# Patient Record
Sex: Male | Born: 1940 | Race: White | Hispanic: No | Marital: Married | Smoking: Current some day smoker
Health system: Southern US, Community
[De-identification: ages and names within clinical notes are randomized; demographics above are authoritative.]

## PROBLEM LIST (undated history)

## (undated) DIAGNOSIS — M169 Osteoarthritis of hip, unspecified: Secondary | ICD-10-CM

## (undated) DIAGNOSIS — G4734 Idiopathic sleep related nonobstructive alveolar hypoventilation: Secondary | ICD-10-CM

## (undated) DIAGNOSIS — I4892 Unspecified atrial flutter: Secondary | ICD-10-CM

## (undated) DIAGNOSIS — Z8619 Personal history of other infectious and parasitic diseases: Secondary | ICD-10-CM

## (undated) DIAGNOSIS — I2510 Atherosclerotic heart disease of native coronary artery without angina pectoris: Secondary | ICD-10-CM

## (undated) DIAGNOSIS — C801 Malignant (primary) neoplasm, unspecified: Secondary | ICD-10-CM

## (undated) DIAGNOSIS — I739 Peripheral vascular disease, unspecified: Secondary | ICD-10-CM

## (undated) DIAGNOSIS — I10 Essential (primary) hypertension: Secondary | ICD-10-CM

## (undated) DIAGNOSIS — F17210 Nicotine dependence, cigarettes, uncomplicated: Secondary | ICD-10-CM

## (undated) DIAGNOSIS — I499 Cardiac arrhythmia, unspecified: Secondary | ICD-10-CM

## (undated) DIAGNOSIS — F17200 Nicotine dependence, unspecified, uncomplicated: Secondary | ICD-10-CM

## (undated) DIAGNOSIS — J45909 Unspecified asthma, uncomplicated: Secondary | ICD-10-CM

## (undated) DIAGNOSIS — M1990 Unspecified osteoarthritis, unspecified site: Secondary | ICD-10-CM

## (undated) DIAGNOSIS — F419 Anxiety disorder, unspecified: Secondary | ICD-10-CM

## (undated) DIAGNOSIS — I4821 Permanent atrial fibrillation: Secondary | ICD-10-CM

## (undated) DIAGNOSIS — Z8679 Personal history of other diseases of the circulatory system: Secondary | ICD-10-CM

## (undated) DIAGNOSIS — K219 Gastro-esophageal reflux disease without esophagitis: Secondary | ICD-10-CM

## (undated) DIAGNOSIS — E785 Hyperlipidemia, unspecified: Secondary | ICD-10-CM

## (undated) DIAGNOSIS — J449 Chronic obstructive pulmonary disease, unspecified: Secondary | ICD-10-CM

## (undated) DIAGNOSIS — R05 Cough: Secondary | ICD-10-CM

## (undated) DIAGNOSIS — R0602 Shortness of breath: Secondary | ICD-10-CM

## (undated) DIAGNOSIS — R0989 Other specified symptoms and signs involving the circulatory and respiratory systems: Secondary | ICD-10-CM

## (undated) DIAGNOSIS — R55 Syncope and collapse: Secondary | ICD-10-CM

## (undated) HISTORY — PX: OTHER SURGICAL HISTORY: SHX169

## (undated) HISTORY — DX: Occlusion and stenosis of bilateral carotid arteries: I65.23

## (undated) HISTORY — PX: CERVICAL DISC SURGERY: SHX588

## (undated) HISTORY — DX: Cough: R05

## (undated) HISTORY — PX: CATARACT EXTRACTION: SUR2

## (undated) HISTORY — DX: Syncope and collapse: R55

## (undated) HISTORY — DX: Nicotine dependence, cigarettes, uncomplicated: F17.210

## (undated) HISTORY — PX: SINUS SURGERY WITH INSTATRAK: SHX5215

## (undated) HISTORY — DX: Personal history of other infectious and parasitic diseases: Z86.19

## (undated) HISTORY — DX: Anxiety disorder, unspecified: F41.9

## (undated) HISTORY — PX: BACK SURGERY: SHX140

## (undated) HISTORY — DX: Personal history of other diseases of the circulatory system: Z86.79

## (undated) HISTORY — DX: Unspecified atrial flutter: I48.92

## (undated) HISTORY — DX: Permanent atrial fibrillation: I48.21

## (undated) HISTORY — DX: Other specified symptoms and signs involving the circulatory and respiratory systems: R09.89

## (undated) HISTORY — DX: Idiopathic sleep related nonobstructive alveolar hypoventilation: G47.34

## (undated) HISTORY — PX: VASCULAR SURGERY: SHX849

## (undated) HISTORY — DX: Hyperlipidemia, unspecified: E78.5

## (undated) HISTORY — DX: Unspecified atherosclerosis of native arteries of extremities, bilateral legs: I70.203

## (undated) HISTORY — DX: Osteoarthritis of hip, unspecified: M16.9

## (undated) HISTORY — DX: Nicotine dependence, unspecified, uncomplicated: F17.200

## (undated) HISTORY — DX: Chronic obstructive pulmonary disease, unspecified: J44.9

---

## 2005-08-22 ENCOUNTER — Inpatient Hospital Stay (HOSPITAL_COMMUNITY): Admission: RE | Admit: 2005-08-22 | Discharge: 2005-08-23 | Payer: Self-pay | Admitting: Neurological Surgery

## 2005-08-29 ENCOUNTER — Encounter: Admission: RE | Admit: 2005-08-29 | Discharge: 2005-08-29 | Payer: Self-pay | Admitting: Neurological Surgery

## 2005-09-22 ENCOUNTER — Encounter: Admission: RE | Admit: 2005-09-22 | Discharge: 2005-09-22 | Payer: Self-pay | Admitting: Neurological Surgery

## 2005-11-03 ENCOUNTER — Encounter: Admission: RE | Admit: 2005-11-03 | Discharge: 2005-11-03 | Payer: Self-pay | Admitting: Neurological Surgery

## 2006-01-05 ENCOUNTER — Encounter: Admission: RE | Admit: 2006-01-05 | Discharge: 2006-01-05 | Payer: Self-pay | Admitting: Neurological Surgery

## 2006-03-05 ENCOUNTER — Ambulatory Visit (HOSPITAL_COMMUNITY): Admission: RE | Admit: 2006-03-05 | Discharge: 2006-03-06 | Payer: Self-pay | Admitting: Neurological Surgery

## 2007-01-27 IMAGING — RF DG CERVICAL SPINE 2 OR 3 VIEWS
1 series · 2 of 2 positions shown · non-contrast
Comparison: none

CLINICAL DATA: Herniated cervical disks.
 CERVICAL SPINE   - 2 VIEWS ? 08/22/05:
 Two lateral C-arm images of the cervical spine taken in the operating room.

[Series 1: run · 2 of 2 slices shown]
[im 1/2]
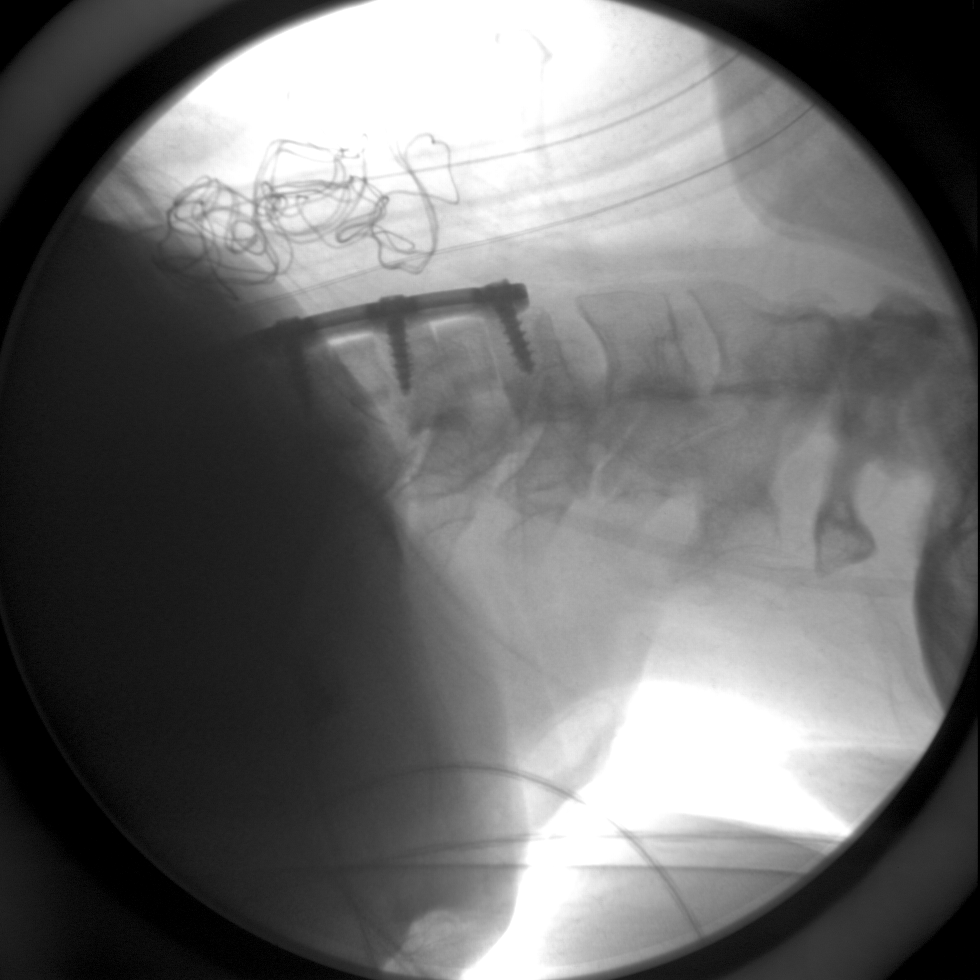
[im 2/2]
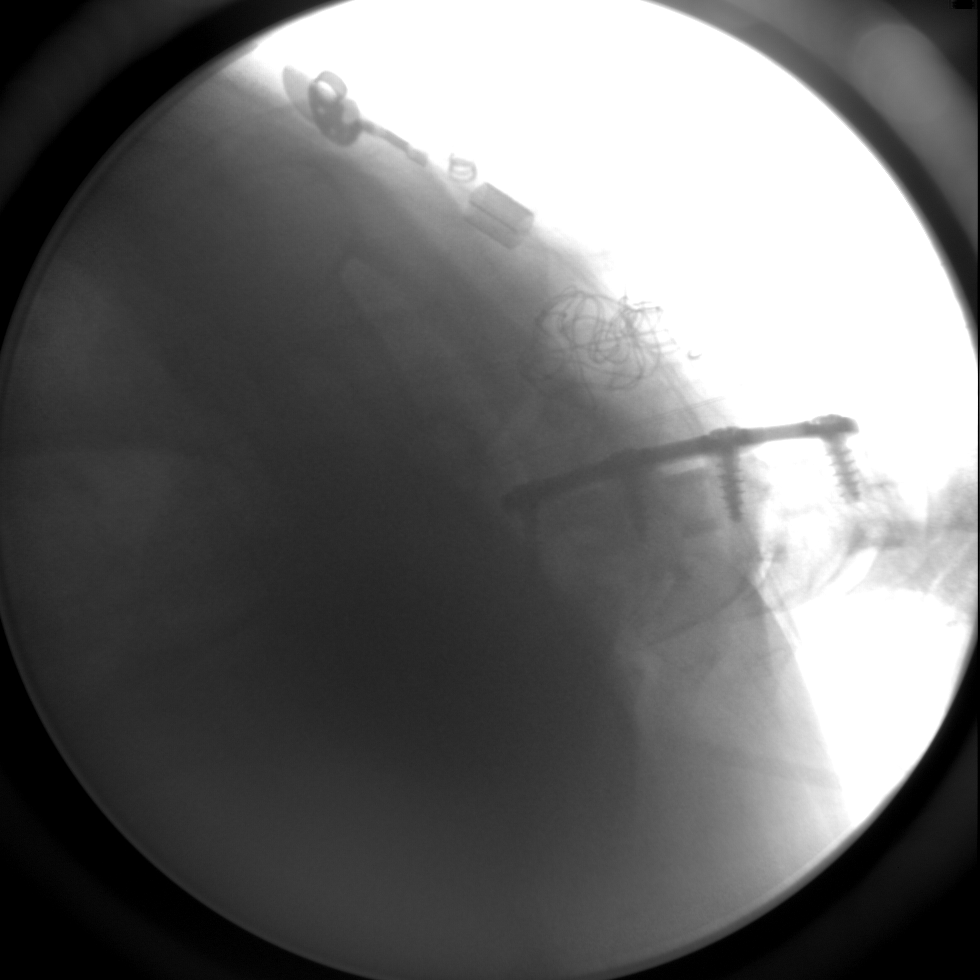

[2 of 2 positions shown; findings below may reference images not displayed]

FINDINGS: There is evidence of a three-level anterior cervical fusion extending from C4 to C7.  Anterior plate and plugs appear in good position with anatomic alignment of the cervical vertebrae.
IMPRESSION: Anterior cervical fusions performed at C4-5, C5-6, and C6-7.

## 2007-02-27 IMAGING — CR DG CERVICAL SPINE 1V
1 series · 1 of 1 positions shown · non-contrast
Comparison: 08/29/05

CLINICAL DATA: Surgery five weeks ago.  Some neck pain. 
 LATERAL CERVICAL SPINE:

[view not recorded]
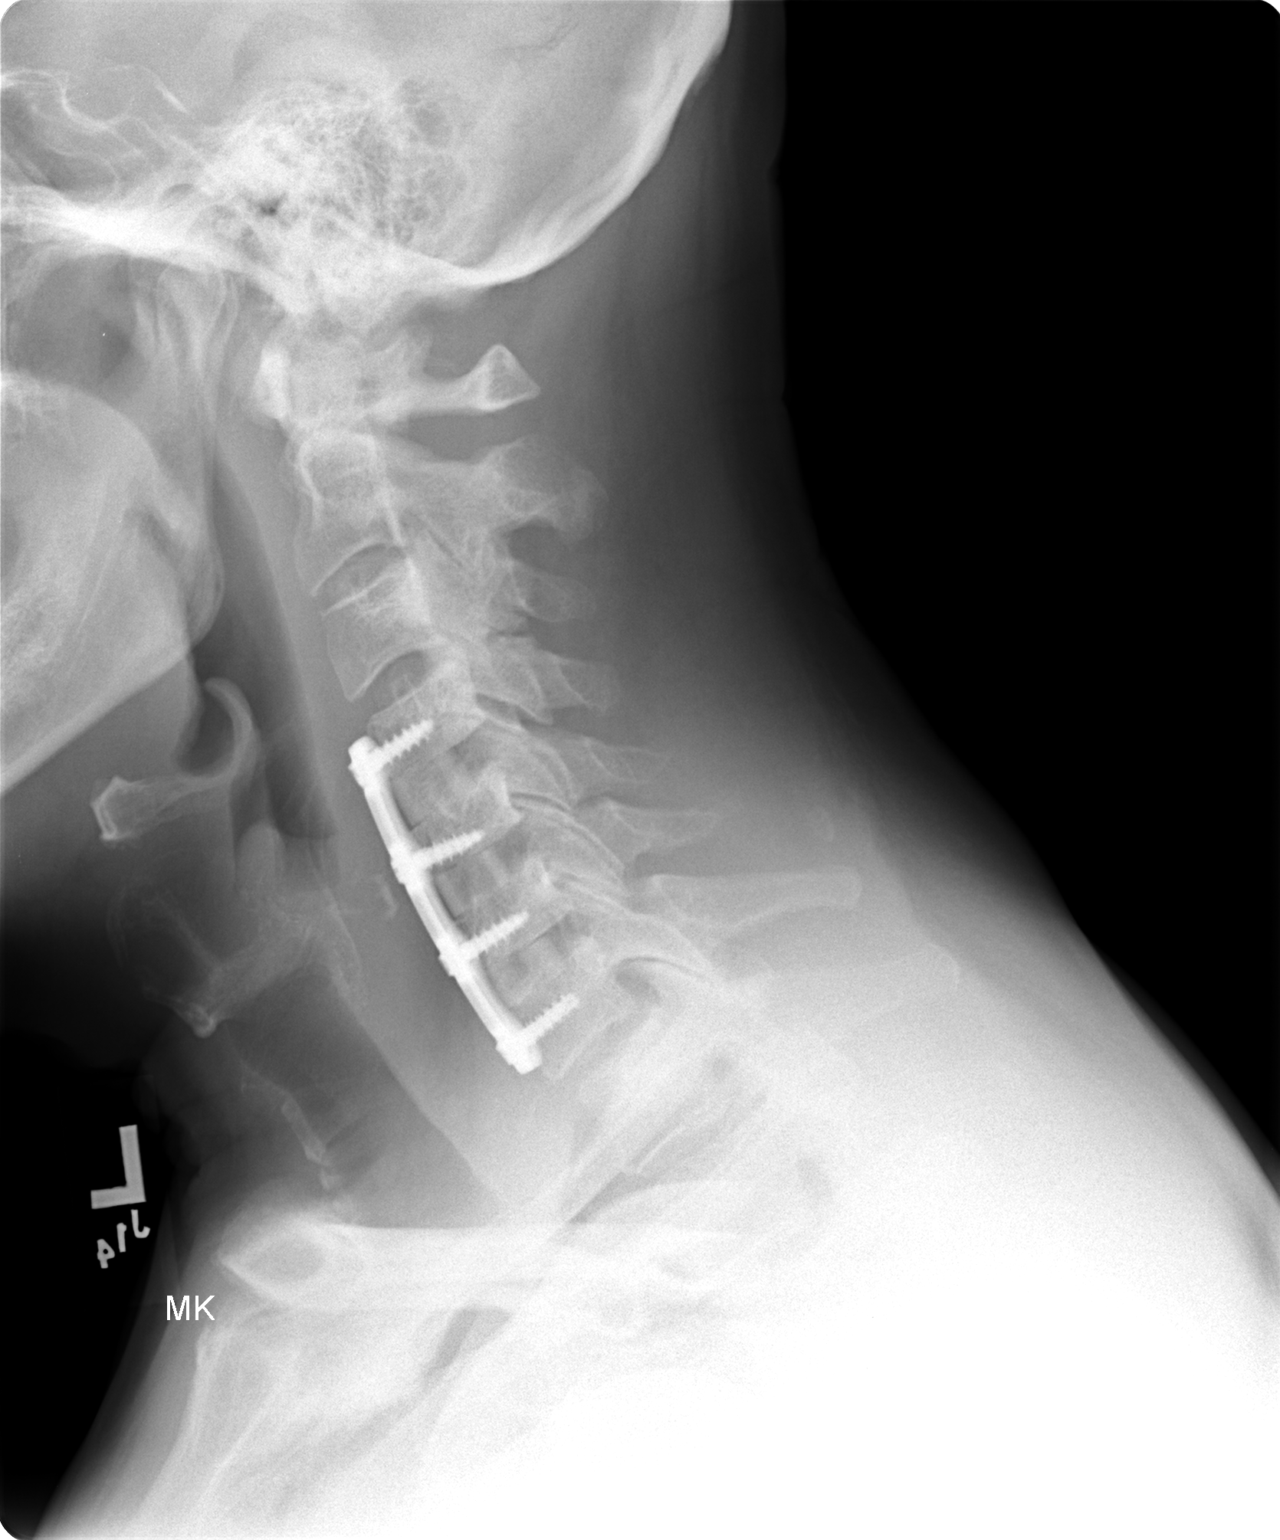

[1 of 1 positions shown; findings below may reference images not displayed]

FINDINGS: A lateral view of the cervical spine is compared to a film of 08/29/05.  An anterior fusion is again noted from C4 to C7.  The interbody fusion plugs are good in good position as is the anterior metallic fixation plate with normal alignment maintained.
IMPRESSION: Stable anterior fusion from C4 to C7.  Normal alignment.

## 2008-11-28 ENCOUNTER — Encounter (INDEPENDENT_AMBULATORY_CARE_PROVIDER_SITE_OTHER): Payer: Self-pay | Admitting: Orthopedic Surgery

## 2008-11-28 ENCOUNTER — Ambulatory Visit (HOSPITAL_BASED_OUTPATIENT_CLINIC_OR_DEPARTMENT_OTHER): Admission: RE | Admit: 2008-11-28 | Discharge: 2008-11-28 | Payer: Self-pay | Admitting: Orthopedic Surgery

## 2009-05-15 ENCOUNTER — Encounter: Admission: RE | Admit: 2009-05-15 | Discharge: 2009-05-15 | Payer: Self-pay | Admitting: Orthopedic Surgery

## 2009-05-31 ENCOUNTER — Ambulatory Visit (HOSPITAL_COMMUNITY): Admission: RE | Admit: 2009-05-31 | Discharge: 2009-06-01 | Payer: Self-pay | Admitting: Neurological Surgery

## 2010-09-24 LAB — BASIC METABOLIC PANEL
BUN: 13 mg/dL (ref 6–23)
CO2: 27 mEq/L (ref 19–32)
Calcium: 9.5 mg/dL (ref 8.4–10.5)
Chloride: 104 mEq/L (ref 96–112)
Creatinine, Ser: 0.95 mg/dL (ref 0.4–1.5)
GFR calc Af Amer: >60 mL/min (ref >60)
GFR calc non Af Amer: >60 mL/min (ref >60)
Glucose, Bld: 102 mg/dL — ABNORMAL HIGH (ref 70–99)
Potassium: 4 mEq/L (ref 3.5–5.1)
Sodium: 140 mEq/L (ref 135–145)

## 2010-09-24 LAB — DIFFERENTIAL
Basophils Absolute: 0.1 10*3/uL (ref 0.0–0.1)
Basophils Relative: 1 % (ref 0–1)
Eosinophils Absolute: 0.3 10*3/uL (ref 0.0–0.7)
Eosinophils Relative: 3 % (ref 0–5)
Lymphocytes Relative: 38 % (ref 12–46)
Lymphs Abs: 4 10*3/uL (ref 0.7–4.0)
Monocytes Absolute: 0.9 10*3/uL (ref 0.1–1.0)
Monocytes Relative: 9 % (ref 3–12)
Neutro Abs: 5.2 10*3/uL (ref 1.7–7.7)
Neutrophils Relative %: 50 % (ref 43–77)

## 2010-09-24 LAB — CBC
HCT: 44.6 % (ref 39.0–52.0)
Hemoglobin: 15.4 g/dL (ref 13.0–17.0)
MCHC: 34.6 g/dL (ref 30.0–36.0)
MCV: 97.9 fL (ref 78.0–100.0)
Platelets: 206 10*3/uL (ref 150–400)
RBC: 4.56 MIL/uL (ref 4.22–5.81)
RDW: 12.4 % (ref 11.5–15.5)
WBC: 10.5 10*3/uL (ref 4.0–10.5)

## 2010-09-24 LAB — PROTIME-INR
INR: 1.05 (ref 0.00–1.49)
Prothrombin Time: 13.6 seconds (ref 11.6–15.2)

## 2010-09-24 LAB — APTT: aPTT: 27 seconds (ref 24–37)

## 2010-09-30 LAB — POCT HEMOGLOBIN-HEMACUE: Hemoglobin: 14.4 g/dL (ref 13.0–17.0)

## 2010-11-05 IMAGING — CR DG CHEST 2V
2 series · 2 of 2 positions shown · non-contrast
Comparison: 08/21/2005.

CLINICAL DATA: Preop for lumbar radiculopathy.

CHEST - 2 VIEW

[view not recorded (1 of 2)]
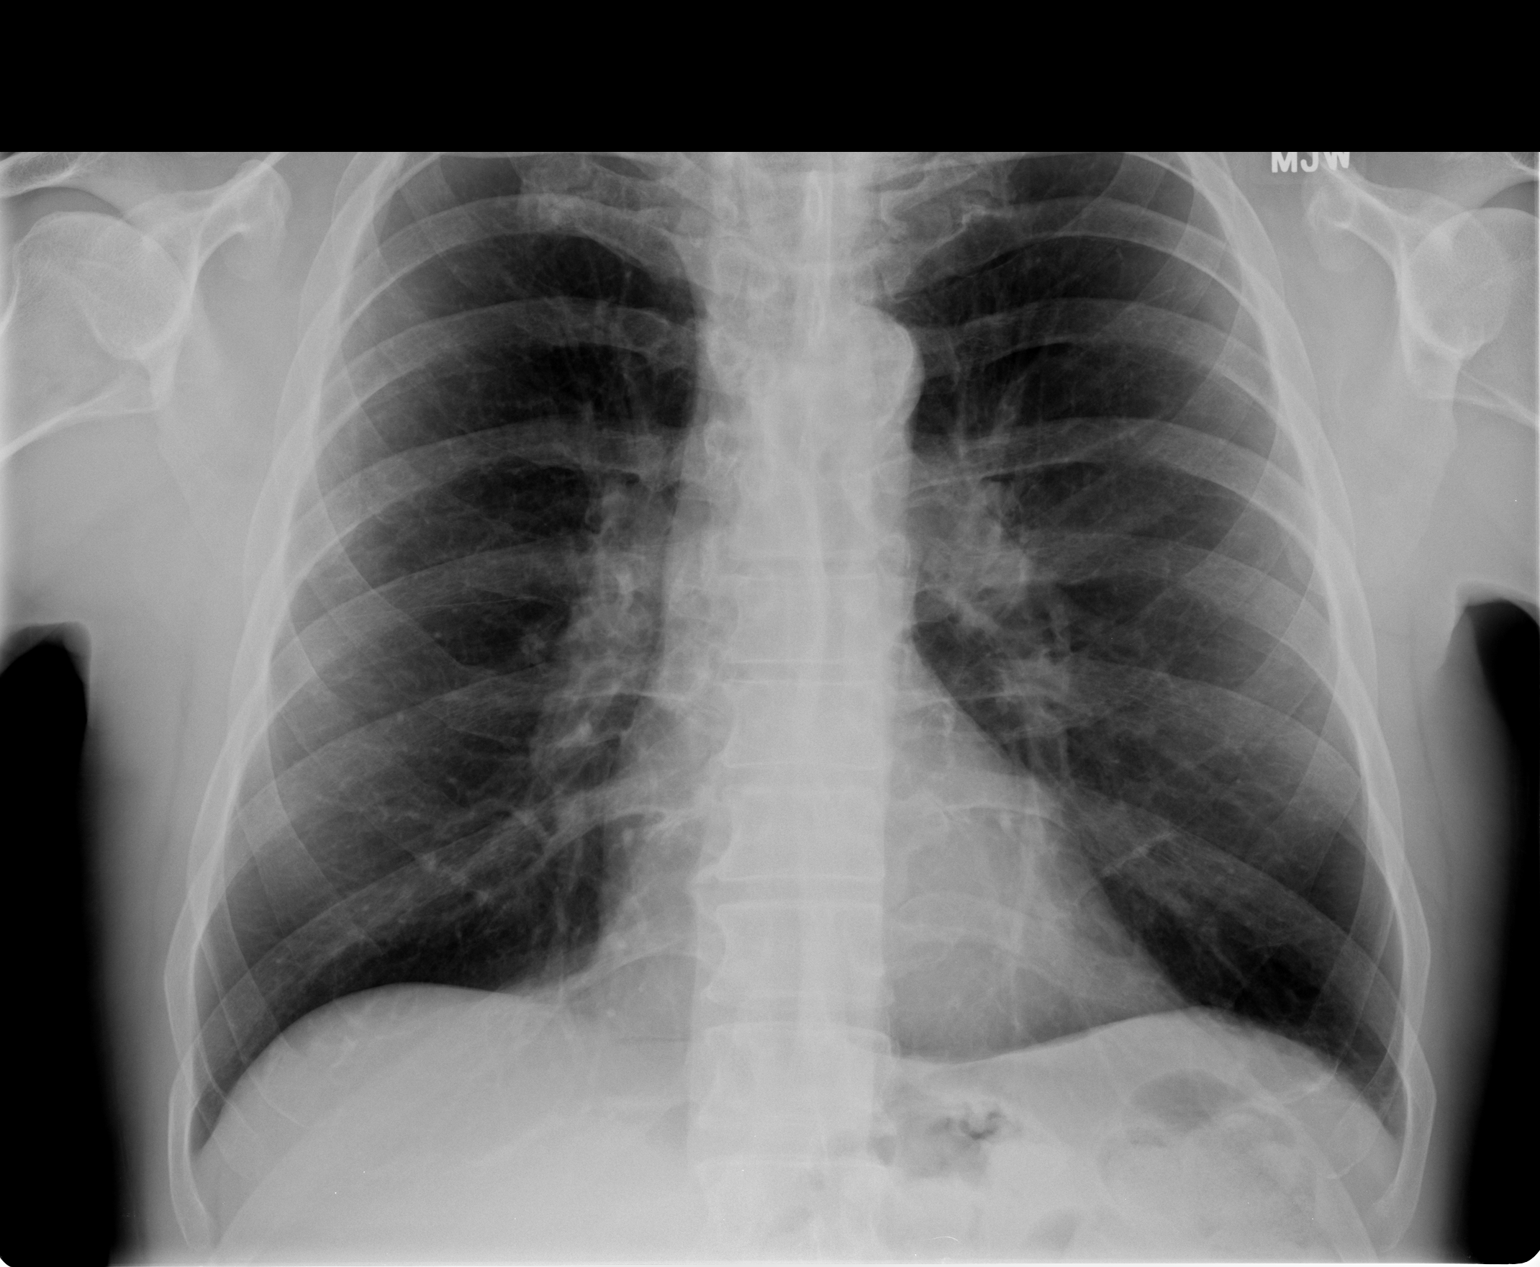

[view not recorded (2 of 2)]
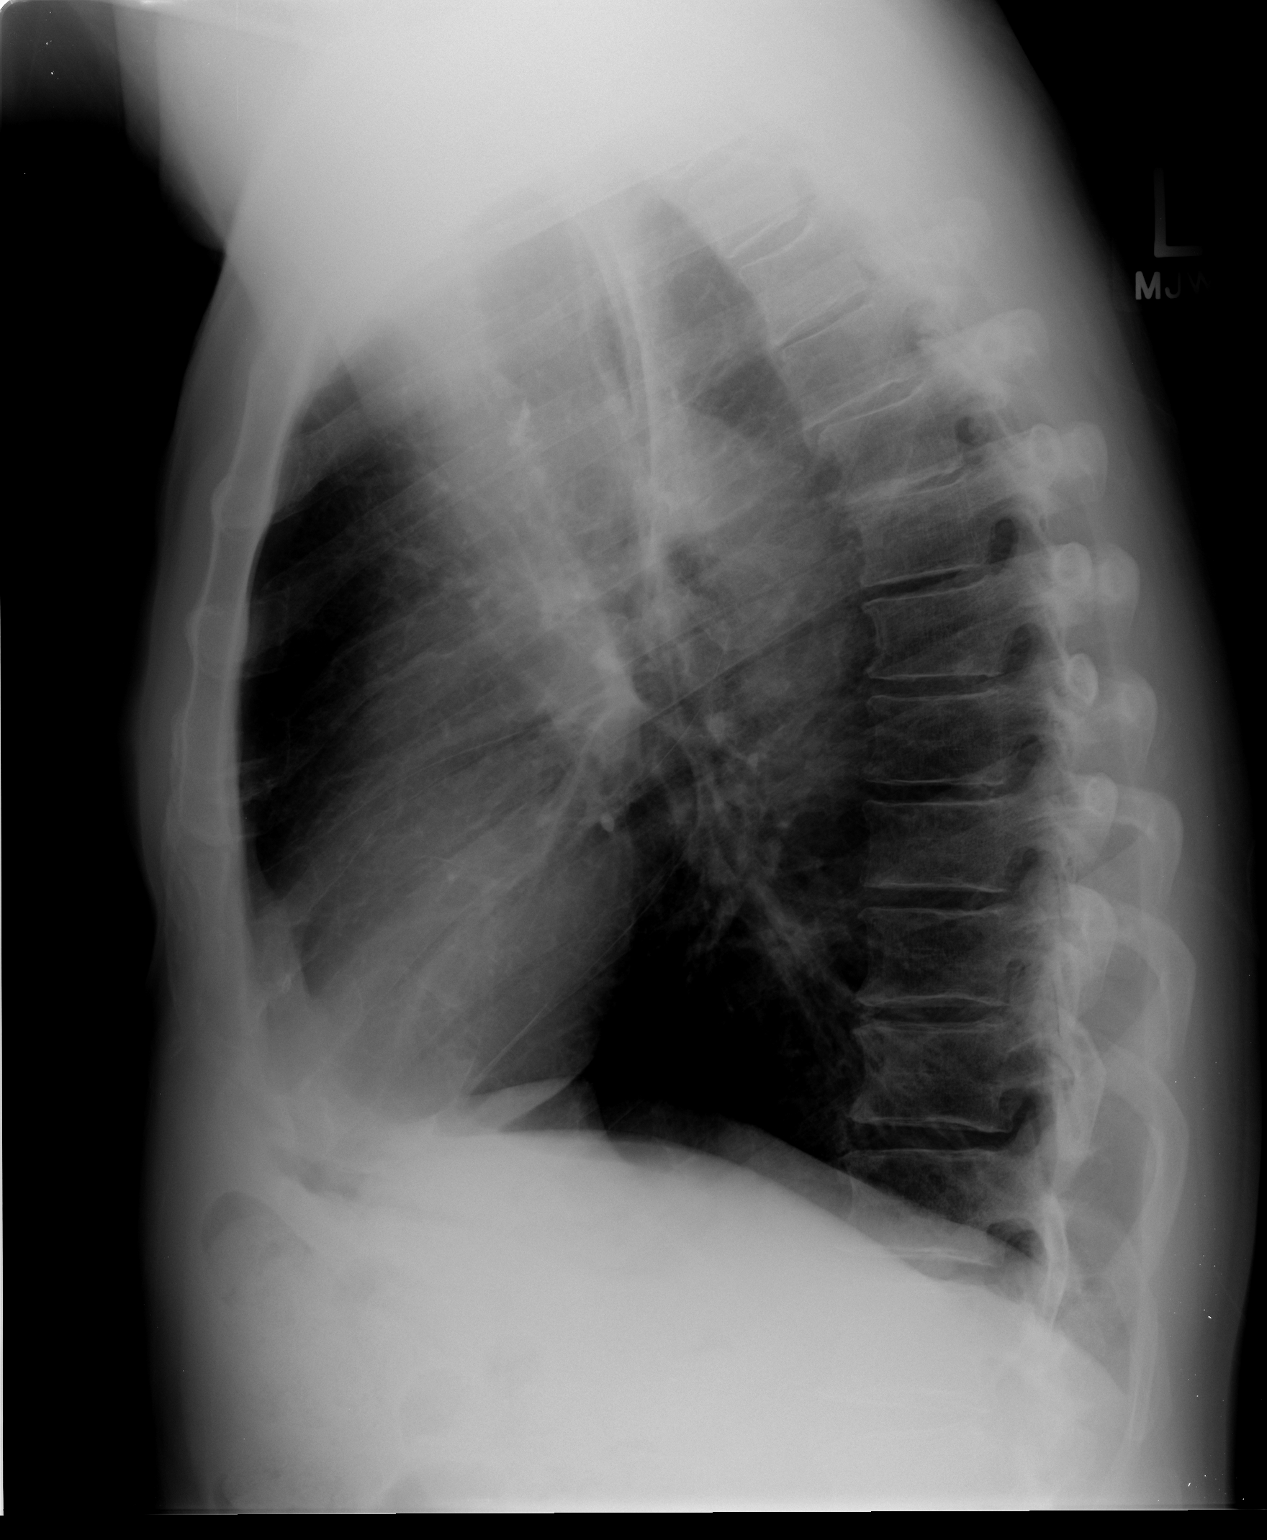

[2 of 2 positions shown; findings below may reference images not displayed]

FINDINGS: COPD changes again noted with prominence of the pulmonary
arteries bilaterally.  No active disease or interval change.
Osseous structures intact.
IMPRESSION: COPD - no active disease.

## 2010-11-05 IMAGING — CR DG LUMBAR SPINE 1V
1 series · 1 of 1 positions shown · non-contrast
Comparison: Lumbar spine MRI 05/15/2009.

CLINICAL DATA: Lumbar radiculopathy.  L5-S1 discectomy.

LUMBAR SPINE - 1 VIEW

[view not recorded]
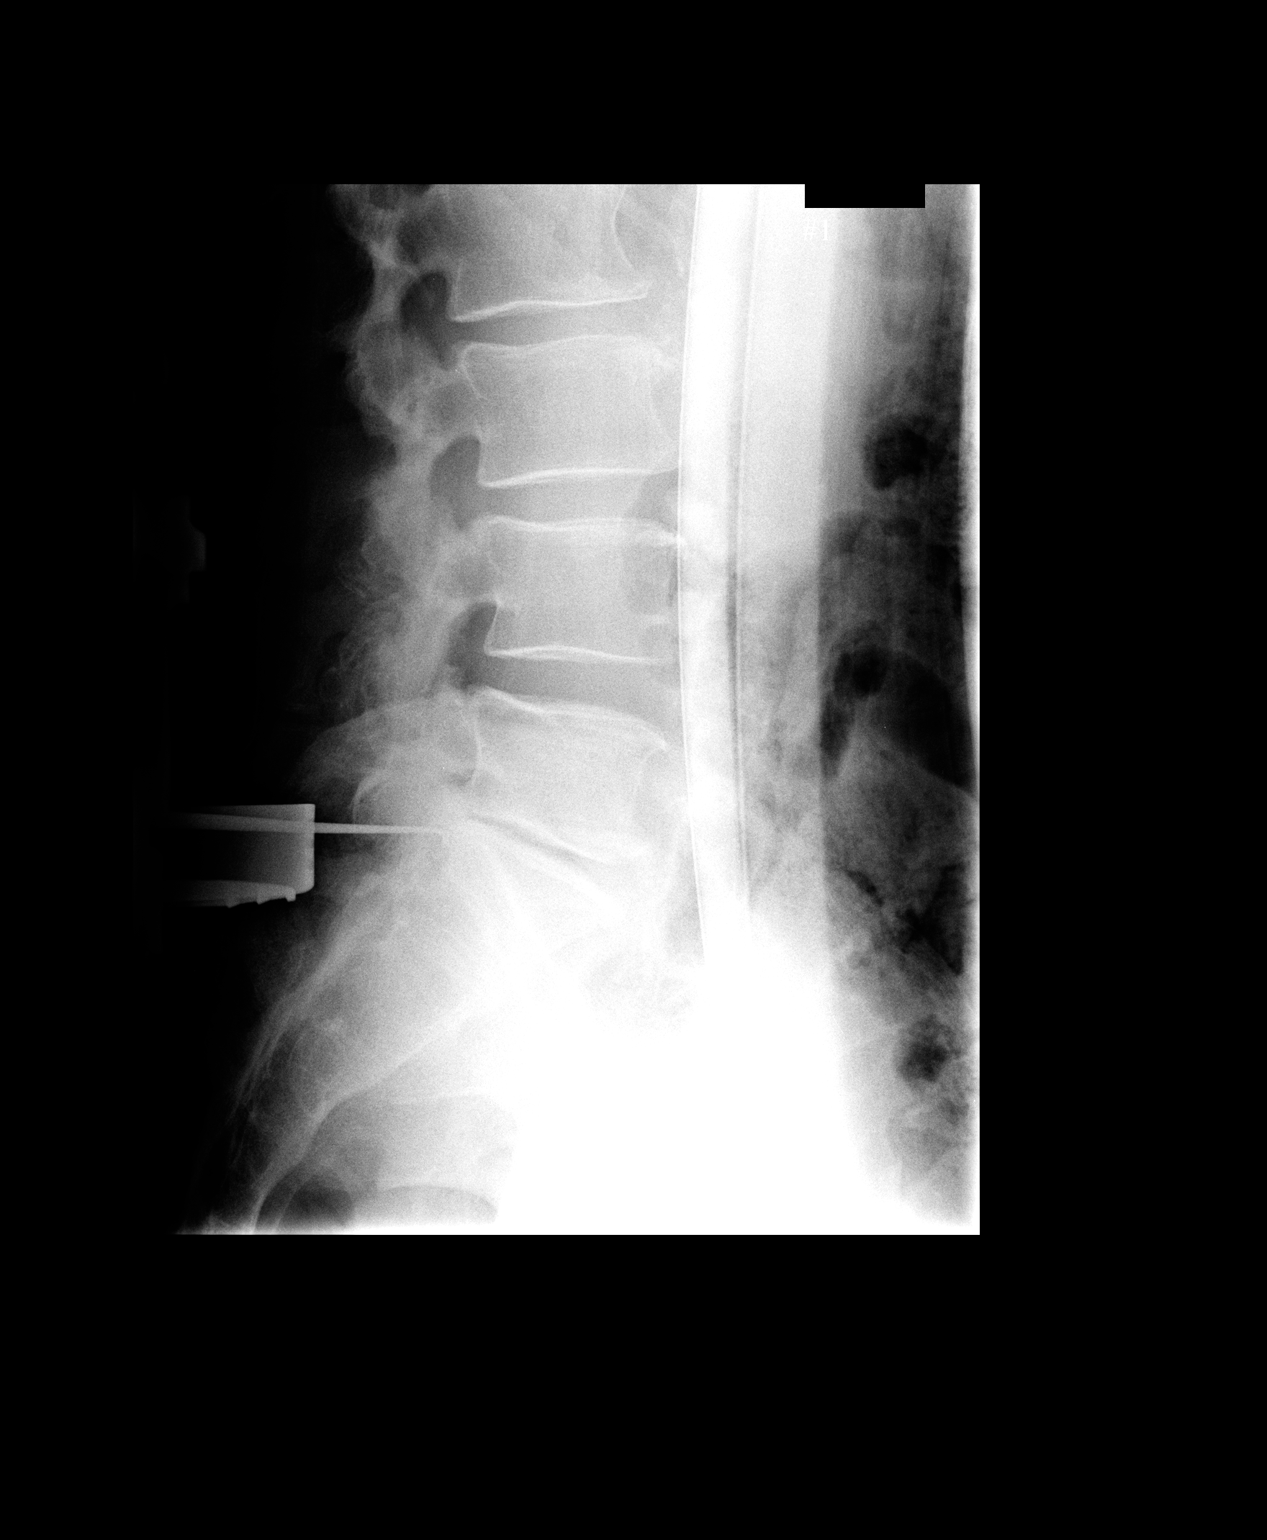

[1 of 1 positions shown; findings below may reference images not displayed]

FINDINGS: We are provided with a single intraoperative spot view of
the lumbar spine.  Image demonstrates a metallic probe in place
which localizes the L5-S1 level.
IMPRESSION: L5-S1 localization.

## 2010-11-05 NOTE — Op Note (Signed)
NAME:  Corey Tate, Corey Tate NO.:  [id number]   MEDICAL RECORD NO.:  [id number]          PATIENT TYPE:  AMB   LOCATION:  DSC                          FACILITY:  [facility]   PHYSICIAN:  Cindee Salt, M.D.       DATE OF BIRTH:  [birth year]   DATE OF PROCEDURE:  11/28/2008  DATE OF DISCHARGE:                               OPERATIVE REPORT   PREOPERATIVE DIAGNOSIS:  Olecranon bursa, left elbow.   POSTOPERATIVE DIAGNOSIS:  Olecranon bursa, left elbow.   OPERATION:  Excision of olecranon bursa, left elbow.   SURGEON:  Cindee Salt, MD   ASSISTANT:  Carolyne Fiscal, RN   ANESTHESIA:  Axillary block.   ANESTHESIOLOGIST:  Janetta Hora. Gelene Mink, MD   HISTORY:  The patient is a 70 year old male with a history of a large  olecranon bursa of his left elbow.  This has recurred following  drainage.  He is desirous having this removed in that it is causing  discomfort for him.  In the preoperative area, the patient is seen.  The  extremity is marked by both the patient and surgeon.  Antibiotic is  given.  Questions have been encouraged and answered.  He is aware of the  possibility of infection, recurrence injury to arteries, nerves, and  tendons, incomplete relief of symptoms, dystrophy, possibility of  numbness and tingling of the posterior aspect of his elbow, and  possibility of recurrence.   PROCEDURE:  The patient was brought to the operating room where an  axillary block was carried out without difficulty under the direction of  Dr. Gelene Mink.  He was prepped using for ChloraPrep, supine position,  left arm free.  A 3-minute dry time was taken.  The patient was then  draped.  A time-out proceeded with confirming the patient and procedure.  The limb was exsanguinated with an Esmarch bandage.  Tourniquet placed  high on the left upper arm was inflated to 270 mmHg.  A straight  incision was made posteriorly over the elbow and carried down through  the subcutaneous tissue.  A large thickened,  very firm olecranon bursa  was immediately encountered.  With blunt sharp dissection, this was  dissected free taking care to protect Osborne fascia.  The area was  debrided with a rongeur.  This was then copiously irrigated.  The  specimen was sent to pathology.  This was large about the size of a  lemon.  The area was then sprayed with thrombin in an effort to try to  prevent any bleeding and recurrence.  The wound was closed with figure-  of-eight 4-0 Vicryl Rapide sutures.  A sterile compressive dressing and  splint with the elbow  flexed 90 degrees was applied.  On deflation of the tourniquet, all  fingers immediately pinked.  He was taken to the recovery room for  observation in satisfactory condition.  He will be discharged home to  return the [facility] [place] in 1 week on Vicodin.           ______________________________  Cindee Salt, M.D.     GK/MEDQ  D:  11/28/2008  T:  11/29/2008  Job:  [id number]

## 2010-11-08 NOTE — Op Note (Signed)
NAME:  RAINEY, KAHRS NO.:  [id number]   MEDICAL RECORD NO.:  [id number]          PATIENT TYPE:  AMB   LOCATION:  SDS                          FACILITY:  [facility]   PHYSICIAN:  Tia Alert, MD     DATE OF BIRTH:  07/27/1940   DATE OF PROCEDURE:  03/05/2006  DATE OF DISCHARGE:                                 OPERATIVE REPORT   PREOPERATIVE DIAGNOSES:  1. 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  2. 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   POSTOPERATIVE DIAGNOSES:  1. 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  2. 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   PROCEDURES:  1. Redo hemilaminectomy, medial facetectomy, and foraminotomy L5-S1 on the      left followed by redo microdiskectomy L5-S1 on the left utilizing      microscopic dissection.  2. Decompressive lumbar hemilaminectomy, medial facetectomy, and      foraminotomies of L3-4 and L4-5 on the left followed by sublaminar      decompression for central canal and right lateral recess decompression      for spinal stenosis utilizing microscopic dissection.   SURGEON:  Dr. Marikay Alar.   ASSISTANT:  Donalee Citrin, M.D.   ANESTHESIA:  General endotracheal.   COMPLICATIONS:  None apparent.   INDICATIONS FOR THE PROCEDURE:  Mr. Arcidiacono is a 70 year old white male who was  referred with back and bilateral leg pain.  He had an MRI which showed  severe spinal stenosis at L3-4 and L4-5 with recurrent lumbar disk  herniation at L5-S1.  He had had a previous diskectomy years ago by another  Careers adviser.  I recommended a 2-level decompression at L3-4 and L4-5 followed by  redo microdiskectomy at L5-S1 on the left.  He understood the risks,  benefits, and expected outcome and wished to proceed.   DESCRIPTION OF PROCEDURE:  The patient was taken to the operating room and  after induction of adequate general endotracheal anesthesia, he was rolled  into  the prone position on the Wilson frame and all pressure points were  padded.  His lumbar region was prepped with DuraPrep and then draped in the  usual sterile fashion.  Five mL of local anesthesia was injected, and a  dorsal midline incision was made and carried down to the lumbosacral fascia.  The fascia was opened on the left side and taken down in a subperiosteal  fashion to expose L3-4, L4-5, and L5-S1.  Intraoperative x-ray confirmed my  level.  I started at L5-S1,  dissected through the scar tissue, found bony  elements and then widened the laminotomy utilizing a Kerrison punch until I  could find normal dura.  I was able to identify the shoulder of the S1 nerve  root.  There was significant scar tissue and what looked like an old  partially calcified free fragment which I was able to tease away from the  nerve root and remove with the combination of a 2-mm Kerrison punch and  pituitary rongeurs until the nerve root appeared  to be free and then de-  tethered the nerve root and utilizing microscopic dissection, I was able to  get into the disk space and remove a very small amount of disk from the  midline.  It was very difficult because of the tethering and the scar tissue  to retract the nerve medially to get a more medial exposure.  We could not  get through the axilla of the nerve root because of the old scar and  recurrent disk that was there.  We removed as much as we possibly could  safely and made sure the nerve root was de-tethered and free.  We could  follow the nerve root out into the foramen.  There, we felt it was safer to  go ahead and stop and move to the next level.  Therefore, I went to L3-4 and  L4-5 and at both levels used a combination of the high-speed drill and the  Kerrison punches to perform a hemilaminectomy, medial facetectomy and  foraminotomies.  I then used the drill to drill up under the spinous process  and performed a sublaminar decompression reaching  across the midline into  the other lateral recess to decompress.  I was able to identify the pedicle  and the disk space on the opposite side to assure adequate decompression of  the lateral recess and the opposite nerve roots.  The nerve roots were  identified and followed out into the foramina and then I palpated into the  foramina and into the midline with coronary dilators to make sure I had  adequate decompression of the nerve roots.  Once the decompression was  complete, I irrigated with saline solution containing bacitracin, dried all  bleeding points with bipolar cautery and Gelfoam.  I lined the dura with  Gelfoam and then closed the fascia with interrupted #1 Vicryl, closed the  subcutaneous and subcuticular tissues with 2-0 and 3-0 Vicryl, and closed  the skin with Benzoin and Steri-Strips.  The drapes were removed.  A sterile  dressing was applied.  The patient was awakened from general anesthesia and  transferred to the recovery room in stable condition.  At the end of  procedure, all sponge, needle, and instrument counts were correct.      Tia Alert, MD  Electronically Signed     DSJ/MEDQ  D:  03/05/2006  T:  03/06/2006  Job:  [id number]

## 2010-11-08 NOTE — Op Note (Signed)
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   POSTOPERATIVE DIAGNOSIS:  Cervical spondylosis with cervical spinal stenosis  C4-5, C5-6, C6-7, with neck pain and arm pain.   PROCEDURES:  1.  Decompressive anterior cervical diskectomy, C4-5, C5-6, C6-7, for      central canal and nerve root decompression  2.  Anterior cervical arthrodesis, C4-5, C5-6, C6-7, utilizing      corticocancellous allograft.  3.  Anterior cervical plating, C4 to C7 inclusive, utilizing a 62.5 -mm      Atlantis Vision plate.   SURGEON:  Tia Alert, M.D.   ASSISTANT:  Kathaleen Maser. Pool, M.D.   ANESTHESIA:  General endotracheal.   COMPLICATIONS:  None apparent.   INDICATIONS FOR PROCEDURE:  Corey Tate is a 70 year old white male who was  referred with neck pain and bilateral arm pain, left greater than right.  He  had an MRI which showed significant degenerative disk disease with cervical  spondylosis at C4-5, C5-6 and C6-7, with posterior osteophytic ridging and  some canal stenosis with foraminal stenosis.  He tried medical management,  including epidural steroid injections, without significant relief.  I  recommended a three-level ACDF with plating.  He understood the risks,  benefits and expected outcome and wished to proceed.   DESCRIPTION OF PROCEDURE:  The patient was taken to operating room and after  induction of adequate generalized endotracheal anesthesia, he was placed in  a supine position on the operating room table.  His right anterior cervical  region was prepped  with DuraPrep and then draped in the usual sterile  fashion.  Local anesthesia 5 mL was injected and an incision was made to the  right of midline in a transverse fashion and carried down to the platysma,  which was elevated, opened and undermined with Metzenbaum scissors.  I then  dissected a plane medial to the sternocleidomastoid muscle and internal  carotid artery and lateral to the trachea and esophagus to expose C4-5, C5-6  and C6-7.  The intraoperative fluoroscopy confirmed my level and then the  longus colli muscles were taken down bilaterally and the Shadow Line  retractors were placed under this to expose C4-5, C5-6 and C6-7 anteriorly.  He had large anterior osteophytes at all three levels.  These were removed  with a Leksell rongeur, and the annulus was incised at all three levels and  the initial diskectomy was done with pituitary rongeurs and curved curettes.  He had a lot of anterior osteophytosis, and this was removed with a 3-mm  Kerrison punch, Leksell rongeur and the high-speed drill to prepare for the  later  plating.  I then used the high-speed drill and I started at C5-6 level  and I drilled the endplates down to the level of the posterior longitudinal  ligament.  I then did the same thing at C4-5 and at C6-7, drilling the  endplates to prepare for later arthrodesis down to the level of the  posterior longitudinal ligament.  .  I then brought in the operating  microscope and using the operating microscope, I started at the C4-5 level,  opened the posterior longitudinal ligament with a nerve hook and then  removed it in a circumferential fashion along with the posterior  osteophytes, undercutting the body of C4 and the superior body of C5.  I did  bilateral foraminotomies, decompressing the C5 nerve root bilaterally.  The  pedicle was palpated.  The nerve roots were identified and followed out into  the foramen past the pedicle level.  Once the central canal  decompression  was completed and the dura looked capacious, I lined this with Gelfoam and  went to the C5-6 level and did the exact same thing.  We opened the  posterior longitudinal ligament with a nerve hook, then performed a  circumferential decompression with the Kerrison punch, underbiting the body  of C5 andC6 to decompress the central canal until the dura was quite  capacious.  The C6 nerve roots were identified bilaterally and decompressed  past the pedicle level.  We then did the same thing at C6-7, opened the  posterior longitudinal ligament and decompressing under the body of C6 and  the body of C7 with the Kerrison punch until the dura was capacious and no  longer pushed away from [place].  The C7 nerve root nerve roots were identified  and decompressed past the pedicle level.  I then dried the surgical bed with  Gelfoam, irrigated with saline solution, then measured each interspace.  We  had an 8-mm interspace to C4-5 and 7 mm interspaces at C5-6 and C6-7.  Corticocancellous allograft bone wedges were then tapped into position at C4-  5, C5-6 and C6-7.  I then used a 62.5 mm Atlantis Vision plate and placed  two 13 mm variable-angle screws into the bodies of C4, C5, C6 and C7 and  locked these into position with the locking mechanism on the plate.  I then  irrigated with saline solution containing bacitracin, dried all bleeding  points with bipolar cautery and once meticulous hemostasis was achieved and  the construct was checked under fluoroscopy, we closed the platysma with 3-0  Vicryl.  We closed the subcuticular tissue with 3-0 Vicryl and closed the  skin with Benzoin and Steri-Strips.  The drapes removed.  A sterile dressing  was applied.  The patient was awakened from general anesthesia and  transferred to the recovery room in stable condition. At the end of  procedure all sponge, needle and instrument counts were correct.      Tia Alert, MD Electronically  Signed     DSJ/MEDQ  D:  08/22/2005  T:  08/23/2005  Job:  [id number]

## 2012-03-09 ENCOUNTER — Other Ambulatory Visit (HOSPITAL_COMMUNITY): Payer: Self-pay | Admitting: Orthopaedic Surgery

## 2012-03-25 ENCOUNTER — Encounter (HOSPITAL_COMMUNITY): Payer: Self-pay | Admitting: Pharmacy Technician

## 2012-03-29 ENCOUNTER — Encounter (HOSPITAL_COMMUNITY): Payer: Self-pay

## 2012-03-29 ENCOUNTER — Ambulatory Visit (HOSPITAL_COMMUNITY)
Admission: RE | Admit: 2012-03-29 | Discharge: 2012-03-29 | Disposition: A | Discharge status: Home / Self Care | Payer: Medicare Other | Source: Ambulatory Visit | Attending: Orthopaedic Surgery | Admitting: Orthopaedic Surgery

## 2012-03-29 ENCOUNTER — Encounter (HOSPITAL_COMMUNITY)
Admission: RE | Admit: 2012-03-29 | Discharge: 2012-03-29 | Disposition: A | Discharge status: Home / Self Care | Payer: Medicare Other | Source: Ambulatory Visit | Attending: Orthopaedic Surgery | Admitting: Orthopaedic Surgery

## 2012-03-29 DIAGNOSIS — Z01810 Encounter for preprocedural cardiovascular examination: Secondary | ICD-10-CM | POA: Insufficient documentation

## 2012-03-29 DIAGNOSIS — M169 Osteoarthritis of hip, unspecified: Secondary | ICD-10-CM | POA: Insufficient documentation

## 2012-03-29 DIAGNOSIS — Z01812 Encounter for preprocedural laboratory examination: Secondary | ICD-10-CM | POA: Insufficient documentation

## 2012-03-29 DIAGNOSIS — M1610 Unilateral primary osteoarthritis, unspecified hip: Secondary | ICD-10-CM | POA: Insufficient documentation

## 2012-03-29 HISTORY — DX: Gastro-esophageal reflux disease without esophagitis: K21.9

## 2012-03-29 HISTORY — DX: Malignant (primary) neoplasm, unspecified: C80.1

## 2012-03-29 HISTORY — DX: Essential (primary) hypertension: I10

## 2012-03-29 HISTORY — DX: Shortness of breath: R06.02

## 2012-03-29 HISTORY — DX: Unspecified osteoarthritis, unspecified site: M19.90

## 2012-03-29 HISTORY — DX: Cardiac arrhythmia, unspecified: I49.9

## 2012-03-29 HISTORY — DX: Peripheral vascular disease, unspecified: I73.9

## 2012-03-29 LAB — SURGICAL PCR SCREEN
MRSA, PCR: INVALID — AB
Staphylococcus aureus: INVALID — AB

## 2012-03-29 LAB — BASIC METABOLIC PANEL
BUN: 11 mg/dL (ref 6–23)
CO2: 29 mEq/L (ref 19–32)
Calcium: 11 mg/dL — ABNORMAL HIGH (ref 8.4–10.5)
Chloride: 96 mEq/L (ref 96–112)
Creatinine, Ser: 1.02 mg/dL (ref 0.50–1.35)
GFR calc Af Amer: 85 mL/min — ABNORMAL LOW (ref >90)
GFR calc non Af Amer: 73 mL/min — ABNORMAL LOW (ref >90)
Glucose, Bld: 122 mg/dL — ABNORMAL HIGH (ref 70–99)
Potassium: 5.1 mEq/L (ref 3.5–5.1)
Sodium: 134 mEq/L — ABNORMAL LOW (ref 135–145)

## 2012-03-29 LAB — CBC
HCT: 41 % (ref 39.0–52.0)
Hemoglobin: 14.6 g/dL (ref 13.0–17.0)
MCH: 33.2 pg (ref 26.0–34.0)
MCHC: 35.6 g/dL (ref 30.0–36.0)
MCV: 93.2 fL (ref 78.0–100.0)
Platelets: 255 10*3/uL (ref 150–400)
RBC: 4.4 MIL/uL (ref 4.22–5.81)
RDW: 12 % (ref 11.5–15.5)
WBC: 8.8 10*3/uL (ref 4.0–10.5)

## 2012-03-29 LAB — URINALYSIS, ROUTINE W REFLEX MICROSCOPIC
Bilirubin Urine: NEGATIVE
Glucose, UA: NEGATIVE mg/dL
Hgb urine dipstick: NEGATIVE
Ketones, ur: NEGATIVE mg/dL
Leukocytes, UA: NEGATIVE
Nitrite: NEGATIVE
Protein, ur: NEGATIVE mg/dL
Specific Gravity, Urine: 1.008 (ref 1.005–1.030)
Urobilinogen, UA: 0.2 mg/dL (ref 0.0–1.0)
pH: 6 (ref 5.0–8.0)

## 2012-03-29 LAB — ABO/RH: ABO/RH(D): O POS

## 2012-03-29 LAB — PROTIME-INR
INR: 0.99 (ref 0.00–1.49)
Prothrombin Time: 13 seconds (ref 11.6–15.2)

## 2012-03-29 LAB — APTT: aPTT: 30 seconds (ref 24–37)

## 2012-03-29 NOTE — Patient Instructions (Signed)
YOUR SURGERY IS SCHEDULED AT [facility]  ON:  Friday  10/11  AT 1:25 PM  REPORT TO [place] [facility]ORT STAY [facility] AT:  11:15 AM      PHONE # FOR SHORT STAY IS [phone]  DO NOT EAT  ANYTHING AFTER MIDNIGHT THE NIGHT BEFORE YOUR SURGERY.   NO FOOD, NO CHEWING GUM, NO MINTS, NO CANDIES, NO CHEWING TOBACCO.   YOU MAY HAVE CLEAR LIQUIDS TO DRINK FROM MIDNIGHT UNTIL 7:15 AM DAY OF SURGERY  -- LIKE WATER AND BLACK COFFEE.   NOTHING TO DRINK AFTER 7:15 AM DAY OF SURGERY.  PLEASE TAKE THE FOLLOWING MEDICATIONS THE AM OF YOUR SURGERY WITH A FEW SIPS OF WATER:   AMLODIPINE AND HYDROCODINE.  USE YOUR ALBUTEROL INHALER AND BRING YOUR INHALER TO TAKE TO SURGERY.   IF YOU USE INHALERS--USE YOUR INHALERS THE AM OF YOUR SURGERY AND BRING INHALERS TO THE HOSPITAL -TAKE TO SURGERY.    IF YOU ARE DIABETIC:  DO NOT TAKE ANY DIABETIC MEDICATIONS THE AM OF YOUR SURGERY.  IF YOU TAKE INSULIN IN THE EVENINGS--PLEASE ONLY TAKE 1/2 NORMAL EVENING DOSE THE NIGHT BEFORE YOUR SURGERY.  NO INSULIN THE AM OF YOUR SURGERY.  IF YOU HAVE SLEEP APNEA AND USE CPAP OR BIPAP--PLEASE BRING THE MASK AND THE TUBING.  DO NOT BRING YOUR MACHINE.  DO NOT BRING VALUABLES, MONEY, CREDIT CARDS.  DO NOT WEAR JEWELRY, MAKE-UP, NAIL POLISH AND NO METAL PINS OR CLIPS IN YOUR HAIR. CONTACT LENS, DENTURES / PARTIALS, GLASSES SHOULD NOT BE WORN TO SURGERY AND IN MOST CASES-HEARING AIDS WILL NEED TO BE REMOVED.  BRING YOUR GLASSES CASE, ANY EQUIPMENT NEEDED FOR YOUR CONTACT LENS. FOR PATIENTS ADMITTED TO THE HOSPITAL--CHECK OUT TIME THE DAY OF DISCHARGE IS 11:00 AM.  ALL INPATIENT ROOMS ARE PRIVATE - WITH BATHROOM, TELEPHONE, TELEVISION AND WIFI INTERNET.  IF YOU ARE BEING DISCHARGED THE SAME DAY OF YOUR SURGERY--YOU CAN NOT DRIVE YOURSELF HOME--AND SHOULD NOT GO HOME ALONE BY TAXI OR BUS.  NO DRIVING OR OPERATING MACHINERY FOR 24 HOURS FOLLOWING ANESTHESIA / PAIN MEDICATIONS.  PLEASE MAKE ARRANGEMENTS FOR SOMEONE TO BE WITH YOU AT HOME  THE FIRST 24 HOURS AFTER SURGERY. RESPONSIBLE DRIVER'S NAME___________________________                                               PHONE #   _______________________                                  PLEASE READ OVER ANY  FACT SHEETS THAT YOU WERE GIVEN: MRSA INFORMATION, BLOOD TRANSFUSION INFORMATION.

## 2012-03-29 NOTE — Pre-Procedure Instructions (Signed)
PCR, CBC, BMET, PT, PTT, UA, T/S, EKG, CXR WERE DONE TODAY - PREOP -AT [facility] AS PER ORDERS DR. Maureen Ralphs AND ANESTHESIOLOGIST'S GUIDELINES.

## 2012-04-01 LAB — MRSA CULTURE

## 2012-04-01 MED ORDER — CEFAZOLIN SODIUM-DEXTROSE 2-3 GM-% IV SOLR
2.0000 g | INTRAVENOUS | Status: AC
  Administered 2012-04-02: 2 g via INTRAVENOUS

## 2012-04-02 ENCOUNTER — Inpatient Hospital Stay (HOSPITAL_COMMUNITY)
Admission: RE | Admit: 2012-04-02 | Discharge: 2012-04-05 | DRG: 470 | Disposition: A | Discharge status: Home Health | Payer: Medicare Other | Source: Ambulatory Visit | Attending: Orthopaedic Surgery | Admitting: Orthopaedic Surgery

## 2012-04-02 ENCOUNTER — Inpatient Hospital Stay (HOSPITAL_COMMUNITY): Payer: Medicare Other

## 2012-04-02 ENCOUNTER — Inpatient Hospital Stay (HOSPITAL_COMMUNITY): Payer: Medicare Other | Admitting: Anesthesiology

## 2012-04-02 ENCOUNTER — Encounter (HOSPITAL_COMMUNITY): Payer: Self-pay | Admitting: Anesthesiology

## 2012-04-02 ENCOUNTER — Encounter (HOSPITAL_COMMUNITY): Payer: Self-pay | Admitting: *Deleted

## 2012-04-02 ENCOUNTER — Encounter (HOSPITAL_COMMUNITY)
Admission: RE | Disposition: A | Discharge status: Home Health | Payer: Self-pay | Source: Ambulatory Visit | Attending: Orthopaedic Surgery

## 2012-04-02 DIAGNOSIS — Z79899 Other long term (current) drug therapy: Secondary | ICD-10-CM

## 2012-04-02 DIAGNOSIS — R0602 Shortness of breath: Secondary | ICD-10-CM | POA: Diagnosis present

## 2012-04-02 DIAGNOSIS — I499 Cardiac arrhythmia, unspecified: Secondary | ICD-10-CM | POA: Diagnosis present

## 2012-04-02 DIAGNOSIS — K219 Gastro-esophageal reflux disease without esophagitis: Secondary | ICD-10-CM | POA: Diagnosis present

## 2012-04-02 DIAGNOSIS — M169 Osteoarthritis of hip, unspecified: Secondary | ICD-10-CM

## 2012-04-02 DIAGNOSIS — D62 Acute posthemorrhagic anemia: Secondary | ICD-10-CM | POA: Diagnosis not present

## 2012-04-02 DIAGNOSIS — Z9981 Dependence on supplemental oxygen: Secondary | ICD-10-CM

## 2012-04-02 DIAGNOSIS — I10 Essential (primary) hypertension: Secondary | ICD-10-CM | POA: Diagnosis present

## 2012-04-02 DIAGNOSIS — F17200 Nicotine dependence, unspecified, uncomplicated: Secondary | ICD-10-CM | POA: Diagnosis present

## 2012-04-02 DIAGNOSIS — I739 Peripheral vascular disease, unspecified: Secondary | ICD-10-CM | POA: Diagnosis present

## 2012-04-02 DIAGNOSIS — Z85828 Personal history of other malignant neoplasm of skin: Secondary | ICD-10-CM

## 2012-04-02 DIAGNOSIS — Z881 Allergy status to other antibiotic agents status: Secondary | ICD-10-CM

## 2012-04-02 DIAGNOSIS — M1610 Unilateral primary osteoarthritis, unspecified hip: Principal | ICD-10-CM | POA: Diagnosis present

## 2012-04-02 HISTORY — PX: TOTAL HIP ARTHROPLASTY: SHX124

## 2012-04-02 HISTORY — DX: Osteoarthritis of hip, unspecified: M16.9

## 2012-04-02 LAB — TYPE AND SCREEN
ABO/RH(D): O POS
Antibody Screen: NEGATIVE

## 2012-04-02 SURGERY — ARTHROPLASTY, HIP, TOTAL, ANTERIOR APPROACH
Anesthesia: Spinal | Site: Hip | Laterality: Right | Wound class: Clean

## 2012-04-02 MED ORDER — FERROUS SULFATE 325 (65 FE) MG PO TABS
325.0000 mg | ORAL_TABLET | Freq: Three times a day (TID) | ORAL | Status: DC
  Administered 2012-04-02 – 2012-04-05 (×8): 325 mg via ORAL
  Filled 2012-04-02 (×11): qty 1

## 2012-04-02 MED ORDER — ACETAMINOPHEN 325 MG PO TABS: 650.0000 mg | ORAL_TABLET | Freq: Four times a day (QID) | ORAL | Status: DC | PRN

## 2012-04-02 MED ORDER — DOCUSATE SODIUM 100 MG PO CAPS
100.0000 mg | ORAL_CAPSULE | Freq: Two times a day (BID) | ORAL | Status: DC
  Administered 2012-04-02 – 2012-04-05 (×6): 100 mg via ORAL

## 2012-04-02 MED ORDER — ACETAMINOPHEN 10 MG/ML IV SOLN
INTRAVENOUS | Status: DC | PRN
  Administered 2012-04-02: 1000 mg via INTRAVENOUS

## 2012-04-02 MED ORDER — ONDANSETRON HCL 4 MG PO TABS: 4.0000 mg | ORAL_TABLET | Freq: Four times a day (QID) | ORAL | Status: DC | PRN

## 2012-04-02 MED ORDER — KETOROLAC TROMETHAMINE 15 MG/ML IJ SOLN
7.5000 mg | Freq: Four times a day (QID) | INTRAMUSCULAR | Status: AC
  Administered 2012-04-02 – 2012-04-03 (×4): 7.5 mg via INTRAVENOUS
  Filled 2012-04-02 (×4): qty 1

## 2012-04-02 MED ORDER — CEFAZOLIN SODIUM 1-5 GM-% IV SOLN
1.0000 g | Freq: Four times a day (QID) | INTRAVENOUS | Status: AC
  Administered 2012-04-02 – 2012-04-03 (×2): 1 g via INTRAVENOUS
  Filled 2012-04-02 (×2): qty 50

## 2012-04-02 MED ORDER — ASPIRIN EC 325 MG PO TBEC
325.0000 mg | DELAYED_RELEASE_TABLET | Freq: Two times a day (BID) | ORAL | Status: DC
  Administered 2012-04-02 – 2012-04-03 (×3): 325 mg via ORAL
  Filled 2012-04-02 (×4): qty 1

## 2012-04-02 MED ORDER — PROPOFOL INFUSION 10 MG/ML OPTIME
INTRAVENOUS | Status: DC | PRN
  Administered 2012-04-02: 120 ug/kg/min via INTRAVENOUS

## 2012-04-02 MED ORDER — HYDROMORPHONE HCL PF 1 MG/ML IJ SOLN
1.0000 mg | INTRAMUSCULAR | Status: DC | PRN
  Administered 2012-04-02: 1 mg via INTRAVENOUS
  Filled 2012-04-02: qty 1

## 2012-04-02 MED ORDER — LACTATED RINGERS IV SOLN: INTRAVENOUS | Status: DC

## 2012-04-02 MED ORDER — METOCLOPRAMIDE HCL 5 MG/ML IJ SOLN: 5.0000 mg | Freq: Three times a day (TID) | INTRAMUSCULAR | Status: DC | PRN

## 2012-04-02 MED ORDER — METHOCARBAMOL 100 MG/ML IJ SOLN
500.0000 mg | Freq: Four times a day (QID) | INTRAMUSCULAR | Status: DC | PRN
  Administered 2012-04-02: 500 mg via INTRAVENOUS
  Filled 2012-04-02 (×2): qty 5

## 2012-04-02 MED ORDER — ACETAMINOPHEN 650 MG RE SUPP: 650.0000 mg | Freq: Four times a day (QID) | RECTAL | Status: DC | PRN

## 2012-04-02 MED ORDER — MENTHOL 3 MG MT LOZG
1.0000 | LOZENGE | OROMUCOSAL | Status: DC | PRN
  Filled 2012-04-02: qty 9

## 2012-04-02 MED ORDER — HETASTARCH-ELECTROLYTES 6 % IV SOLN
INTRAVENOUS | Status: DC | PRN
  Administered 2012-04-02: 13:00:00 via INTRAVENOUS

## 2012-04-02 MED ORDER — FENTANYL CITRATE 0.05 MG/ML IJ SOLN
INTRAMUSCULAR | Status: DC | PRN
  Administered 2012-04-02: 50 ug via INTRAVENOUS

## 2012-04-02 MED ORDER — MEPERIDINE HCL 50 MG/ML IJ SOLN: 6.2500 mg | INTRAMUSCULAR | Status: DC | PRN

## 2012-04-02 MED ORDER — LACTATED RINGERS IV SOLN
INTRAVENOUS | Status: DC | PRN
  Administered 2012-04-02 (×3): via INTRAVENOUS

## 2012-04-02 MED ORDER — AMLODIPINE BESYLATE 5 MG PO TABS
5.0000 mg | ORAL_TABLET | Freq: Every day | ORAL | Status: DC
  Administered 2012-04-05: 5 mg via ORAL
  Filled 2012-04-02 (×3): qty 1

## 2012-04-02 MED ORDER — ONDANSETRON HCL 4 MG/2ML IJ SOLN: 4.0000 mg | Freq: Four times a day (QID) | INTRAMUSCULAR | Status: DC | PRN

## 2012-04-02 MED ORDER — HYDROMORPHONE HCL PF 1 MG/ML IJ SOLN: 0.2500 mg | INTRAMUSCULAR | Status: DC | PRN

## 2012-04-02 MED ORDER — PROMETHAZINE HCL 25 MG/ML IJ SOLN: 6.2500 mg | INTRAMUSCULAR | Status: DC | PRN

## 2012-04-02 MED ORDER — ONDANSETRON HCL 4 MG/2ML IJ SOLN
INTRAMUSCULAR | Status: DC | PRN
  Administered 2012-04-02: 4 mg via INTRAVENOUS

## 2012-04-02 MED ORDER — OXYCODONE HCL 5 MG PO TABS
5.0000 mg | ORAL_TABLET | ORAL | Status: DC | PRN
  Administered 2012-04-02 – 2012-04-05 (×15): 10 mg via ORAL
  Filled 2012-04-02 (×15): qty 2

## 2012-04-02 MED ORDER — BUPIVACAINE HCL (PF) 0.5 % IJ SOLN
INTRAMUSCULAR | Status: DC | PRN
  Administered 2012-04-02: 3 mL

## 2012-04-02 MED ORDER — MIDAZOLAM HCL 5 MG/5ML IJ SOLN
INTRAMUSCULAR | Status: DC | PRN
  Administered 2012-04-02: 1 mg via INTRAVENOUS

## 2012-04-02 MED ORDER — DIPHENHYDRAMINE HCL 12.5 MG/5ML PO ELIX: 12.5000 mg | ORAL_SOLUTION | ORAL | Status: DC | PRN

## 2012-04-02 MED ORDER — ALUM & MAG HYDROXIDE-SIMETH 200-200-20 MG/5ML PO SUSP: 30.0000 mL | ORAL | Status: DC | PRN

## 2012-04-02 MED ORDER — SODIUM CHLORIDE 0.9 % IV SOLN
INTRAVENOUS | Status: DC
  Administered 2012-04-02: 18:00:00 via INTRAVENOUS
  Administered 2012-04-03: 1000 mL via INTRAVENOUS

## 2012-04-02 MED ORDER — ZOLPIDEM TARTRATE 5 MG PO TABS: 5.0000 mg | ORAL_TABLET | Freq: Every evening | ORAL | Status: DC | PRN

## 2012-04-02 MED ORDER — 0.9 % SODIUM CHLORIDE (POUR BTL) OPTIME
TOPICAL | Status: DC | PRN
  Administered 2012-04-02: 1000 mL

## 2012-04-02 MED ORDER — METOCLOPRAMIDE HCL 10 MG PO TABS: 5.0000 mg | ORAL_TABLET | Freq: Three times a day (TID) | ORAL | Status: DC | PRN

## 2012-04-02 MED ORDER — LISINOPRIL 40 MG PO TABS
40.0000 mg | ORAL_TABLET | Freq: Every day | ORAL | Status: DC
  Administered 2012-04-05: 40 mg via ORAL
  Filled 2012-04-02 (×3): qty 1

## 2012-04-02 MED ORDER — METHOCARBAMOL 500 MG PO TABS
500.0000 mg | ORAL_TABLET | Freq: Four times a day (QID) | ORAL | Status: DC | PRN
  Administered 2012-04-03 – 2012-04-04 (×3): 500 mg via ORAL
  Filled 2012-04-02 (×3): qty 1

## 2012-04-02 MED ORDER — PHENOL 1.4 % MT LIQD
1.0000 | OROMUCOSAL | Status: DC | PRN
  Filled 2012-04-02: qty 177

## 2012-04-02 SURGICAL SUPPLY — 35 items
BAG SPEC THK2 15X12 ZIP CLS (MISCELLANEOUS) ×2
BAG ZIPLOCK 12X15 (MISCELLANEOUS) ×4 IMPLANT
BLADE SAW SGTL 18X1.27X75 (BLADE) ×2 IMPLANT
CLOTH BEACON ORANGE TIMEOUT ST (SAFETY) ×2 IMPLANT
DRAPE C-ARM 42X72 X-RAY (DRAPES) ×2 IMPLANT
DRAPE STERI IOBAN 125X83 (DRAPES) ×2 IMPLANT
DRAPE U-SHAPE 47X51 STRL (DRAPES) ×6 IMPLANT
DRSG MEPILEX BORDER 4X8 (GAUZE/BANDAGES/DRESSINGS) ×2 IMPLANT
DRSG XEROFORM 1X8 (GAUZE/BANDAGES/DRESSINGS) ×1 IMPLANT
DURAPREP 26ML APPLICATOR (WOUND CARE) ×2 IMPLANT
ELECT BLADE TIP CTD 4 INCH (ELECTRODE) ×2 IMPLANT
ELECT REM PT RETURN 9FT ADLT (ELECTROSURGICAL) ×2
ELECTRODE REM PT RTRN 9FT ADLT (ELECTROSURGICAL) ×1 IMPLANT
FACESHIELD LNG OPTICON STERILE (SAFETY) ×8 IMPLANT
GAUZE XEROFORM 1X8 LF (GAUZE/BANDAGES/DRESSINGS) ×2 IMPLANT
GLOVE BIO SURGEON STRL SZ7 (GLOVE) ×2 IMPLANT
GLOVE BIO SURGEON STRL SZ7.5 (GLOVE) ×2 IMPLANT
GLOVE BIOGEL PI IND STRL 7.5 (GLOVE) IMPLANT
GLOVE BIOGEL PI IND STRL 8 (GLOVE) ×1 IMPLANT
GLOVE BIOGEL PI INDICATOR 7.5 (GLOVE)
GLOVE BIOGEL PI INDICATOR 8 (GLOVE) ×1
GLOVE ECLIPSE 7.0 STRL STRAW (GLOVE) ×2 IMPLANT
GOWN STRL REIN XL XLG (GOWN DISPOSABLE) ×4 IMPLANT
HEAD CERAMIC DELTA 36 PLUS 1.5 (Hips) IMPLANT
KIT BASIN OR (CUSTOM PROCEDURE TRAY) ×2 IMPLANT
PACK TOTAL JOINT (CUSTOM PROCEDURE TRAY) ×2 IMPLANT
PADDING CAST COTTON 6X4 STRL (CAST SUPPLIES) ×2 IMPLANT
STAPLER VISISTAT 35W (STAPLE) IMPLANT
SUT ETHIBOND NAB CT1 #1 30IN (SUTURE) ×4 IMPLANT
SUT VIC AB 1 CT1 36 (SUTURE) ×4 IMPLANT
SUT VIC AB 2-0 CT1 27 (SUTURE) ×4
SUT VIC AB 2-0 CT1 TAPERPNT 27 (SUTURE) ×2 IMPLANT
TOWEL OR 17X26 10 PK STRL BLUE (TOWEL DISPOSABLE) ×4 IMPLANT
TOWEL OR NON WOVEN STRL DISP B (DISPOSABLE) ×2 IMPLANT
TRAY FOLEY CATH 14FRSI W/METER (CATHETERS) ×2 IMPLANT

## 2012-04-02 NOTE — Plan of Care (Signed)
Problem: Consults Goal: Diagnosis- Total Joint Replacement Outcome: Completed/Met Date Met:  04/02/12 Left anterior hip

## 2012-04-02 NOTE — Progress Notes (Signed)
Utilization review completed.  

## 2012-04-02 NOTE — Transfer of Care (Signed)
Immediate Anesthesia Transfer of Care Note  Patient: Corey Tate  Procedure(s) Performed: Procedure(s) (LRB) with comments: TOTAL HIP ARTHROPLASTY ANTERIOR APPROACH (Right) - Right Total Hip Arthroplasty  Patient Location: PACU  Anesthesia Type: Spinal  Level of Consciousness: awake, alert , oriented and patient cooperative  Airway & Oxygen Therapy: Patient Spontanous Breathing and Patient connected to face mask oxygen  Post-op Assessment: Report given to PACU RN and Post -op Vital signs reviewed and stable  Post vital signs: Reviewed and stable  Complications: No apparent anesthesia complications

## 2012-04-02 NOTE — Anesthesia Procedure Notes (Signed)
Spinal Patient location during procedure: OR Staffing Anesthesiologist: Sascha Baugher Performed by: anesthesiologist  Preanesthetic Checklist Completed: patient identified, site marked, surgical consent, pre-op evaluation, timeout performed, IV checked, risks and benefits discussed and monitors and equipment checked Spinal Block Patient position: sitting Prep: Betadine Patient monitoring: heart rate, continuous pulse ox and blood pressure Approach: left paramedian Location: L2-3 Injection technique: single-shot Needle Needle type: Spinocan  Needle gauge: 22 G Needle length: 9 cm Additional Notes Expiration date of kit checked and confirmed. Patient tolerated procedure well, without complications.     

## 2012-04-02 NOTE — Preoperative (Signed)
Beta Blockers   Reason not to administer Beta Blockers:Not Applicable 

## 2012-04-02 NOTE — Anesthesia Preprocedure Evaluation (Addendum)
Anesthesia Evaluation  Patient identified by MRN, date of birth, ID band Patient awake    Reviewed: Allergy & Precautions, H&P , NPO status , Patient's Chart, lab work & pertinent test results  Airway Mallampati: II TM Distance: >3 FB Neck ROM: Full    Dental No notable dental hx. (+) Edentulous Upper and Edentulous Lower   Pulmonary neg pulmonary ROS, shortness of breath and Long-Term Oxygen Therapy,  breath sounds clear to auscultation  Pulmonary exam normal       Cardiovascular hypertension, Pt. on medications + Peripheral Vascular Disease negative cardio ROS  - dysrhythmias Rhythm:Regular Rate:Normal     Neuro/Psych negative neurological ROS  negative psych ROS   GI/Hepatic negative GI ROS, Neg liver ROS,   Endo/Other  negative endocrine ROS  Renal/GU negative Renal ROS  negative genitourinary   Musculoskeletal negative musculoskeletal ROS (+)   Abdominal   Peds negative pediatric ROS (+)  Hematology negative hematology ROS (+)   Anesthesia Other Findings   Reproductive/Obstetrics negative OB ROS                          Anesthesia Physical Anesthesia Plan  ASA: III  Anesthesia Plan: Spinal   Post-op Pain Management:    Induction:   Airway Management Planned: Simple Face Mask  Additional Equipment:   Intra-op Plan:   Post-operative Plan:   Informed Consent: I have reviewed the patients History and Physical, chart, labs and discussed the procedure including the risks, benefits and alternatives for the proposed anesthesia with the patient or authorized representative who has indicated his/her understanding and acceptance.   Dental advisory given  Plan Discussed with: CRNA  Anesthesia Plan Comments:         Anesthesia Quick Evaluation

## 2012-04-02 NOTE — Anesthesia Postprocedure Evaluation (Signed)
  Anesthesia Post-op Note  Patient: Corey Tate  Procedure(s) Performed: Procedure(s) (LRB): TOTAL HIP ARTHROPLASTY ANTERIOR APPROACH (Right)  Patient Location: PACU  Anesthesia Type: Spinal  Level of Consciousness: awake and alert   Airway and Oxygen Therapy: Patient Spontanous Breathing  Post-op Pain: mild  Post-op Assessment: Post-op Vital signs reviewed, Patient's Cardiovascular Status Stable, Respiratory Function Stable, Patent Airway and No signs of Nausea or vomiting  Post-op Vital Signs: stable  Complications: No apparent anesthesia complications

## 2012-04-02 NOTE — Brief Op Note (Signed)
04/02/2012  2:27 PM  PATIENT:  Corey Tate  71 y.o. male  PRE-OPERATIVE DIAGNOSIS:  Severe osteoarthritis right hip  POST-OPERATIVE DIAGNOSIS:  Severe osteoarthritis right hip  PROCEDURE:  Procedure(s) (LRB) with comments: TOTAL HIP ARTHROPLASTY ANTERIOR APPROACH (Right) - Right Total Hip Arthroplasty  SURGEON:  Surgeon(s) and Role:    * Kathryne Hitch, MD - Primary  PHYSICIAN ASSISTANT:   ASSISTANTS: [name], PA-C   ANESTHESIA:   spinal  EBL:  Total I/O In: 2500 [I.V.:2000; IV Piggyback:500] Out: 350 [Urine:200; Blood:150]  BLOOD ADMINISTERED:none  DRAINS: none   LOCAL MEDICATIONS USED:  NONE  SPECIMEN:  No Specimen  DISPOSITION OF SPECIMEN:  N/A  COUNTS:  YES  TOURNIQUET:  * No tourniquets in log *  DICTATION: .Other Dictation: Dictation Number [id number]  PLAN OF CARE: Admit to inpatient   PATIENT DISPOSITION:  PACU - hemodynamically stable.   Delay start of Pharmacological VTE agent (>24hrs) due to surgical blood loss or risk of bleeding: no

## 2012-04-02 NOTE — H&P (Signed)
TOTAL HIP ADMISSION H&P  Patient is admitted for right total hip arthroplasty.  Subjective:  Chief Complaint: right hip pain  HPI: Corey Tate, 71 y.o. male, has a history of pain and functional disability in the right hip(s) due to arthritis and patient has failed non-surgical conservative treatments for greater than 12 weeks to include NSAID's and/or analgesics, corticosteriod injections, use of assistive devices and activity modification.  Onset of symptoms was gradual starting 5 years ago with gradually worsening course since that time.The patient noted no past surgery on the right hip(s).  Patient currently rates pain in the right hip at 8 out of 10 with activity. Patient has night pain, worsening of pain with activity and weight bearing, pain that interfers with activities of daily living and pain with passive range of motion. Patient has evidence of subchondral cysts, subchondral sclerosis and joint space narrowing by imaging studies. This condition presents safety issues increasing the risk of falls.  There is no current active infection.  Patient Active Problem List   Diagnosis Date Noted  . Degenerative arthritis of hip 04/02/2012   Past Medical History  Diagnosis Date  . Peripheral vascular disease   . Arthritis   . Hypertension   . Dysrhythmia     "SKIPS"  . Shortness of breath     USES OXYGEN AT NIGHT--HX OF RIGHT LOWER LOBE PULMONARY NODULE--FOLLOWED BY PT'S MEDICAL DOCTOR AND HAS HAD FOR YEARS  . GERD (gastroesophageal reflux disease)   . Cancer     SKIN CANCERS    Past Surgical History  Procedure Date  . Vascular surgery     STENT PLACEMENT RIGHT LEG AND "ROTOR ROOTER" LEFT LEG  . Cervical disc surgery     FUSION - ONLY SLIGHT LIMITATION IN NECK MOVEMENT  . Carpal tunnel release and surgery left elbow   . Right shoulder surgery   . Back surgery     LOWER BACK SURGERY X 3 - FUSION    Prescriptions prior to admission  Medication Sig Dispense Refill  .  amLODipine (NORVASC) 5 MG tablet Take 5 mg by mouth daily before breakfast.      . HYDROcodone-acetaminophen (NORCO/VICODIN) 5-325 MG per tablet Take 1 tablet by mouth every 6 (six) hours as needed.      [name] ibuprofen (ADVIL,MOTRIN) 200 MG tablet Take 400 mg by mouth every 6 (six) hours as needed. Pain      . lisinopril (PRINIVIL,ZESTRIL) 40 MG tablet Take 40 mg by mouth daily before breakfast.      . OXYCODONE HCL PO Take by mouth. RARELY TAKES--ONLY FOR SEVERE PAIN       Allergies  Allergen Reactions  . Streptomycin     HIVES AND ITCHING    History  Substance Use Topics  . 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 Smokeless tobacco: Never Used  . Alcohol Use: Yes     6 BEERS A DAY    History reviewed. No pertinent family history.   Review of Systems  Musculoskeletal: Positive for joint pain.  All other systems reviewed and are negative.    Objective:  Physical Exam  Constitutional: He is oriented to person, place, and time. He appears well-developed and well-nourished.  HENT:  Head: Normocephalic and atraumatic.  Eyes: EOM are normal. Pupils are equal, round, and reactive to light.  Neck: Normal range of motion. Neck supple.  Cardiovascular: Normal rate and regular rhythm.   Respiratory:  Effort normal and breath sounds normal.  GI: Soft. Bowel sounds are normal.  Musculoskeletal:       Right hip: He exhibits decreased range of motion, decreased strength, bony tenderness and crepitus.  Neurological: He is alert and oriented to person, place, and time.  Skin: Skin is warm and dry.    Vital signs in last 24 hours: Temp:  [98 F (36.7 C)] 98 F (36.7 C) (10/11 1118) Pulse Rate:  [83] 83  (10/11 1118) Resp:  [16] 16  (10/11 1118) BP: (163)/(77) 163/77 mmHg (10/11 1118) SpO2:  [100 %] 100 % (10/11 1118)  Labs:   There is no height or weight on file to calculate BMI.   Imaging Review Plain radiographs demonstrate  severe degenerative joint disease of the right hip(s). The bone quality appears to be good for age and reported activity level.  Assessment/Plan:  End stage arthritis, right hip(s)  The patient history, physical examination, clinical judgement of the provider and imaging studies are consistent with end stage degenerative joint disease of the right hip(s) and total hip arthroplasty is deemed medically necessary. The treatment options including medical management, injection therapy, arthroscopy and arthroplasty were discussed at length. The risks and benefits of total hip arthroplasty were presented and reviewed. The risks due to aseptic loosening, infection, stiffness, dislocation/subluxation,  thromboembolic complications and other imponderables were discussed.  The patient acknowledged the explanation, agreed to proceed with the plan and consent was signed. Patient is being admitted for inpatient treatment for surgery, pain control, PT, OT, prophylactic antibiotics, VTE prophylaxis, progressive ambulation and ADL's and discharge planning.The patient is planning to be discharged home with home health services

## 2012-04-03 LAB — CBC
HCT: 27.1 % — ABNORMAL LOW (ref 39.0–52.0)
Hemoglobin: 9.5 g/dL — ABNORMAL LOW (ref 13.0–17.0)
MCH: 33.2 pg (ref 26.0–34.0)
MCHC: 35.1 g/dL (ref 30.0–36.0)
MCV: 94.8 fL (ref 78.0–100.0)
Platelets: 163 10*3/uL (ref 150–400)
RBC: 2.86 MIL/uL — ABNORMAL LOW (ref 4.22–5.81)
RDW: 12 % (ref 11.5–15.5)
WBC: 8.3 10*3/uL (ref 4.0–10.5)

## 2012-04-03 LAB — BASIC METABOLIC PANEL
BUN: 14 mg/dL (ref 6–23)
CO2: 30 mEq/L (ref 19–32)
Calcium: 8.4 mg/dL (ref 8.4–10.5)
Chloride: 101 mEq/L (ref 96–112)
Creatinine, Ser: 1.08 mg/dL (ref 0.50–1.35)
GFR calc Af Amer: 79 mL/min — ABNORMAL LOW (ref >90)
GFR calc non Af Amer: 68 mL/min — ABNORMAL LOW (ref >90)
Glucose, Bld: 113 mg/dL — ABNORMAL HIGH (ref 70–99)
Potassium: 4.4 mEq/L (ref 3.5–5.1)
Sodium: 134 mEq/L — ABNORMAL LOW (ref 135–145)

## 2012-04-03 MED ORDER — ASPIRIN EC 325 MG PO TBEC
325.0000 mg | DELAYED_RELEASE_TABLET | Freq: Two times a day (BID) | ORAL | Status: DC
  Administered 2012-04-04 – 2012-04-05 (×3): 325 mg via ORAL
  Filled 2012-04-03 (×4): qty 1

## 2012-04-03 NOTE — Progress Notes (Signed)
   CARE MANAGEMENT NOTE 04/03/2012  Patient:  MING, MCMANNIS   Account Number:  [id number]  Date Initiated:  04/03/2012  Documentation initiated by:  Ramon Brant  Subjective/Objective Assessment:   Pt with HH needs     Action/Plan:   Pt preassigned to [place], per Genevieve Norlander pt has DME arranged from family. HH will be provided by Genevieve Norlander   Anticipated DC Date:  04/05/2012   Anticipated DC Plan:  HOME W HOME HEALTH SERVICES         [facility] Choice  HOME HEALTH   Choice offered to / List presented to:  C-1 Patient        HH arranged  HH-1 RN  HH-2 PT      [facility] agency  [facility]   Status of service:  Completed, signed off Medicare Important Message given?   (If response is "NO", the following Medicare IM given date fields will be blank) Date Medicare IM given:   Date Additional Medicare IM given:    Discharge Disposition:  HOME W HOME HEALTH SERVICES  Per UR Regulation:    If discussed at Long Length of Stay Meetings, dates discussed:    Comments:  04/03/2012 Per pt , his wife is borrowing DME from family members/friends. Johny Shock RN MPH

## 2012-04-03 NOTE — Op Note (Signed)
NAMESHADEN, LACHER NO.:  [id number]  MEDICAL RECORD NO.:  [id number]  LOCATION:  1603                         FACILITY:  [facility]  PHYSICIAN:  Vanita Panda. Magnus Ivan, M.D.DATE OF BIRTH:  Jun 16, 1941  DATE OF PROCEDURE:  04/02/2012 DATE OF DISCHARGE:                              OPERATIVE REPORT   PREOPERATIVE DIAGNOSES:  End-stage arthritis and degenerative joint disease, right hip.  POSTOPERATIVE DIAGNOSIS:  End-stage arthritis and degenerative joint disease, right hip.  PROCEDURE:  Right total hip arthroplasty through direct anterior approach.  IMPLANTS:  DePuy Sector Gription acetabular component, size 56, size 36+ 4 neutral polyethylene liner, size 10 Corail component with standard offset, size 36- 2 metal hip ball.  SURGEON:  Vanita Panda. Magnus Ivan, MD  ASSISTANT:  Wende Neighbors, PA-C  ANESTHESIA:  Spinal.  BLOOD LOSS:  Between 500-700 mL.  COMPLICATIONS:  None.  INDICATIONS:  Corey Tate is a 71 year old gentleman with end-stage arthritis of his right hip.  He has x-ray evidence of severe joint space narrowing, marginal osteophytes, and subchondral sclerosis.  His pain is daily.  His activities of daily living is significantly limited.  He wished to proceed at this point with a total hip arthroplasty.  The risks and benefits of the surgery have been explained to him in detail and he does wish to proceed.  PROCEDURE DESCRIPTION:  After informed consent was obtained on appropriate right hip and when the appropriate right hip was marked, he was brought to the operating room and spinal anesthesia was obtained while he was on the stretcher.  He was then put in a supine position. Traction boots were placed on his feet and a Foley catheter was placed. He was then placed supine on the Hana fracture table with the perineal post in place and both legs in inline skeletal traction with no traction applied.  His right operative hip was then prepped and  draped with DuraPrep and sterile drapes.  A time-out was called to identify the correct patient and correct right hip.  I then made an incision just inferior and posterior to the anterior superior iliac spine and carried this obliquely down to the leg.  I dissected down to the tensor fascia lata and the tensor fascia was divided obliquely, so I could proceed with a direct anterior approach to the hip.  A Cobra retractor was placed around the lateral neck and then up underneath the rectus femoris.  Cobra retractor was placed medially.  I cauterized the lateral femoral circumflex vessels and then made my capsular incision and placed the Cobra retractors within the capsule.  I then made my femoral neck cut just proximal to the lesser trochanter with an oscillating saw and finished this with an osteotome.  I placed a corkscrew guide in the femoral head and removed the femoral head in its entirety.  I then cleaned the acetabulum debris.  I placed the Bent Hohmann medially and a Cobra retractor laterally.  I began reaming from size 42 and 2-mm increments all the way up to size 56 with all reamers were placed under direct visualization.  The last two reamers were placed under direct fluoroscopy.  I did obtain  my depth of reaming, my inclination and anteversion.  When I was happy with this, I chose a size 56 DePuy Sector Gription acetabular component and we knocked this into place with a mallet.  I was pleased with the alignment and so we placed the real 36+ 4 neutral polyethylene liner.  Attention was then turned to the femur with all traction off the leg.  The leg was externally rotated to 90 degrees, extended and adducted to allow access to the femoral canal.  A Mueller retractor was placed medially and long Bent Hohmann was placed over the greater trochanter.  I released the lateral capsule, the piriformis and then lateralized with a rongeur.  I used a box cutting guide to open the femoral  canal and then used size 8, 9 and 10 broaches with the last broach being very tight.  I then trialed a standard neck and a 36+ 1.5 hip ball.  We brought the hip back up and over and with traction and internal rotation, we reduced the hip.  I was pleased with the alignment overall and felt that we were just a little bit long.  I then dislocated the hip again and we placed the real femoral component size 10, and as I was getting this down, I was just a little bit proud. Then, I wanted with unfortunately already opened up the ceramic +1.5 hip ball.  So, I then chose a metal -2 hip ball and we placed this on to the stem after trialing with all real components in place, we brought the leg back up and over with traction, internal rotation and reduced this into the acetabulum and that was stable.  Measuring his leg lengths radiographically, he was near equal.  We then copiously irrigated the tissues with normal saline solution.  I closed the joint capsule with interrupted #1 Ethibond suture followed by running 0 V-Loc suture in the tensor fascia lata, 2-0 Vicryl in the subcutaneous tissue, and interrupted staples on the skin.  Xeroform followed by well-padded sterile dressing was applied.  The patient was taken off the Hana table and went to the recovery room in stable condition.  All final counts were correct.  There were no complications noted.  Of note, [name], PA-C was present and participated in the entire case and her assistance was integral in getting the case completed.     Vanita Panda. Magnus Ivan, M.D.     CYB/MEDQ  D:  04/02/2012  T:  04/03/2012  Job:  [id number]

## 2012-04-03 NOTE — Progress Notes (Signed)
Subjective: 1 Day Post-Op Procedure(s) (LRB): TOTAL HIP ARTHROPLASTY ANTERIOR APPROACH (Right) Patient reports pain as 9 on 0-10 scale.    Objective: Vital signs in last 24 hours: Temp:  [97.9 F (36.6 C)-99.5 F (37.5 C)] 99.5 F (37.5 C) (10/12 1320) Pulse Rate:  [58-78] 78  (10/12 1320) Resp:  [16-17] 16  (10/12 1320) BP: (93-153)/(54-81) 99/66 mmHg (10/12 1320) SpO2:  [94 %-97 %] 96 % (10/12 1320)  Intake/Output from previous day: 10/11 0701 - 10/12 0700 In: 4273.8 [P.O.:240; I.V.:3533.8; IV Piggyback:500] Out: 3050 [Urine:2900; Blood:150] Intake/Output this shift: Total I/O In: 480 [P.O.:480] Out: 300 [Urine:300]   Basename 04/03/12 0416  HGB 9.5*    Basename 04/03/12 0416  WBC 8.3  RBC 2.86*  HCT 27.1*  PLT 163    Basename 04/03/12 0416  NA 134*  K 4.4  CL 101  CO2 30  BUN 14  CREATININE 1.08  GLUCOSE 113*  CALCIUM 8.4   No results found for this basename: LABPT:2,INR:2 in the last 72 hours  Neurologically intact  Assessment/Plan: 1 Day Post-Op Procedure(s) (LRB): TOTAL HIP ARTHROPLASTY ANTERIOR APPROACH (Right) Up with therapy  Talon Witting C 04/03/2012, 4:42 PM

## 2012-04-03 NOTE — Evaluation (Signed)
Physical Therapy Evaluation Patient Details Name: Corey Tate MRN: [id number] DOB: 1940/09/15 Today's Date: 04/03/2012 Time: 4098-1191 PT Time Calculation (min): 24 min  PT Assessment / Plan / Recommendation Clinical Impression  Pt presents s/p R THA (direct ant) POD 1 with decreased strength, ROM and mobility.  Tolerated OOB and ambulation in hallway, however pt with increased soreness and noted SOB with ambulation.  Cues for pursed lip breathing and pt also used rescue inhaler once in room.  Pt will benefit from skilled PT in acute venue to address deficits. PT recommends HHPT for follow up at D/C to maximize pts independence.     PT Assessment  Patient needs continued PT services    Follow Up Recommendations  Home health PT    Does the patient have the potential to tolerate intense rehabilitation      Barriers to Discharge None      Equipment Recommendations  Rolling walker with 5" wheels    Recommendations for Other Services OT consult   Frequency 7X/week    Precautions / Restrictions Precautions Precautions: Fall Restrictions Weight Bearing Restrictions: No   Pertinent Vitals/Pain 8/10, RN notified, refused ice pack      Mobility  Bed Mobility Bed Mobility: Supine to Sit;Sitting - Scoot to Edge of Bed Supine to Sit: 4: Min assist;HOB elevated Sitting - Scoot to [place] of Bed: 5: Supervision Details for Bed Mobility Assistance: Assist for RLE out of bed with cues for UE placement/technique for getting to EOB.  Transfers Transfers: Sit to Stand;Stand to Sit Sit to Stand: 4: Min assist;From elevated surface;With upper extremity assist;From bed Stand to Sit: 4: Min assist;With upper extremity assist;With armrests;To chair/3-in-1 Details for Transfer Assistance: Assist to rise and steady with cues for hand placement and LE management when sitting/standing.   Ambulation/Gait Ambulation/Gait Assistance: 4: Min assist Ambulation Distance (Feet): 80 Feet Assistive  device: Rolling walker Ambulation/Gait Assistance Details: Cues for sequencing/technique with RW as pt demos tendency to step too far inside of RW, upright posture, and standing breaks as needed due to noted SOB.   Gait Pattern: Step-to pattern;Decreased stance time - right;Decreased step length - left;Trunk flexed Gait velocity: decreased Stairs: No Wheelchair Mobility Wheelchair Mobility: No    Shoulder Instructions     Exercises     PT Diagnosis: Difficulty walking;Generalized weakness;Acute pain  PT Problem List: Decreased strength;Decreased range of motion;Decreased activity tolerance;Decreased balance;Decreased mobility;Cardiopulmonary status limiting activity;Decreased knowledge of use of DME;Pain;Decreased knowledge of precautions PT Treatment Interventions: DME instruction;Gait training;Stair training;Functional mobility training;Therapeutic activities;Therapeutic exercise;Balance training;Patient/family education   PT Goals Acute Rehab PT Goals PT Goal Formulation: With patient Time For Goal Achievement: 04/06/12 Potential to Achieve Goals: Good Pt will go Supine/Side to Sit: with supervision PT Goal: Supine/Side to Sit - Progress: Goal set today Pt will go Sit to Supine/Side: with supervision PT Goal: Sit to Supine/Side - Progress: Goal set today Pt will go Sit to Stand: with supervision PT Goal: Sit to Stand - Progress: Goal set today Pt will Ambulate: 51 - 150 feet;with supervision;with least restrictive assistive device PT Goal: Ambulate - Progress: Goal set today Pt will Go Up / Down Stairs: 1-2 stairs;with supervision;with least restrictive assistive device PT Goal: Up/Down Stairs - Progress: Goal set today Pt will Perform Home Exercise Program: with supervision, verbal cues required/provided PT Goal: Perform Home Exercise Program - Progress: Goal set today  Visit Information  Last PT Received On: 04/03/12 Assistance Needed: +1    Subjective Data  Subjective:  I'm pretty  sore Patient Stated Goal: to get better   Prior Functioning  Home Living Lives With: Spouse Available Help at Discharge: Family Type of Home: House Home Access: Stairs to enter Entrance Stairs-Rails: None Home Layout: One level Bathroom Shower/Tub: Door;Walk-in Stage manager: Standard Home Adaptive Equipment: Grab bars in shower;Straight cane;Crutches;Bedside commode/3-in-1 Additional Comments: Bult in shower seat Prior Function Level of Independence: Independent Able to Take Stairs?: Yes Driving: Yes Vocation: Retired Musician: HOH;Expressive difficulties (somewhat hard to understand.)    Cognition  Overall Cognitive Status: Appears within functional limits for tasks assessed/performed Arousal/Alertness: Awake/alert Orientation Level: Appears intact for tasks assessed Behavior During Session: [facility] for tasks performed    Extremity/Trunk Assessment Right Lower Extremity Assessment RLE ROM/Strength/Tone: Deficits;Unable to fully assess;Due to pain RLE ROM/Strength/Tone Deficits: ankle motions WFL, unable to fully assess other motions due to increased pain RLE Sensation: WFL - Light Touch Left Lower Extremity Assessment LLE ROM/Strength/Tone: WFL for tasks assessed LLE Sensation: WFL - Light Touch Trunk Assessment Trunk Assessment: Normal   Balance    End of Session PT - End of Session Equipment Utilized During Treatment: Gait belt Activity Tolerance: Patient limited by fatigue;Patient limited by pain Patient left: in chair;with call bell/phone within reach Nurse Communication: Mobility status;Patient requests pain meds  GP     Page, Meribeth Mattes 04/03/2012, 8:41 AM

## 2012-04-03 NOTE — Progress Notes (Signed)
Physical Therapy Treatment Patient Details Name: Corey Tate MRN: [id number] DOB: 08-14-1940 Today's Date: 04/03/2012 Time: 6578-4696 PT Time Calculation (min): 30 min  PT Assessment / Plan / Recommendation Comments on Treatment Session  Pt continues to have increased pain and sorenss in hip.  Pt was premedicated approx 30 mins prior to session.  Recommended trying ice pack to control pain.     Follow Up Recommendations  Home health PT     Does the patient have the potential to tolerate intense rehabilitation     Barriers to Discharge        Equipment Recommendations  Rolling walker with 5" wheels    Recommendations for Other Services OT consult  Frequency 7X/week   Plan Discharge plan remains appropriate    Precautions / Restrictions Precautions Precautions: Fall Restrictions Weight Bearing Restrictions: No Other Position/Activity Restrictions: WBAT   Pertinent Vitals/Pain 7/10, premedicated, ice applied.     Mobility  Bed Mobility Bed Mobility: Supine to Sit;Sit to Supine Supine to Sit: 4: Min assist;HOB elevated Sit to Supine: 4: Min assist;HOB flat Details for Bed Mobility Assistance: Assist for RLE into and out of bed with min cues for hand placement and to adjust hips once in bed.  Transfers Transfers: Sit to Stand;Stand to Sit Sit to Stand: 4: Min assist;From elevated surface;With upper extremity assist;From bed Stand to Sit: 4: Min assist;With upper extremity assist;To bed Details for Transfer Assistance: Assist to rise and steady with cues for hand placement and LE management when sitting/standing.   Ambulation/Gait Ambulation/Gait Assistance: 4: Min assist Ambulation Distance (Feet): 45 Feet Assistive device: Rolling walker Ambulation/Gait Assistance Details: Cues for sequencing/technique with RW (not to step too far inside of RW) and to maintain upright posture.  Gait Pattern: Step-to pattern;Decreased stance time - right;Decreased step length -  left;Trunk flexed Gait velocity: decreased Stairs: No Wheelchair Mobility Wheelchair Mobility: No    Exercises Total Joint Exercises Ankle Circles/Pumps: AROM;Both;20 reps Quad Sets: AROM;Right;10 reps Short Arc Quad: AROM;Right;10 reps Heel Slides: AAROM;Right;10 reps Hip ABduction/ADduction: AAROM;Right;10 reps   PT Diagnosis:    PT Problem List:   PT Treatment Interventions:     PT Goals Acute Rehab PT Goals PT Goal Formulation: With patient Time For Goal Achievement: 04/06/12 Potential to Achieve Goals: Good Pt will go Supine/Side to Sit: with supervision PT Goal: Supine/Side to Sit - Progress: Progressing toward goal Pt will go Sit to Supine/Side: with supervision PT Goal: Sit to Supine/Side - Progress: Progressing toward goal Pt will go Sit to Stand: with supervision PT Goal: Sit to Stand - Progress: Progressing toward goal Pt will Ambulate: 51 - 150 feet;with supervision;with least restrictive assistive device PT Goal: Ambulate - Progress: Progressing toward goal Pt will Perform Home Exercise Program: with supervision, verbal cues required/provided PT Goal: Perform Home Exercise Program - Progress: Progressing toward goal  Visit Information  Last PT Received On: 04/03/12 Assistance Needed: +1    Subjective Data  Subjective: Its just so sore Patient Stated Goal: to get better   Cognition  Overall Cognitive Status: Appears within functional limits for tasks assessed/performed Arousal/Alertness: Awake/alert Orientation Level: Appears intact for tasks assessed Behavior During Session: [facility] for tasks performed    Balance     End of Session PT - End of Session Activity Tolerance: Patient limited by fatigue;Patient limited by pain Patient left: in bed;with call bell/phone within reach Nurse Communication: Mobility status   GP     Page, Meribeth Mattes 04/03/2012, 2:23 PM

## 2012-04-04 LAB — CBC
HCT: 26.2 % — ABNORMAL LOW (ref 39.0–52.0)
Hemoglobin: 9.4 g/dL — ABNORMAL LOW (ref 13.0–17.0)
MCH: 33.9 pg (ref 26.0–34.0)
MCHC: 35.9 g/dL (ref 30.0–36.0)
MCV: 94.6 fL (ref 78.0–100.0)
Platelets: 136 10*3/uL — ABNORMAL LOW (ref 150–400)
RBC: 2.77 MIL/uL — ABNORMAL LOW (ref 4.22–5.81)
RDW: 12 % (ref 11.5–15.5)
WBC: 9.8 10*3/uL (ref 4.0–10.5)

## 2012-04-04 NOTE — Progress Notes (Signed)
04/04/2012 1500 Request left for d/c MD to write order for [facility] and complete F2F for this Medicare pt. Isidoro Donning RN CCM Case Mgmt phone [phone]

## 2012-04-04 NOTE — Progress Notes (Signed)
Physical Therapy Treatment Patient Details Name: Corey Tate MRN: [id number] DOB: September 08, 1940 Today's Date: 04/04/2012 Time: 0102-7253 PT Time Calculation (min): 18 min  PT Assessment / Plan / Recommendation Comments on Treatment Session  Pt with improved mobility today, however continues to have increased soreness in hip.      Follow Up Recommendations  Home health PT     Does the patient have the potential to tolerate intense rehabilitation     Barriers to Discharge        Equipment Recommendations  Rolling walker with 5" wheels    Recommendations for Other Services    Frequency 7X/week   Plan Discharge plan remains appropriate    Precautions / Restrictions Precautions Precautions: Fall Restrictions Weight Bearing Restrictions: No Other Position/Activity Restrictions: WBAT   Pertinent Vitals/Pain 5/10 pain, premedicated    Mobility  Bed Mobility Bed Mobility: Supine to Sit Supine to Sit: HOB elevated;4: Min guard Details for Bed Mobility Assistance: Guarding assist for RLE out of bed.  Min cues for UE placement/technique.   Transfers Transfers: Sit to Stand;Stand to Sit Sit to Stand: 4: Min guard;From elevated surface;With upper extremity assist;From bed Stand to Sit: 4: Min guard;With upper extremity assist;With armrests;To chair/3-in-1 Details for Transfer Assistance: Min/guard for safety with cues for hand placement and LE management when sitting/standing.  Ambulation/Gait Ambulation/Gait Assistance: 4: Min guard Ambulation Distance (Feet): 65 Feet Assistive device: Rolling walker Ambulation/Gait Assistance Details: Cues for equal step length, upright posture, and technique when turning.   Gait Pattern: Step-to pattern;Decreased stance time - right;Decreased step length - left;Trunk flexed Gait velocity: decreased    Exercises     PT Diagnosis:    PT Problem List:   PT Treatment Interventions:     PT Goals Acute Rehab PT Goals PT Goal Formulation:  With patient Time For Goal Achievement: 04/06/12 Potential to Achieve Goals: Good Pt will go Supine/Side to Sit: with supervision PT Goal: Supine/Side to Sit - Progress: Progressing toward goal Pt will go Sit to Stand: with supervision PT Goal: Sit to Stand - Progress: Progressing toward goal Pt will Ambulate: 51 - 150 feet;with supervision;with least restrictive assistive device PT Goal: Ambulate - Progress: Progressing toward goal  Visit Information  Last PT Received On: 04/04/12 Assistance Needed: +1    Subjective Data  Subjective: Its better than yesterday, but still sore.  Patient Stated Goal: to get better   Cognition  Overall Cognitive Status: Appears within functional limits for tasks assessed/performed Arousal/Alertness: Awake/alert Orientation Level: Appears intact for tasks assessed Behavior During Session: [facility] for tasks performed    Balance     End of Session PT - End of Session Activity Tolerance: Patient limited by pain Patient left: in chair;with call bell/phone within reach Nurse Communication: Mobility status   GP     Page, Meribeth Mattes 04/04/2012, 9:20 AM

## 2012-04-04 NOTE — Evaluation (Signed)
Occupational Therapy Evaluation Patient Details Name: Corey Tate MRN: [id number] DOB: Mar 19, 1941 Today's Date: 04/04/2012 Time: 4098-1191 OT Time Calculation (min): 25 min  OT Assessment / Plan / Recommendation Clinical Impression  This 71 yo male s/p RTHA (direct anterior approach) presents to acute OT with problems below, will benefit from acute OT without need for follow up.    OT Assessment  Patient needs continued OT Services    Follow Up Recommendations  No OT follow up    Barriers to Discharge None    Equipment Recommendations  None recommended by PT;None recommended by OT       Frequency  Min 2X/week    Precautions / Restrictions Precautions Precautions: Fall Restrictions Weight Bearing Restrictions: No Other Position/Activity Restrictions: WBAT   Pertinent Vitals/Pain R hip sore    ADL  Eating/Feeding: Simulated;Independent Where Assessed - Eating/Feeding: Chair Grooming: Simulated;Set up Where Assessed - Grooming: Unsupported sitting Upper Body Bathing: Simulated;Set up Where Assessed - Upper Body Bathing: Unsupported sitting Lower Body Bathing: Simulated;Minimal assistance Where Assessed - Lower Body Bathing: Unsupported sit to stand Upper Body Dressing: Simulated;Set up Where Assessed - Upper Body Dressing: Unsupported sitting Lower Body Dressing: Performed;Moderate assistance Where Assessed - Lower Body Dressing: Unsupported sit to stand Toilet Transfer: Simulated;Min guard Toilet Transfer Method: Sit to Barista:  (Sit to stand from recliner, 2 steps foreward and back) Toileting - Architect and Hygiene: Simulated;Min guard Where Assessed - Toileting Clothing Manipulation and Hygiene: Standing Transfers/Ambulation Related to ADLs: Min guard A ADL Comments: Spoke with pt and asked him to ask his [facility] therapist to go over walk in tub transfer with him, since there is not any way we can simulate that here and he agrees,  did make it a point to tell him to step into the tub with good leg and out with operated leg. Wife will help with LB ADLs until he can do them for himself.    OT Diagnosis: Acute pain;Generalized weakness  OT Problem List: Decreased strength;Impaired balance (sitting and/or standing);Pain OT Treatment Interventions: Self-care/ADL training;DME and/or AE instruction;Patient/family education;Balance training   OT Goals Acute Rehab OT Goals OT Goal Formulation: With patient Time For Goal Achievement: 04/11/12 Potential to Achieve Goals: Good ADL Goals Pt Will Perform Grooming: with supervision;Unsupported;Standing at sink (2 tasks') ADL Goal: Grooming - Progress: Goal set today Pt Will Transfer to Toilet: with supervision;Ambulation;with DME;3-in-1 ADL Goal: Toilet Transfer - Progress: Goal set today Pt Will Perform Toileting - Clothing Manipulation: Independently;Standing ADL Goal: Toileting - Clothing Manipulation - Progress: Goal set today Pt Will Perform Toileting - Hygiene: Independently;Sit to stand from 3-in-1/toilet ADL Goal: Toileting - Hygiene - Progress: Goal set today Miscellaneous OT Goals Miscellaneous OT Goal #1: Pt will be S in and OOB for BADLs OT Goal: Miscellaneous Goal #1 - Progress: Goal set today  Visit Information  Assistance Needed: +1    Subjective Data  Subjective: My hip is sore   Prior Functioning     Home Living Lives With: Spouse Available Help at Discharge: Family Type of Home: House Home Access: Stairs to enter Entrance Stairs-Rails: None Home Layout: One level Bathroom Shower/Tub: Curtain (walk in tub) Bathroom Toilet: Standard Bathroom Accessibility: Yes How Accessible: Accessible via walker Home Adaptive Equipment: Grab bars in shower;Straight cane;Crutches;Bedside commode/3-in-1;Built-in shower seat (built in seat is in walk in tub) Prior Function Level of Independence: Independent Able to Take Stairs?: Yes Driving: Yes Vocation:  Retired Musician: HOH Dominant Hand: Right  Cognition  Overall Cognitive Status: Appears within functional limits for tasks assessed/performed Arousal/Alertness: Awake/alert Orientation Level: Appears intact for tasks assessed Behavior During Session: [facility] for tasks performed    Extremity/Trunk Assessment Right Upper Extremity Assessment RUE ROM/Strength/Tone: Within functional levels Left Upper Extremity Assessment LUE ROM/Strength/Tone: Within functional levels     Mobility Bed Mobility Bed Mobility: Supine to Sit Supine to Sit: HOB elevated;4: Min guard Details for Bed Mobility Assistance: Guarding assist for RLE out of bed.  Min cues for UE placement/technique.   Transfers Transfers: Sit to Stand;Stand to Sit Sit to Stand: 4: Min guard;With upper extremity assist;With armrests;From chair/3-in-1 Stand to Sit: 4: Min guard;With upper extremity assist;With armrests;To chair/3-in-1 Details for Transfer Assistance: Min/guard for safety with cues for hand placement and LE management when sitting/standing.               End of Session OT - End of Session Equipment Utilized During Treatment:  (RW) Activity Tolerance: Patient tolerated treatment well Patient left: in chair;with call bell/phone within reach       Evette Georges [phone] 04/04/2012, 10:56 AM

## 2012-04-04 NOTE — Progress Notes (Addendum)
Physical Therapy Treatment Patient Details Name: Corey Tate MRN: [id number] DOB: 08/23/1940 Today's Date: 04/04/2012 Time: 7829-5621 PT Time Calculation (min): 26 min  PT Assessment / Plan / Recommendation Comments on Treatment Session  Pts mobility continues to improve, however continues to be sore.  Pt set for D/C tomorrow.     Follow Up Recommendations  Home health PT     Does the patient have the potential to tolerate intense rehabilitation     Barriers to Discharge        Equipment Recommendations  Pt will need Rolling Walker;None recommended by OT    Recommendations for Other Services    Frequency 7X/week   Plan Discharge plan remains appropriate    Precautions / Restrictions Precautions Precautions: Fall Restrictions Weight Bearing Restrictions: No Other Position/Activity Restrictions: WBAT   Pertinent Vitals/Pain 4/10    Mobility  Bed Mobility Bed Mobility: Sit to Supine Sit to Supine: 4: Min guard;HOB flat Details for Bed Mobility Assistance: Guarding assist to ensure safety of RLE into bed with min cues for adjusting hips once in bed.  Transfers Transfers: Sit to Stand;Stand to Sit Sit to Stand: 4: Min guard;With upper extremity assist;With armrests;From chair/3-in-1 Stand to Sit: 4: Min guard;With upper extremity assist;To bed Details for Transfer Assistance: min/guard for safety with min cues for hand placement/safety when sitting/standing.  Ambulation/Gait Ambulation/Gait Assistance: 4: Min guard Ambulation Distance (Feet): 100 Feet Assistive device: Rolling walker Ambulation/Gait Assistance Details: Continue to provide cues for not stepping too far inside of RW.  Gait Pattern: Step-to pattern;Decreased stance time - right;Decreased step length - left;Trunk flexed Gait velocity: decreased    Exercises Total Joint Exercises Ankle Circles/Pumps: AROM;Both;20 reps Quad Sets: AROM;Right;10 reps Heel Slides: AAROM;Right;10 reps Hip  ABduction/ADduction: AAROM;Right;10 reps   PT Diagnosis:    PT Problem List:   PT Treatment Interventions:     PT Goals Acute Rehab PT Goals PT Goal Formulation: With patient Time For Goal Achievement: 04/06/12 Potential to Achieve Goals: Good Pt will go Sit to Supine/Side: with supervision PT Goal: Sit to Supine/Side - Progress: Progressing toward goal Pt will go Sit to Stand: with supervision PT Goal: Sit to Stand - Progress: Progressing toward goal Pt will Ambulate: 51 - 150 feet;with supervision;with least restrictive assistive device PT Goal: Ambulate - Progress: Progressing toward goal Pt will Perform Home Exercise Program: with supervision, verbal cues required/provided PT Goal: Perform Home Exercise Program - Progress: Progressing toward goal  Visit Information  Last PT Received On: 04/04/12 Assistance Needed: +1    Subjective Data  Subjective: I feel a little better this afternoon.  Patient Stated Goal: to get better   Cognition  Overall Cognitive Status: Appears within functional limits for tasks assessed/performed Arousal/Alertness: Awake/alert Orientation Level: Appears intact for tasks assessed Behavior During Session: [facility] for tasks performed    Balance     End of Session PT - End of Session Activity Tolerance: Patient tolerated treatment well Patient left: with call bell/phone within reach;in bed;with family/visitor present Nurse Communication: Mobility status   GP     Page, Meribeth Mattes 04/04/2012, 3:11 PM

## 2012-04-04 NOTE — Plan of Care (Signed)
Problem: Phase III Progression Outcomes Goal: Anticoagulant follow-up in place Outcome: Not Applicable Date Met:  04/04/12 xarelto     

## 2012-04-04 NOTE — Progress Notes (Signed)
Subjective: 2 Days Post-Op Procedure(s) (LRB): TOTAL HIP ARTHROPLASTY ANTERIOR APPROACH (Right) Patient reports pain as moderate.   Ambulating in hall with therapy.  Objective: Vital signs in last 24 hours: Temp:  [98.5 F (36.9 C)-99.5 F (37.5 C)] 98.5 F (36.9 C) (10/13 0647) Pulse Rate:  [71-85] 71  (10/13 0647) Resp:  [16-18] 18  (10/13 0647) BP: (96-125)/(56-74) 113/67 mmHg (10/13 0647) SpO2:  [94 %-96 %] 95 % (10/13 0647)  Intake/Output from previous day: 10/12 0701 - 10/13 0700 In: 720 [P.O.:720] Out: 1500 [Urine:1500] Intake/Output this shift:     Basename 04/04/12 0438 04/03/12 0416  HGB 9.4* 9.5*    Basename 04/04/12 0438 04/03/12 0416  WBC 9.8 8.3  RBC 2.77* 2.86*  HCT 26.2* 27.1*  PLT 136* 163    Basename 04/03/12 0416  NA 134*  K 4.4  CL 101  CO2 30  BUN 14  CREATININE 1.08  GLUCOSE 113*  CALCIUM 8.4   No results found for this basename: LABPT:2,INR:2 in the last 72 hours  Neurologically intact  Assessment/Plan: 2 Days Post-Op Procedure(s) (LRB): TOTAL HIP ARTHROPLASTY ANTERIOR APPROACH (Right) Up with therapy   Likely stairs tomorrow then discharge if he meets therapy goals.   Corey Tate C 04/04/2012, 8:54 AM

## 2012-04-05 ENCOUNTER — Encounter (HOSPITAL_COMMUNITY): Payer: Self-pay | Admitting: Orthopaedic Surgery

## 2012-04-05 LAB — CBC
HCT: 24.2 % — ABNORMAL LOW (ref 39.0–52.0)
Hemoglobin: 8.5 g/dL — ABNORMAL LOW (ref 13.0–17.0)
MCH: 33.3 pg (ref 26.0–34.0)
MCHC: 35.1 g/dL (ref 30.0–36.0)
MCV: 94.9 fL (ref 78.0–100.0)
Platelets: 135 10*3/uL — ABNORMAL LOW (ref 150–400)
RBC: 2.55 MIL/uL — ABNORMAL LOW (ref 4.22–5.81)
RDW: 11.9 % (ref 11.5–15.5)
WBC: 8.9 10*3/uL (ref 4.0–10.5)

## 2012-04-05 MED ORDER — FERROUS SULFATE 325 (65 FE) MG PO TABS: 325.0000 mg | ORAL_TABLET | Freq: Three times a day (TID) | ORAL | Status: DC

## 2012-04-05 MED ORDER — OXYCODONE-ACETAMINOPHEN 5-325 MG PO TABS: 1.0000 | ORAL_TABLET | ORAL | Status: AC | PRN

## 2012-04-05 MED ORDER — ASPIRIN 325 MG PO TBEC: 325.0000 mg | DELAYED_RELEASE_TABLET | Freq: Two times a day (BID) | ORAL | Status: DC

## 2012-04-05 MED ORDER — METHOCARBAMOL 500 MG PO TABS: 500.0000 mg | ORAL_TABLET | Freq: Four times a day (QID) | ORAL | Status: DC | PRN

## 2012-04-05 NOTE — Progress Notes (Signed)
Occupational Therapy Treatment Patient Details Name: Corey Tate MRN: [id number] DOB: Dec 21, 1940 Today's Date: 04/05/2012 Time: 4098-1191 OT Time Calculation (min): 16 min  OT Assessment / Plan / Recommendation Comments on Treatment Session Pt progressing well. D/c planned for today.    Follow Up Recommendations  No OT follow up    Barriers to Discharge       Equipment Recommendations  Rolling walker with 5" wheels    Recommendations for Other Services    Frequency Min 2X/week   Plan Discharge plan remains appropriate    Precautions / Restrictions Precautions Precautions: Fall Restrictions Weight Bearing Restrictions: No Other Position/Activity Restrictions: WBAT   Pertinent Vitals/Pain Reported 7/10 pain. Repositioned for comfort.    ADL  Grooming: Performed;Wash/dry hands;Min guard Where Assessed - Grooming: Unsupported standing Toilet Transfer: Performed;Min guard Statistician Method: Sit to Barista: Comfort height toilet;Grab bars Toileting - Clothing Manipulation and Hygiene: Simulated;Min guard Where Assessed - Engineer, mining and Hygiene: Sit to stand from 3-in-1 or toilet ADL Comments: Wife states she will assist pt with LB ADLs until pt is able to do them himself.    OT Diagnosis:    OT Problem List:   OT Treatment Interventions:     OT Goals ADL Goals ADL Goal: Grooming - Progress: Progressing toward goals ADL Goal: Toilet Transfer - Progress: Progressing toward goals ADL Goal: Toileting - Clothing Manipulation - Progress: Progressing toward goals ADL Goal: Toileting - Hygiene - Progress: Progressing toward goals Miscellaneous OT Goals OT Goal: Miscellaneous Goal #1 - Progress: Progressing toward goals  Visit Information  Last OT Received On: 04/05/12 Assistance Needed: +1    Subjective Data  Subjective: I have one of those cut out tubs   Prior Functioning       Cognition  Overall Cognitive Status:  Appears within functional limits for tasks assessed/performed Arousal/Alertness: Awake/alert Orientation Level: Appears intact for tasks assessed Behavior During Session: [facility] for tasks performed    Mobility  Shoulder Instructions Bed Mobility Bed Mobility: Supine to Sit Supine to Sit: 4: Min guard;HOB flat Details for Bed Mobility Assistance: Guarding assist to ensure safety of RLE into bed.   Transfers Sit to Stand: 5: Supervision;4: Min guard;With upper extremity assist;From toilet;From chair/3-in-1;With armrests Stand to Sit: 4: Min guard;5: Supervision;With upper extremity assist;With armrests;To chair/3-in-1;To toilet Details for Transfer Assistance: Min cues for hand placement/safety.        Exercises     Balance     End of Session OT - End of Session Activity Tolerance: Patient tolerated treatment well Patient left: in chair;with call bell/phone within reach;with family/visitor present Nurse Communication: Other (comment) (Pt needs a RW before he can d/c home.)  GO     Corey Tate A OTR/L [phone] 04/05/2012, 11:20 AM

## 2012-04-05 NOTE — Progress Notes (Signed)
Physical Therapy Treatment Patient Details Name: Corey Tate MRN: [id number] DOB: 05-19-1941 Today's Date: 04/05/2012 Time: 4098-1191 PT Time Calculation (min): 20 min  PT Assessment / Plan / Recommendation Comments on Treatment Session  Pt progressing well with ambulation, exercises and is safe to negotiate step to get inside of house.  Ready for D/C.     Follow Up Recommendations  Home health PT     Does the patient have the potential to tolerate intense rehabilitation     Barriers to Discharge        Equipment Recommendations  None recommended by PT;None recommended by OT    Recommendations for Other Services    Frequency 7X/week   Plan Discharge plan remains appropriate    Precautions / Restrictions Precautions Precautions: Fall Restrictions Weight Bearing Restrictions: No Other Position/Activity Restrictions: WBAT   Pertinent Vitals/Pain 5/10 with ambulation    Mobility  Bed Mobility Bed Mobility: Supine to Sit Supine to Sit: 4: Min guard;HOB flat Details for Bed Mobility Assistance: Guarding assist to ensure safety of RLE into bed.   Transfers Transfers: Sit to Stand;Stand to Sit Sit to Stand: 5: Supervision;From elevated surface;With upper extremity assist;From bed Stand to Sit: 5: Supervision;With upper extremity assist;With armrests;To chair/3-in-1 Details for Transfer Assistance: Min cues for hand placement/safety.  Ambulation/Gait Ambulation/Gait Assistance: 5: Supervision Ambulation Distance (Feet): 120 Feet Assistive device: Rolling walker Ambulation/Gait Assistance Details: min cues for sequencing/technique.  Gait Pattern: Step-to pattern;Decreased stance time - right;Decreased step length - left;Trunk flexed Gait velocity: decreased Stairs: Yes Stairs Assistance: 4: Min guard Stair Management Technique: No rails;Step to pattern;Backwards;Forwards;With walker Number of Stairs: 1  Wheelchair Mobility Wheelchair Mobility: No    Exercises Total  Joint Exercises Ankle Circles/Pumps: AROM;Both;20 reps Quad Sets: AROM;Right;10 reps Short Arc Quad: AROM;Right;10 reps Heel Slides: AAROM;Right;10 reps Hip ABduction/ADduction: AAROM;Right;10 reps   PT Diagnosis:    PT Problem List:   PT Treatment Interventions:     PT Goals Acute Rehab PT Goals PT Goal Formulation: With patient Time For Goal Achievement: 04/06/12 Potential to Achieve Goals: Good Pt will go Supine/Side to Sit: with supervision PT Goal: Supine/Side to Sit - Progress: Progressing toward goal Pt will go Sit to Stand: with supervision PT Goal: Sit to Stand - Progress: Met Pt will Ambulate: 51 - 150 feet;with supervision;with least restrictive assistive device PT Goal: Ambulate - Progress: Met Pt will Go Up / Down Stairs: 1-2 stairs;with supervision;with least restrictive assistive device PT Goal: Up/Down Stairs - Progress: Partly met Pt will Perform Home Exercise Program: with supervision, verbal cues required/provided PT Goal: Perform Home Exercise Program - Progress: Met  Visit Information  Last PT Received On: 04/05/12 Assistance Needed: +1    Subjective Data  Subjective: I'm ready to go home Patient Stated Goal: to get better   Cognition  Overall Cognitive Status: Appears within functional limits for tasks assessed/performed Arousal/Alertness: Awake/alert Orientation Level: Appears intact for tasks assessed Behavior During Session: [facility] for tasks performed    Balance     End of Session PT - End of Session Activity Tolerance: Patient tolerated treatment well Patient left: in chair;with call bell/phone within reach Nurse Communication: Mobility status   GP     Page, Meribeth Mattes 04/05/2012, 9:03 AM

## 2012-04-05 NOTE — Care Management Note (Signed)
    Page 1 of 2   04/05/2012     1:09:17 PM   CARE MANAGEMENT NOTE 04/05/2012  Patient:  Corey Tate, Corey Tate   Account Number:  [id number]  Date Initiated:  04/03/2012  Documentation initiated by:  Johny Shock  Subjective/Objective Assessment:   Pt with HH needs     Action/Plan:   Pt preassigned to [facility], per Genevieve Norlander pt has DME arranged from family. HH will be provided by Genevieve Norlander   Anticipated DC Date:  04/05/2012   Anticipated DC Plan:  HOME W HOME HEALTH SERVICES  In-house referral  NA      DC Planning Services  CM consult      [facility] Choice  HOME HEALTH   Choice offered to / List presented to:  C-1 Patient   DME arranged  Levan Hurst      DME agency  [facility]     [facility] arranged  HH-2 PT      [facility] agency  [facility]   Status of service:  Completed, signed off Medicare Important Message given?  NA - LOS <3 / Initial given by admissions (If response is "NO", the following Medicare IM given date fields will be blank) Date Medicare IM given:   Date Additional Medicare IM given:    Discharge Disposition:  HOME W HOME HEALTH SERVICES  Per UR Regulation:    If discussed at Long Length of Stay Meetings, dates discussed:    Comments:  04/05/2012 Raynelle Bring BSN CCM [phone] Cm spoke with  patient and patient is requesting RW Andrey Cota he currently has 4 wheel walker with seat and was advised by therapist that he needed 2 wheeled RW. [facility] notified and will deliver to his room.  04/04/2012 1500 Request left for d/c MD to write order for [facility] and complete F2F for this Medicare pt. Isidoro Donning RN CCM Case Mgmt phone [phone]  04/03/2012 Per pt , his wife is borrowing DME from family members/friends. Johny Shock RN MPH

## 2012-04-05 NOTE — Progress Notes (Signed)
Subjective: 3 Days Post-Op Procedure(s) (LRB): TOTAL HIP ARTHROPLASTY ANTERIOR APPROACH (Right) Patient reports pain as 10 on 0-10 scale and mild.  Asymptomatic acute blood loss anemia.  Objective: Vital signs in last 24 hours: Temp:  [98.6 F (37 C)-99.8 F (37.7 C)] 98.6 F (37 C) (10/14 0458) Pulse Rate:  [73-81] 74  (10/14 0458) Resp:  [16-20] 20  (10/13 2125) BP: (90-144)/(53-74) 108/67 mmHg (10/14 0458) SpO2:  [91 %-95 %] 91 % (10/14 0458)  Intake/Output from previous day: 10/13 0701 - 10/14 0700 In: 480 [P.O.:480] Out: 1025 [Urine:1025] Intake/Output this shift:     Basename 04/05/12 0420 04/04/12 0438 04/03/12 0416  HGB 8.5* 9.4* 9.5*    Basename 04/05/12 0420 04/04/12 0438  WBC 8.9 9.8  RBC 2.55* 2.77*  HCT 24.2* 26.2*  PLT 135* 136*    Basename 04/03/12 0416  NA 134*  K 4.4  CL 101  CO2 30  BUN 14  CREATININE 1.08  GLUCOSE 113*  CALCIUM 8.4   No results found for this basename: LABPT:2,INR:2 in the last 72 hours  Sensation intact distally Intact pulses distally Dorsiflexion/Plantar flexion intact Incision: no drainage No cellulitis present  Assessment/Plan: 3 Days Post-Op Procedure(s) (LRB): TOTAL HIP ARTHROPLASTY ANTERIOR APPROACH (Right) Discharge home with home health today.  Cliff Damiani Y 04/05/2012, 7:27 AM

## 2012-04-05 NOTE — Discharge Summary (Signed)
Patient ID: Corey Tate MRN: [id number] DOB/AGE: 1940-10-20 71 y.o.  Admit date: 04/02/2012 Discharge date: 04/05/2012  Admission Diagnoses:  Principal Problem:  *Degenerative arthritis of hip   Discharge Diagnoses:  Same  Past Medical History  Diagnosis Date  . Peripheral vascular disease   . Arthritis   . Hypertension   . Dysrhythmia     "SKIPS"  . Shortness of breath     USES OXYGEN AT NIGHT--HX OF RIGHT LOWER LOBE PULMONARY NODULE--FOLLOWED BY PT'S MEDICAL DOCTOR AND HAS HAD FOR YEARS  . GERD (gastroesophageal reflux disease)   . Cancer     SKIN CANCERS    Surgeries: Procedure(s): TOTAL HIP ARTHROPLASTY ANTERIOR APPROACH on 04/02/2012   Consultants:    Discharged Condition: Improved  Hospital Course: Corey Tate is an 71 y.o. male who was admitted 04/02/2012 for operative treatment ofDegenerative arthritis of hip. Patient has severe unremitting pain that affects sleep, daily activities, and work/hobbies. After pre-op clearance the patient was taken to the operating room on 04/02/2012 and underwent  Procedure(s): TOTAL HIP ARTHROPLASTY ANTERIOR APPROACH.    Patient was given perioperative antibiotics: Anti-infectives     Start     Dose/Rate Route Frequency Ordered Stop   04/02/12 2000   ceFAZolin (ANCEF) IVPB 1 g/50 mL premix        1 g 100 mL/hr over 30 Minutes Intravenous Every 6 hours 04/02/12 1614 04/03/12 0209   04/01/12 1626   ceFAZolin (ANCEF) IVPB 2 g/50 mL premix        2 g 100 mL/hr over 30 Minutes Intravenous 60 min pre-op 04/01/12 1626 04/02/12 1252           Patient was given sequential compression devices, early ambulation, and chemoprophylaxis to prevent DVT.  Patient benefited maximally from hospital stay and there were no complications.    Recent vital signs: Patient Vitals for the past 24 hrs:  BP Temp Temp src Pulse Resp SpO2  04/05/12 0458 108/67 mmHg 98.6 F (37 C) Oral 74  - 91 %  04/26/2012 2125 144/74 mmHg 99.8 F (37.7 C)  Oral 81  20  91 %  04-26-2012 1507 141/57 mmHg 99.7 F (37.6 C) Oral 73  16  95 %  04-26-2012 1025 90/53 mmHg - - - - -  04/26/12 1024 90/53 mmHg - - - - -  2012-04-26 0800 - - - - 18  95 %     Recent laboratory studies:  Basename 04/05/12 0420 04-26-12 0438 04/03/12 0416  WBC 8.9 9.8 --  HGB 8.5* 9.4* --  HCT 24.2* 26.2* --  PLT 135* 136* --  NA -- -- 134*  K -- -- 4.4  CL -- -- 101  CO2 -- -- 30  BUN -- -- 14  CREATININE -- -- 1.08  GLUCOSE -- -- 113*  INR -- -- --  CALCIUM -- -- 8.4     Discharge Medications:     Medication List     As of 04/05/2012  7:30 AM    STOP taking these medications         HYDROcodone-acetaminophen 5-325 MG per tablet   Commonly known as: NORCO/VICODIN      TAKE these medications         amLODipine 5 MG tablet   Commonly known as: NORVASC   Take 5 mg by mouth daily before breakfast.      aspirin 325 MG EC tablet   Take 1 tablet (325 mg total) by mouth 2 (two) times  daily.      ferrous sulfate 325 (65 FE) MG tablet   Take 1 tablet (325 mg total) by mouth 3 (three) times daily after meals.      ibuprofen 200 MG tablet   Commonly known as: ADVIL,MOTRIN   Take 400 mg by mouth every 6 (six) hours as needed. Pain      lisinopril 40 MG tablet   Commonly known as: PRINIVIL,ZESTRIL   Take 40 mg by mouth daily before breakfast.      methocarbamol 500 MG tablet   Commonly known as: ROBAXIN   Take 1 tablet (500 mg total) by mouth every 6 (six) hours as needed.      OXYCODONE HCL PO   Take by mouth. RARELY TAKES--ONLY FOR SEVERE PAIN      oxyCODONE-acetaminophen 5-325 MG per tablet   Commonly known as: PERCOCET/ROXICET   Take 1-2 tablets by mouth every 4 (four) hours as needed for pain.        Diagnostic Studies: Dg Chest 2 View  03/29/2012  *RADIOLOGY REPORT*  Clinical Data: Preop.  CHEST - 2 VIEW  Comparison: CT chest 12/30/2010 and chest radiograph 02/11/2010.  Findings: Trachea is midline.  Heart size normal.  Lungs are clear but  hyperinflated.  No pleural fluid.  IMPRESSION: Hyperinflation without acute finding.   Original Report Authenticated By: Reyes Ivan, M.D.    Dg Hip Complete Right  04/02/2012  *RADIOLOGY REPORT*  Clinical Data: Right hip replacement.  DG C-ARM 1-60 MIN - [facility],RIGHT HIP - COMPLETE 2+ VIEW  Comparison: None.  Findings: Two intraoperative views of the right hip reveal total right hip replacement in satisfactory position without complication noted on this single projection.  IMPRESSION: Post right hip replacement which appears in satisfactory position without complication noted on this single projection.   Original Report Authenticated By: Fuller Canada, M.D.    Dg Pelvis Portable  04/02/2012  *RADIOLOGY REPORT*  Clinical Data: Status post right hip arthroplasty.  PORTABLE PELVIS  Comparison: Intraoperative fluoro spot images from the same date.  Findings: The the patient is status post right total hip arthroplasty.  The right hip is located. The femoral and acetabular components are intact.  Gas and fluid within the joint is expected following surgery.  Atherosclerotic calcifications are present within the femoral arteries bilaterally. A Foley catheter is in place.  IMPRESSION:  1.  Status post right total hip arthroplasty without radiographic evidence for complication. 2.  Atherosclerosis.   Original Report Authenticated By: Jamesetta Orleans. MATTERN, M.D.    Dg Hip Portable 1 View Right  04/02/2012  *RADIOLOGY REPORT*  Clinical Data: Postop right hip arthroplasty, anterior approach  PORTABLE RIGHT HIP - 1 VIEW  Comparison: Concurrently obtained frontal radiograph the pelvis  Findings: Single cross-table lateral view demonstrates good location of the femoral head component within the acetabular component.  Postsurgical changes of total hip arthroplasty. Postoperative subcutaneous emphysema and soft tissue swelling is not unexpected.  No evidence of periprosthetic fracture. Calcifications noted  in the femoral arteries.  IMPRESSION: Interval operative changes of total hip arthroplasty.  Femoral head component appears located with respect to the acetabular component on this view.   Original Report Authenticated By: Sterling Big, M.D.    Dg C-arm 1-60 Min-no Report  04/02/2012  *RADIOLOGY REPORT*  Clinical Data: Right hip replacement.  DG C-ARM 1-60 MIN - [facility],RIGHT HIP - COMPLETE 2+ VIEW  Comparison: None.  Findings: Two intraoperative views of the right hip reveal total  right hip replacement in satisfactory position without complication noted on this single projection.  IMPRESSION: Post right hip replacement which appears in satisfactory position without complication noted on this single projection.   Original Report Authenticated By: Fuller Canada, M.D.     Disposition: to home      Discharge Orders    Future Orders Please Complete By Expires   Diet - low sodium heart healthy      Call MD / Call 911      Comments:   If you experience chest pain or shortness of breath, CALL 911 and be transported to the hospital emergency room.  If you develope a fever above 101 F, pus (white drainage) or increased drainage or redness at the wound, or calf pain, call your surgeon's office.   Constipation Prevention      Comments:   Drink plenty of fluids.  Prune juice may be helpful.  You may use a stool softener, such as Colace (over the counter) 100 mg twice a day.  Use MiraLax (over the counter) for constipation as needed.   Increase activity slowly as tolerated      Discharge instructions      Comments:   Increase your activities as comfort allows. Expect thigh leg and foot swelling.  Expect bruising. Get an over the counter stool softener and take daily.   Discharge patient         Follow-up Information    Follow up with Kathryne Hitch, MD. In 2 weeks.   Contact information:   [facility] [address] [place]  [address] [phone]           Signed: Kathryne Hitch 04/05/2012, 7:30 AM

## 2012-07-12 ENCOUNTER — Other Ambulatory Visit: Payer: Self-pay | Admitting: Neurological Surgery

## 2012-07-12 DIAGNOSIS — M79603 Pain in arm, unspecified: Secondary | ICD-10-CM

## 2012-07-16 ENCOUNTER — Ambulatory Visit
Admission: RE | Admit: 2012-07-16 | Discharge: 2012-07-16 | Disposition: A | Discharge status: Home / Self Care | Payer: PRIVATE HEALTH INSURANCE | Source: Ambulatory Visit | Attending: Neurological Surgery | Admitting: Neurological Surgery

## 2012-07-16 VITALS — BP 139/72 | HR 53

## 2012-07-16 DIAGNOSIS — M79603 Pain in arm, unspecified: Secondary | ICD-10-CM

## 2012-07-16 MED ORDER — IOHEXOL 300 MG/ML  SOLN
10.0000 mL | Freq: Once | INTRAMUSCULAR | Status: AC | PRN
  Administered 2012-07-16: 10 mL via INTRATHECAL

## 2012-07-16 MED ORDER — DIAZEPAM 5 MG PO TABS
5.0000 mg | ORAL_TABLET | Freq: Once | ORAL | Status: AC
  Administered 2012-07-16: 5 mg via ORAL

## 2012-09-13 ENCOUNTER — Other Ambulatory Visit: Payer: Self-pay | Admitting: Neurological Surgery

## 2012-09-17 ENCOUNTER — Encounter (HOSPITAL_COMMUNITY): Payer: Self-pay | Admitting: Pharmacy Technician

## 2012-09-22 ENCOUNTER — Encounter (HOSPITAL_COMMUNITY)
Admission: RE | Admit: 2012-09-22 | Discharge: 2012-09-22 | Disposition: A | Discharge status: Home / Self Care | Payer: Medicare Other | Source: Ambulatory Visit | Attending: Neurological Surgery | Admitting: Neurological Surgery

## 2012-09-22 ENCOUNTER — Encounter (HOSPITAL_COMMUNITY): Payer: Self-pay

## 2012-09-22 HISTORY — DX: Unspecified asthma, uncomplicated: J45.909

## 2012-09-22 LAB — CBC WITH DIFFERENTIAL/PLATELET
Basophils Absolute: 0.1 10*3/uL (ref 0.0–0.1)
Basophils Relative: 1 % (ref 0–1)
Eosinophils Absolute: 0.3 10*3/uL (ref 0.0–0.7)
Eosinophils Relative: 4 % (ref 0–5)
HCT: 36.1 % — ABNORMAL LOW (ref 39.0–52.0)
Hemoglobin: 12.6 g/dL — ABNORMAL LOW (ref 13.0–17.0)
Lymphocytes Relative: 27 % (ref 12–46)
Lymphs Abs: 2.2 10*3/uL (ref 0.7–4.0)
MCH: 32.1 pg (ref 26.0–34.0)
MCHC: 34.9 g/dL (ref 30.0–36.0)
MCV: 91.9 fL (ref 78.0–100.0)
Monocytes Absolute: 0.9 10*3/uL (ref 0.1–1.0)
Monocytes Relative: 11 % (ref 3–12)
Neutro Abs: 4.6 10*3/uL (ref 1.7–7.7)
Neutrophils Relative %: 58 % (ref 43–77)
Platelets: 232 10*3/uL (ref 150–400)
RBC: 3.93 MIL/uL — ABNORMAL LOW (ref 4.22–5.81)
RDW: 12.7 % (ref 11.5–15.5)
WBC: 8 10*3/uL (ref 4.0–10.5)

## 2012-09-22 LAB — BASIC METABOLIC PANEL
BUN: 15 mg/dL (ref 6–23)
CO2: 26 mEq/L (ref 19–32)
Calcium: 10.5 mg/dL (ref 8.4–10.5)
Chloride: 96 mEq/L (ref 96–112)
Creatinine, Ser: 0.99 mg/dL (ref 0.50–1.35)
GFR calc Af Amer: >90 mL/min (ref >90)
GFR calc non Af Amer: 82 mL/min — ABNORMAL LOW (ref >90)
Glucose, Bld: 105 mg/dL — ABNORMAL HIGH (ref 70–99)
Potassium: 4.3 mEq/L (ref 3.5–5.1)
Sodium: 134 mEq/L — ABNORMAL LOW (ref 135–145)

## 2012-09-22 LAB — SURGICAL PCR SCREEN
MRSA, PCR: NEGATIVE
Staphylococcus aureus: NEGATIVE

## 2012-09-22 LAB — PROTIME-INR
INR: 0.99 (ref 0.00–1.49)
Prothrombin Time: 13 seconds (ref 11.6–15.2)

## 2012-09-22 LAB — TYPE AND SCREEN
ABO/RH(D): O POS
Antibody Screen: NEGATIVE

## 2012-09-22 LAB — ABO/RH: ABO/RH(D): O POS

## 2012-09-22 NOTE — Pre-Procedure Instructions (Addendum)
Corey Tate  09/22/2012   Your procedure is scheduled on:  09/30/12  Report to [facility] at 815 AM.  Call this number if you have problems the morning of surgery: [phone]   Remember:   Do not eat food or drink liquids after midnight.   Take these medicines the morning of surgery with A SIP OF WATER: norvasc, pain med STOP advil now   Do not wear jewelry, make-up or nail polish.  Do not wear lotions, powders, or perfumes. You may wear deodorant.  Do not shave 48 hours prior to surgery. Men may shave face and neck.  Do not bring valuables to the hospital.  Contacts, dentures or bridgework may not be worn into surgery.  Leave suitcase in the car. After surgery it may be brought to your room.  For patients admitted to the hospital, checkout time is 11:00 AM the day of  discharge.   Patients discharged the day of surgery will not be allowed to drive  home.  Name and phone number of your driver:\  Special Instructions: Shower using CHG 2 nights before surgery and the night before surgery.  If you shower the day of surgery use CHG.  Use special wash - you have one bottle of CHG for all showers.  You should use approximately 1/3 of the bottle for each shower.   Please read over the following fact sheets that you were given: Pain Booklet, Coughing and Deep Breathing, Blood Transfusion Information, MRSA Information and Surgical Site Infection Prevention

## 2012-09-22 NOTE — Progress Notes (Signed)
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

## 2012-09-23 NOTE — Progress Notes (Signed)
Anesthesia Chart Review: Patient is a 72 year old male scheduled for bilateral C7-T1 foraminotomy and posterior cervical fusion by Dr. Yetta Barre on 09/30/12.  History includes smoking, PAD with history of RLE stent and LLE "rotor rooter" procedure, GERD, HTN, asthma, skin cancer, arthritis,  dysrhythmia ("skips"), prior cervical fusion, right THA on 04/02/12.  Preoperative (THA) EKG on 03/29/12 showed SB @ 49 bpm, possible LAE, septal infarct (age undetermined).  Septal infarct findings were new since 11/28/08; however a written report of stress test EKG findings on 12/18/09 (see below) mentioned Q waves in V1 and V2.  Patient has since been evaluated by an anesthesiologist and under THA in 03/2012.  He had a stress and echo at [facility] on 12/18/09.  Echo showed preserved LV EF 55%, mild to moderate TR with normal pulmonary artery pressure.  Nuclear stress test showed no ischemia, normal gated imaging, normal EF, fixed defect involving inferior wall is most likely related to diaphragmatic attenuation. Resting EKG with NSR, APCs, septal Q waves, non-specific ST/T segment changes.   CXR on 03/29/12 showed hyperinflation without acute findings.  Preoperative labs noted.  Patient had no ischemia on stress test in 2011.  He tolerated a right THA six months ago.  If no new CV symptoms or change in his status then would anticipate he could proceed as planned.  Velna Ochs [facility]/Anesthesiology Phone [phone] 09/23/2012 2:47 PM

## 2012-09-29 MED ORDER — CEFAZOLIN SODIUM-DEXTROSE 2-3 GM-% IV SOLR
2.0000 g | INTRAVENOUS | Status: AC
  Administered 2012-09-30: 2 g via INTRAVENOUS
  Filled 2012-09-29: qty 50

## 2012-09-30 ENCOUNTER — Encounter (HOSPITAL_COMMUNITY)
Admission: RE | Disposition: A | Discharge status: Home / Self Care | Payer: Self-pay | Source: Ambulatory Visit | Attending: Neurological Surgery

## 2012-09-30 ENCOUNTER — Encounter (HOSPITAL_COMMUNITY): Payer: Self-pay | Admitting: Vascular Surgery

## 2012-09-30 ENCOUNTER — Encounter (HOSPITAL_COMMUNITY): Payer: Self-pay | Admitting: *Deleted

## 2012-09-30 ENCOUNTER — Ambulatory Visit (HOSPITAL_COMMUNITY): Payer: Medicare Other | Admitting: Anesthesiology

## 2012-09-30 ENCOUNTER — Inpatient Hospital Stay (HOSPITAL_COMMUNITY)
Admission: RE | Admit: 2012-09-30 | Discharge: 2012-10-01 | DRG: 473 | Disposition: A | Discharge status: Home / Self Care | Payer: Medicare Other | Source: Ambulatory Visit | Attending: Neurological Surgery | Admitting: Neurological Surgery

## 2012-09-30 ENCOUNTER — Ambulatory Visit (HOSPITAL_COMMUNITY): Payer: Medicare Other

## 2012-09-30 DIAGNOSIS — Z981 Arthrodesis status: Secondary | ICD-10-CM

## 2012-09-30 DIAGNOSIS — J4489 Other specified chronic obstructive pulmonary disease: Secondary | ICD-10-CM | POA: Diagnosis present

## 2012-09-30 DIAGNOSIS — J449 Chronic obstructive pulmonary disease, unspecified: Secondary | ICD-10-CM | POA: Diagnosis present

## 2012-09-30 DIAGNOSIS — I10 Essential (primary) hypertension: Secondary | ICD-10-CM | POA: Diagnosis present

## 2012-09-30 DIAGNOSIS — M129 Arthropathy, unspecified: Secondary | ICD-10-CM | POA: Diagnosis present

## 2012-09-30 DIAGNOSIS — M47812 Spondylosis without myelopathy or radiculopathy, cervical region: Principal | ICD-10-CM | POA: Diagnosis present

## 2012-09-30 DIAGNOSIS — Z859 Personal history of malignant neoplasm, unspecified: Secondary | ICD-10-CM

## 2012-09-30 DIAGNOSIS — Z79899 Other long term (current) drug therapy: Secondary | ICD-10-CM

## 2012-09-30 DIAGNOSIS — I739 Peripheral vascular disease, unspecified: Secondary | ICD-10-CM | POA: Diagnosis present

## 2012-09-30 DIAGNOSIS — K219 Gastro-esophageal reflux disease without esophagitis: Secondary | ICD-10-CM | POA: Diagnosis present

## 2012-09-30 HISTORY — PX: POSTERIOR CERVICAL FUSION/FORAMINOTOMY: SHX5038

## 2012-09-30 SURGERY — POSTERIOR CERVICAL FUSION/FORAMINOTOMY LEVEL 1
Anesthesia: General | Laterality: Bilateral | Wound class: Clean

## 2012-09-30 MED ORDER — ROCURONIUM BROMIDE 100 MG/10ML IV SOLN
INTRAVENOUS | Status: DC | PRN
  Administered 2012-09-30: 50 mg via INTRAVENOUS

## 2012-09-30 MED ORDER — NEOSTIGMINE METHYLSULFATE 1 MG/ML IJ SOLN
INTRAMUSCULAR | Status: DC | PRN
  Administered 2012-09-30: 5 mg via INTRAVENOUS

## 2012-09-30 MED ORDER — SODIUM CHLORIDE 0.9 % IJ SOLN: 3.0000 mL | INTRAMUSCULAR | Status: DC | PRN

## 2012-09-30 MED ORDER — LIDOCAINE HCL 4 % MT SOLN
OROMUCOSAL | Status: DC | PRN
  Administered 2012-09-30: 4 mL via TOPICAL

## 2012-09-30 MED ORDER — DEXAMETHASONE 4 MG PO TABS
4.0000 mg | ORAL_TABLET | Freq: Four times a day (QID) | ORAL | Status: DC
  Administered 2012-09-30: 4 mg via ORAL
  Filled 2012-09-30 (×5): qty 1

## 2012-09-30 MED ORDER — CYCLOBENZAPRINE HCL 10 MG PO TABS: 10.0000 mg | ORAL_TABLET | Freq: Three times a day (TID) | ORAL | Status: DC | PRN

## 2012-09-30 MED ORDER — EPHEDRINE SULFATE 50 MG/ML IJ SOLN
INTRAMUSCULAR | Status: DC | PRN
  Administered 2012-09-30 (×2): 10 mg via INTRAVENOUS
  Administered 2012-09-30: 15 mg via INTRAVENOUS

## 2012-09-30 MED ORDER — ACETAMINOPHEN 325 MG PO TABS: 650.0000 mg | ORAL_TABLET | ORAL | Status: DC | PRN

## 2012-09-30 MED ORDER — SODIUM CHLORIDE 0.9 % IV SOLN
INTRAVENOUS | Status: AC
  Filled 2012-09-30: qty 500

## 2012-09-30 MED ORDER — 0.9 % SODIUM CHLORIDE (POUR BTL) OPTIME
TOPICAL | Status: DC | PRN
  Administered 2012-09-30: 1000 mL

## 2012-09-30 MED ORDER — ONDANSETRON HCL 4 MG/2ML IJ SOLN
INTRAMUSCULAR | Status: DC | PRN
  Administered 2012-09-30: 4 mg via INTRAVENOUS

## 2012-09-30 MED ORDER — SODIUM CHLORIDE 0.9 % IJ SOLN: 3.0000 mL | Freq: Two times a day (BID) | INTRAMUSCULAR | Status: DC

## 2012-09-30 MED ORDER — ONDANSETRON HCL 4 MG/2ML IJ SOLN: 4.0000 mg | INTRAMUSCULAR | Status: DC | PRN

## 2012-09-30 MED ORDER — ACETAMINOPHEN 650 MG RE SUPP: 650.0000 mg | RECTAL | Status: DC | PRN

## 2012-09-30 MED ORDER — ACETAMINOPHEN 10 MG/ML IV SOLN
INTRAVENOUS | Status: AC
  Administered 2012-09-30: 1000 mg via INTRAVENOUS
  Filled 2012-09-30: qty 100

## 2012-09-30 MED ORDER — ALBUTEROL SULFATE HFA 108 (90 BASE) MCG/ACT IN AERS
2.0000 | INHALATION_SPRAY | Freq: Four times a day (QID) | RESPIRATORY_TRACT | Status: DC | PRN
  Filled 2012-09-30: qty 6.7

## 2012-09-30 MED ORDER — PROPOFOL 10 MG/ML IV BOLUS
INTRAVENOUS | Status: DC | PRN
  Administered 2012-09-30: 200 mg via INTRAVENOUS

## 2012-09-30 MED ORDER — PHENYLEPHRINE HCL 10 MG/ML IJ SOLN
INTRAMUSCULAR | Status: DC | PRN
  Administered 2012-09-30: 40 ug via INTRAVENOUS
  Administered 2012-09-30: 80 ug via INTRAVENOUS
  Administered 2012-09-30: 40 ug via INTRAVENOUS
  Administered 2012-09-30 (×4): 80 ug via INTRAVENOUS

## 2012-09-30 MED ORDER — HEMOSTATIC AGENTS (NO CHARGE) OPTIME
TOPICAL | Status: DC | PRN
  Administered 2012-09-30: 1 via TOPICAL

## 2012-09-30 MED ORDER — MORPHINE SULFATE 2 MG/ML IJ SOLN: 1.0000 mg | INTRAMUSCULAR | Status: DC | PRN

## 2012-09-30 MED ORDER — FENTANYL CITRATE 0.05 MG/ML IJ SOLN
INTRAMUSCULAR | Status: DC | PRN
  Administered 2012-09-30: 100 ug via INTRAVENOUS
  Administered 2012-09-30: 50 ug via INTRAVENOUS
  Administered 2012-09-30: 100 ug via INTRAVENOUS

## 2012-09-30 MED ORDER — MENTHOL 3 MG MT LOZG: 1.0000 | LOZENGE | OROMUCOSAL | Status: DC | PRN

## 2012-09-30 MED ORDER — DEXAMETHASONE SODIUM PHOSPHATE 10 MG/ML IJ SOLN
10.0000 mg | INTRAMUSCULAR | Status: AC
  Administered 2012-09-30: 10 mg via INTRAVENOUS
  Filled 2012-09-30: qty 1

## 2012-09-30 MED ORDER — POTASSIUM CHLORIDE IN NACL 20-0.9 MEQ/L-% IV SOLN
INTRAVENOUS | Status: DC
  Filled 2012-09-30 (×3): qty 1000

## 2012-09-30 MED ORDER — LACTATED RINGERS IV SOLN
INTRAVENOUS | Status: DC | PRN
  Administered 2012-09-30 (×2): via INTRAVENOUS

## 2012-09-30 MED ORDER — HYDROMORPHONE HCL PF 1 MG/ML IJ SOLN
INTRAMUSCULAR | Status: AC
  Filled 2012-09-30: qty 1

## 2012-09-30 MED ORDER — LIDOCAINE HCL (CARDIAC) 20 MG/ML IV SOLN
INTRAVENOUS | Status: DC | PRN
  Administered 2012-09-30: 50 mg via INTRAVENOUS

## 2012-09-30 MED ORDER — OXYCODONE-ACETAMINOPHEN 5-325 MG PO TABS
1.0000 | ORAL_TABLET | ORAL | Status: DC | PRN
  Administered 2012-09-30: 2 via ORAL
  Administered 2012-10-01: 1 via ORAL
  Filled 2012-09-30: qty 1
  Filled 2012-09-30: qty 2

## 2012-09-30 MED ORDER — ACETAMINOPHEN 10 MG/ML IV SOLN
1000.0000 mg | Freq: Four times a day (QID) | INTRAVENOUS | Status: DC
Start: 2012-09-30 — End: 2012-10-01
  Administered 2012-09-30 – 2012-10-01 (×3): 1000 mg via INTRAVENOUS
  Filled 2012-09-30 (×4): qty 100

## 2012-09-30 MED ORDER — PHENOL 1.4 % MT LIQD: 1.0000 | OROMUCOSAL | Status: DC | PRN

## 2012-09-30 MED ORDER — BACITRACIN 50000 UNITS IM SOLR
INTRAMUSCULAR | Status: AC
  Filled 2012-09-30: qty 1

## 2012-09-30 MED ORDER — HYDROMORPHONE HCL PF 1 MG/ML IJ SOLN
0.2500 mg | INTRAMUSCULAR | Status: DC | PRN
  Administered 2012-09-30 (×4): 0.5 mg via INTRAVENOUS

## 2012-09-30 MED ORDER — AMLODIPINE BESYLATE 5 MG PO TABS
5.0000 mg | ORAL_TABLET | Freq: Every day | ORAL | Status: DC
  Administered 2012-10-01: 5 mg via ORAL
  Filled 2012-09-30 (×2): qty 1

## 2012-09-30 MED ORDER — SODIUM CHLORIDE 0.9 % IV SOLN
10.0000 mg | INTRAVENOUS | Status: DC | PRN
  Administered 2012-09-30: 25 ug/min via INTRAVENOUS

## 2012-09-30 MED ORDER — CEFAZOLIN SODIUM 1-5 GM-% IV SOLN
1.0000 g | Freq: Three times a day (TID) | INTRAVENOUS | Status: AC
  Administered 2012-09-30 (×2): 1 g via INTRAVENOUS
  Filled 2012-09-30 (×2): qty 50

## 2012-09-30 MED ORDER — DEXAMETHASONE SODIUM PHOSPHATE 4 MG/ML IJ SOLN
4.0000 mg | Freq: Four times a day (QID) | INTRAMUSCULAR | Status: DC
  Administered 2012-09-30 – 2012-10-01 (×2): 4 mg via INTRAVENOUS
  Filled 2012-09-30 (×6): qty 1

## 2012-09-30 MED ORDER — THROMBIN 5000 UNITS EX SOLR
CUTANEOUS | Status: DC | PRN
  Administered 2012-09-30 (×2): 5000 [IU] via TOPICAL

## 2012-09-30 MED ORDER — VECURONIUM BROMIDE 10 MG IV SOLR
INTRAVENOUS | Status: DC | PRN
  Administered 2012-09-30: 2 mg via INTRAVENOUS

## 2012-09-30 MED ORDER — SODIUM CHLORIDE 0.9 % IR SOLN
Status: DC | PRN
  Administered 2012-09-30: 12:00:00

## 2012-09-30 MED ORDER — LISINOPRIL 40 MG PO TABS
40.0000 mg | ORAL_TABLET | Freq: Every day | ORAL | Status: DC
  Administered 2012-10-01: 40 mg via ORAL
  Filled 2012-09-30 (×2): qty 1

## 2012-09-30 MED ORDER — GLYCOPYRROLATE 0.2 MG/ML IJ SOLN
INTRAMUSCULAR | Status: DC | PRN
  Administered 2012-09-30: 0.2 mg via INTRAVENOUS
  Administered 2012-09-30: .6 mg via INTRAVENOUS

## 2012-09-30 MED ORDER — BUPIVACAINE HCL (PF) 0.25 % IJ SOLN
INTRAMUSCULAR | Status: DC | PRN
  Administered 2012-09-30: 10 mL

## 2012-09-30 MED ORDER — ONDANSETRON HCL 4 MG/2ML IJ SOLN: 4.0000 mg | Freq: Once | INTRAMUSCULAR | Status: DC | PRN

## 2012-09-30 MED ORDER — ALBUMIN HUMAN 5 % IV SOLN
INTRAVENOUS | Status: DC | PRN
  Administered 2012-09-30: 12:00:00 via INTRAVENOUS

## 2012-09-30 MED ORDER — ESMOLOL HCL 10 MG/ML IV SOLN
INTRAVENOUS | Status: DC | PRN
  Administered 2012-09-30: 50 mg via INTRAVENOUS

## 2012-09-30 MED ORDER — ARTIFICIAL TEARS OP OINT
TOPICAL_OINTMENT | OPHTHALMIC | Status: DC | PRN
  Administered 2012-09-30: 1 via OPHTHALMIC

## 2012-09-30 SURGICAL SUPPLY — 57 items
APL SKNCLS STERI-STRIP NONHPOA (GAUZE/BANDAGES/DRESSINGS) ×1
BAG DECANTER FOR FLEXI CONT (MISCELLANEOUS) ×2 IMPLANT
BENZOIN TINCTURE PRP APPL 2/3 (GAUZE/BANDAGES/DRESSINGS) ×2 IMPLANT
BIT DRILL VUEPOINT II (BIT) IMPLANT
BLADE SURG ROTATE 9660 (MISCELLANEOUS) IMPLANT
BONE MATRIX OSTEOCEL PLUS 1CC (Bone Implant) ×2 IMPLANT
BUR MATCHSTICK NEURO 3.0 LAGG (BURR) ×1 IMPLANT
CANISTER SUCTION 2500CC (MISCELLANEOUS) ×2 IMPLANT
CLOTH BEACON ORANGE TIMEOUT ST (SAFETY) ×2 IMPLANT
CONT SPEC 4OZ CLIKSEAL STRL BL (MISCELLANEOUS) ×2 IMPLANT
DRAPE C-ARM 42X72 X-RAY (DRAPES) ×4 IMPLANT
DRAPE LAPAROTOMY 100X72 PEDS (DRAPES) ×2 IMPLANT
DRAPE POUCH INSTRU U-SHP 10X18 (DRAPES) ×2 IMPLANT
DRESSING TELFA 8X3 (GAUZE/BANDAGES/DRESSINGS) ×2 IMPLANT
DRILL BIT VUEPOINT II (BIT) ×2
DRSG OPSITE 4X5.5 SM (GAUZE/BANDAGES/DRESSINGS) ×4 IMPLANT
DRSG OPSITE POSTOP 4X6 (GAUZE/BANDAGES/DRESSINGS) ×1 IMPLANT
DURAPREP 26ML APPLICATOR (WOUND CARE) ×2 IMPLANT
ELECT REM PT RETURN 9FT ADLT (ELECTROSURGICAL) ×2
ELECTRODE REM PT RTRN 9FT ADLT (ELECTROSURGICAL) ×1 IMPLANT
EVACUATOR 1/8 PVC DRAIN (DRAIN) ×1 IMPLANT
GAUZE SPONGE 4X4 16PLY XRAY LF (GAUZE/BANDAGES/DRESSINGS) IMPLANT
GLOVE BIOGEL M 8.0 STRL (GLOVE) ×4 IMPLANT
GLOVE INDICATOR 7.0 STRL GRN (GLOVE) ×2 IMPLANT
GLOVE INDICATOR 7.5 STRL GRN (GLOVE) ×2 IMPLANT
GLOVE SS BIOGEL STRL SZ 6.5 (GLOVE) IMPLANT
GLOVE SUPERSENSE BIOGEL SZ 6.5 (GLOVE) ×3
GOWN BRE IMP SLV AUR LG STRL (GOWN DISPOSABLE) ×4 IMPLANT
GOWN BRE IMP SLV AUR XL STRL (GOWN DISPOSABLE) ×5 IMPLANT
GOWN STRL REIN 2XL LVL4 (GOWN DISPOSABLE) IMPLANT
HEMOSTAT POWDER KIT SURGIFOAM (HEMOSTASIS) IMPLANT
KIT BASIN OR (CUSTOM PROCEDURE TRAY) ×2 IMPLANT
KIT ROOM TURNOVER OR (KITS) ×2 IMPLANT
MARKER SKIN DUAL TIP RULER LAB (MISCELLANEOUS) ×2 IMPLANT
NDL HYPO 25X1 1.5 SAFETY (NEEDLE) ×1 IMPLANT
NDL SPNL 20GX3.5 QUINCKE YW (NEEDLE) IMPLANT
NEEDLE HYPO 25X1 1.5 SAFETY (NEEDLE) ×2 IMPLANT
NEEDLE SPNL 20GX3.5 QUINCKE YW (NEEDLE) IMPLANT
NS IRRIG 1000ML POUR BTL (IV SOLUTION) ×2 IMPLANT
PACK LAMINECTOMY NEURO (CUSTOM PROCEDURE TRAY) ×2 IMPLANT
PAD ARMBOARD 7.5X6 YLW CONV (MISCELLANEOUS) ×3 IMPLANT
PIN MAYFIELD SKULL DISP (PIN) ×2 IMPLANT
ROD VUEPOINT 3.5X60 (Rod) ×1 IMPLANT
SCREW 4.0X20MM (Screw) ×2 IMPLANT
SCREW MA MM 3.5X14 (Screw) ×2 IMPLANT
SCREW SET THREADED (Screw) ×4 IMPLANT
SPONGE LAP 4X18 X RAY DECT (DISPOSABLE) IMPLANT
SPONGE SURGIFOAM ABS GEL SZ50 (HEMOSTASIS) ×2 IMPLANT
STRIP CLOSURE SKIN 1/2X4 (GAUZE/BANDAGES/DRESSINGS) ×2 IMPLANT
SUT VIC AB 0 CT1 18XCR BRD8 (SUTURE) ×1 IMPLANT
SUT VIC AB 0 CT1 8-18 (SUTURE) ×2
SUT VIC AB 2-0 CP2 18 (SUTURE) ×2 IMPLANT
SUT VIC AB 3-0 SH 8-18 (SUTURE) ×4 IMPLANT
SYR 20ML ECCENTRIC (SYRINGE) ×2 IMPLANT
TOWEL OR 17X24 6PK STRL BLUE (TOWEL DISPOSABLE) ×2 IMPLANT
TOWEL OR 17X26 10 PK STRL BLUE (TOWEL DISPOSABLE) ×2 IMPLANT
WATER STERILE IRR 1000ML POUR (IV SOLUTION) ×2 IMPLANT

## 2012-09-30 NOTE — Op Note (Signed)
09/30/2012  1:39 PM  PATIENT:  Corey Tate  72 y.o. 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 Posterior cervical fusion C7-T1 with local autograft and morcellized allograft, 2. Decompressive bilateral cervical hemilaminectomy, medial facetectomy and foraminotomies for decompression of the C8 nerve roots, 3. Lateral mass and pedicle screw non-segmental fixation C7-T1 with Nuvasive screws  SURGEON:  Marikay Alar, MD  ASSISTANTS: Jeral Fruit  ANESTHESIA:   General  EBL: 75 ml  Total I/O In: 1850 [I.V.:1600; IV Piggyback:250] Out: 75 [Blood:75]  BLOOD ADMINISTERED:none  DRAINS: Medium Hemovac   SPECIMEN:  No Specimen  INDICATION FOR PROCEDURE: This patient underwent a previous C4-C7 anterior cervical fusion. He has had many months of posterior neck pain with radiation into the arms in the C8 distribution with associated numbness and tingling. The symptoms were progressive. CT myelogram showed subluxation with stenosis at C7-T1. Recommended a posterior cervical decompression and fusion. Patient understood the risks, benefits, and alternatives and potential outcomes and wished to proceed.  PROCEDURE DETAILS: The patient was brought to the operating room. Generalized endotracheal anesthesia was induced. The patient was affixed a 3 point Mayfield headrest and rolled into the prone position on chest rolls. All pressure points were padded. The posterior cervical region was cleaned and prepped with DuraPrep and then draped in the usual sterile fashion. 7 cc of local anesthesia was injected and a dorsal midline incision made in the posterior cervical region and carried down to the cervical fascia. The fascia was opened and the paraspinous musculature was taken down to expose C7-T1. Intraoperative fluoroscopy confirmed my level and then the dissection was carried  out over the lateral facets. I localized the midpoint of each lateral mass of C7 and marked a region 1 mm medial to the midpoint of the lateral mass, and then drilled in an upper and outward direction into the safe zone of each lateral mass. I drilled to a depth of 14 mm and then checked my drill hole with a ball probe. I then placed a 14 mm lateral mass screws into the safe zone of each lateral mass of C7 bilaterally until they were 2 fingers tight. I then decompressed the neural foramina at C7-T1 performing  keyhole foraminotomies with the high-speed drill and with the 1 and 2 mm Kerrison punch. Medial facetectomies were performed, and foraminotomies were performed at C7-T1 to decompress the C8 nerve roots bilaterally. Once the decompression was complete I could pass I nerve hook easily along the C8 nerve root until I was distal to the pedicle. I then identified the pedicles bilaterally, decorticated my pedicle screw entry zone, and then drilled with the hand drill and guide to a depth of 20 mm. Palpated the hole with a ball probe and then tapped and then placed 40 by 20 mm pedicle screws at T1 bilaterally.  I then decorticated the lateral masses at C7-T1 and placed a mixture of local autograft and morcellized allograft. I then placed rods into the multiaxial screw heads of the screws and locked these in position with the locking caps and anti-torque device. I then checked the final construct with fluoroscopy. I irrigated with saline solution containing bacitracin. A placed a medium Hemovac drain through separate stab incision, and lying the dura with Gelfoam. After hemostasis was achieved a closed the muscle and the fascia with 0 Vicryl, subcutaneous tissue with 2-0 Vicryl, and the subcuticular tissue  with 3-0 Vicryl. The skin was closed with benzoin and Steri-Strips. A sterile dressing was applied, the patient was turned to the supine physician and taken out of the headrest, awakened from general anesthesia and  transferred to the recovery room in stable condition. At the end of the procedure all sponge, needle and instrument counts were correct.   PLAN OF CARE: Admit for overnight observation  PATIENT DISPOSITION:  PACU - hemodynamically stable.   Delay start of Pharmacological VTE agent (>24hrs) due to surgical blood loss or risk of bleeding:  yes

## 2012-09-30 NOTE — Plan of Care (Signed)
Problem: Consults Goal: Diagnosis - Spinal Surgery Outcome: Completed/Met Date Met:  09/30/12 Cervical Spine Fusion     

## 2012-09-30 NOTE — H&P (Signed)
Subjective:   Patient is a 72 y.o. male admitted for PCF C7-T1. The patient first presented to me with complaints of neck pain, shooting pains in the arm(s) and numbness of the arm(s). Onset of symptoms was several months ago. The pain is described as aching and occurs all day. The pain is rated moderate, and is located at the base of the neck and radiates to the arms. The symptoms have been progressive. Symptoms are exacerbated by extending head backwards, and are relieved by none.  Previous work up includes CT of cervical spine, results: Subluxation C7-T1 with stenosis.  Past Medical History  Diagnosis Date  . Peripheral vascular disease   . Arthritis   . Hypertension   . Dysrhythmia     "SKIPS"  . Shortness of breath     USES OXYGEN AT NIGHT--HX OF RIGHT LOWER LOBE PULMONARY NODULE--FOLLOWED BY PT'S MEDICAL DOCTOR AND HAS HAD FOR YEARS  . GERD (gastroesophageal reflux disease)   . Cancer     SKIN CANCERS  . Asthma     ?    Past Surgical History  Procedure Laterality Date  . Vascular surgery      STENT PLACEMENT RIGHT LEG AND "ROTOR ROOTER" LEFT LEG  . Cervical disc surgery      FUSION - ONLY SLIGHT LIMITATION IN NECK MOVEMENT  . Carpal tunnel release and surgery left elbow    . Right shoulder surgery    . Back surgery      LOWER BACK SURGERY X 3 - FUSION  . Total hip arthroplasty  04/02/2012    Procedure: TOTAL HIP ARTHROPLASTY ANTERIOR APPROACH;  Surgeon: Kathryne Hitch, MD;  Location: [facility];  Service: Orthopedics;  Laterality: Right;  Right Total Hip Arthroplasty    Allergies  Allergen Reactions  . Ferra-Caps (Iron) Hives  . Streptomycin Hives and Itching    History  Substance Use Topics  . 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 Smokeless tobacco: Never Used  . Alcohol Use: 14.4 oz/week    24 Cans of beer per week     Comment: 6 BEERS A DAY    History reviewed. No pertinent family history. Prior to  Admission medications   Medication Sig Start Date End Date Taking? Authorizing Provider  albuterol (PROVENTIL HFA;VENTOLIN HFA) 108 (90 BASE) MCG/ACT inhaler Inhale 2 puffs into the lungs every 6 (six) hours as needed for wheezing or shortness of breath.   Yes Historical Provider, MD  amLODipine (NORVASC) 5 MG tablet Take 5 mg by mouth daily before breakfast.   Yes Historical Provider, MD  Cholecalciferol (VITAMIN D-3 PO) Take 1 tablet by mouth daily.   Yes Historical Provider, MD  fluticasone (FLONASE) 50 MCG/ACT nasal spray Place 2 sprays into the nose daily.   Yes Historical Provider, MD  HYDROcodone-acetaminophen (NORCO/VICODIN) 5-325 MG per tablet Take 1 tablet by mouth daily as needed for pain.   Yes Historical Provider, MD  ibuprofen (ADVIL,MOTRIN) 200 MG tablet Take 400 mg by mouth every 6 (six) hours as needed. Pain   Yes Historical Provider, MD  lisinopril (PRINIVIL,ZESTRIL) 40 MG tablet Take 40 mg by mouth daily before breakfast.   Yes Historical Provider, MD  oxyCODONE-acetaminophen (PERCOCET/ROXICET) 5-325 MG per tablet Take 1 tablet by mouth daily as needed for pain.   Yes Historical Provider, MD  Thiamine HCl (VITAMIN B-1 PO) Take 1 tablet by mouth daily.   Yes Historical Provider, MD  Review of Systems  Positive ROS: neg  All other systems have been reviewed and were otherwise negative with the exception of those mentioned in the HPI and as above.  Objective: Vital signs in last 24 hours: Temp:  [98.1 F (36.7 C)] 98.1 F (36.7 C) (04/10 0834) Pulse Rate:  [77] 77 (04/10 0834) Resp:  [20] 20 (04/10 0834) BP: (116)/(75) 116/75 mmHg (04/10 0834) SpO2:  [93 %] 93 % (04/10 0834)  General Appearance: Alert, cooperative, no distress, appears stated age Head: Normocephalic, without obvious abnormality, atraumatic Eyes: PERRL, conjunctiva/corneas clear, EOM's intact  l Neck: Supple Back: Symmetric, no curvature, ROM normal, no CVA tenderness Lungs: , respirations  unlabored Heart: Regular rate and rhythm Abdomen: Soft Extremities: Extremities normal, atraumatic, no cyanosis or edema Pulses: 2+ and symmetric all extremities Skin: Skin color, texture, turgor normal, no rashes or lesions  NEUROLOGIC:  Mental status: Alert and oriented x4, no aphasia, good attention span, fund of knowledge and memory  Motor Exam - grossly normal Sensory Exam - grossly normal Reflexes: 1+ Coordination - grossly normal Gait - grossly normal Balance - grossly normal Cranial Nerves: I: smell Not tested  II: visual acuity  OS: nl    OD: nl  II: visual fields Full to confrontation  II: pupils Equal, round, reactive to light  III,VII: ptosis None  III,IV,VI: extraocular muscles  Full ROM  V: mastication Normal  V: facial light touch sensation  Normal  V,VII: corneal reflex  Present  VII: facial muscle function - upper  Normal  VII: facial muscle function - lower Normal  VIII: hearing Not tested  IX: soft palate elevation  Normal  IX,X: gag reflex Present  XI: trapezius strength  5/5  XI: sternocleidomastoid strength 5/5  XI: neck flexion strength  5/5  XII: tongue strength  Normal    Data Review Lab Results  Component Value Date   WBC 8.0 09/22/2012   HGB 12.6* 09/22/2012   HCT 36.1* 09/22/2012   MCV 91.9 09/22/2012   PLT 232 09/22/2012   Lab Results  Component Value Date   NA 134* 09/22/2012   K 4.3 09/22/2012   CL 96 09/22/2012   CO2 26 09/22/2012   BUN 15 09/22/2012   CREATININE 0.99 09/22/2012   GLUCOSE 105* 09/22/2012   Lab Results  Component Value Date   INR 0.99 09/22/2012    Assessment:   Cervical neck pain with herniated nucleus pulposus/ spondylosis/ stenosis at C7-T1. Patient has failed conservative therapy. Planned surgery : PCF C7-T1  Plan:   I explained the condition and procedure to the patient and answered any questions.  Patient wishes to proceed with procedure as planned. Understands risks/ benefits/ and expected or typical  outcomes.  Corey Tate S 09/30/2012 10:53 AM

## 2012-09-30 NOTE — Anesthesia Preprocedure Evaluation (Addendum)
Anesthesia Evaluation  Patient identified by MRN, date of birth, ID band Patient awake    Reviewed: Allergy & Precautions, H&P , NPO status , Patient's Chart, lab work & pertinent test results  History of Anesthesia Complications Negative for: history of anesthetic complications  Airway Mallampati: I TM Distance: >3 FB Neck ROM: full    Dental  (+) Edentulous Upper, Edentulous Lower and Dental Advisory Given   Pulmonary shortness of breath and with exertion, asthma , COPD COPD inhaler, Current Smoker,          Cardiovascular hypertension, Pt. on medications + Peripheral Vascular Disease + dysrhythmias (PVCs) Rhythm:regular Rate:Normal  PAD with history of RLE stent and LLE "rotor rooter" procedure  Stress and echo at [facility] on 12/18/09.  Echo showed preserved LV EF 55%, mild to moderate TR with normal pulmonary artery pressure.  Nuclear stress test showed no ischemia, normal gated imaging, normal EF, fixed defect involving inferior wall is most likely related to diaphragmatic attenuation. Resting EKG with NSR, APCs, septal Q waves, non-specific ST/T segment changes   Neuro/Psych Prior post cervical fusion  Neuromuscular disease negative psych ROS   GI/Hepatic Neg liver ROS, GERD-  Medicated and Controlled,  Endo/Other  negative endocrine ROS  Renal/GU negative Renal ROS     Musculoskeletal  (+) Arthritis -, Osteoarthritis,    Abdominal   Peds  Hematology negative hematology ROS (+)   Anesthesia Other Findings   Reproductive/Obstetrics                       Anesthesia Physical Anesthesia Plan  ASA: III  Anesthesia Plan: General   Post-op Pain Management:    Induction: Intravenous  Airway Management Planned: Oral ETT  Additional Equipment:   Intra-op Plan:   Post-operative Plan: Extubation in OR  Informed Consent: I have reviewed the patients History and Physical, chart, labs  and discussed the procedure including the risks, benefits and alternatives for the proposed anesthesia with the patient or authorized representative who has indicated his/her understanding and acceptance.     Plan Discussed with: CRNA, Anesthesiologist and Surgeon  Anesthesia Plan Comments:         Anesthesia Quick Evaluation

## 2012-09-30 NOTE — Preoperative (Signed)
Beta Blockers   Reason not to administer Beta Blockers:Not Applicable 

## 2012-09-30 NOTE — Transfer of Care (Signed)
Immediate Anesthesia Transfer of Care Note  Patient: Corey Tate  Procedure(s) Performed: Procedure(s): CERVICAL SEVEN AND THORACIC ONE BILATERAL POSTERIOR CERVICAL FUSION/FORAMINOTOMY  (Bilateral)  Patient Location: PACU  Anesthesia Type:General  Level of Consciousness: awake and patient cooperative  Airway & Oxygen Therapy: Patient Spontanous Breathing and Patient connected to face mask oxygen  Post-op Assessment: Report given to PACU RN and Post -op Vital signs reviewed and stable  Post vital signs: Reviewed and stable  Complications: No apparent anesthesia complications

## 2012-09-30 NOTE — Anesthesia Procedure Notes (Signed)
Procedure Name: Intubation Date/Time: 09/30/2012 11:20 AM Performed by: Leona Singleton A Pre-anesthesia Checklist: Patient identified Patient Re-evaluated:Patient Re-evaluated prior to inductionOxygen Delivery Method: Circle system utilized Preoxygenation: Pre-oxygenation with 100% oxygen Intubation Type: IV induction Ventilation: Mask ventilation without difficulty Laryngoscope Size: Miller and 2 Grade View: Grade I Tube type: Oral Tube size: 7.5 mm Number of attempts: 1 Airway Equipment and Method: Stylet and LTA kit utilized Placement Confirmation: ETT inserted through vocal cords under direct vision,  positive ETCO2 and breath sounds checked- equal and bilateral Secured at: 21 cm Tube secured with: Tape Dental Injury: Teeth and Oropharynx as per pre-operative assessment

## 2012-09-30 NOTE — Anesthesia Postprocedure Evaluation (Signed)
  Anesthesia Post-op Note  Patient: Corey Tate  Procedure(s) Performed: Procedure(s): CERVICAL SEVEN AND THORACIC ONE BILATERAL POSTERIOR CERVICAL FUSION/FORAMINOTOMY  (Bilateral)  Patient Location: PACU  Anesthesia Type:General  Level of Consciousness: awake, oriented and patient cooperative  Airway and Oxygen Therapy: Patient Spontanous Breathing  Post-op Pain: mild  Post-op Assessment: Post-op Vital signs reviewed, Patient's Cardiovascular Status Stable, Respiratory Function Stable, Patent Airway, No signs of Nausea or vomiting and Pain level controlled  Post-op Vital Signs: stable  Complications: No apparent anesthesia complications

## 2012-10-01 ENCOUNTER — Encounter (HOSPITAL_COMMUNITY): Payer: Self-pay | Admitting: Neurological Surgery

## 2012-10-01 MED ORDER — CYCLOBENZAPRINE HCL 10 MG PO TABS: 10.0000 mg | ORAL_TABLET | Freq: Three times a day (TID) | ORAL | Status: DC | PRN

## 2012-10-01 MED ORDER — OXYCODONE-ACETAMINOPHEN 5-325 MG PO TABS: 1.0000 | ORAL_TABLET | Freq: Four times a day (QID) | ORAL | Status: DC | PRN

## 2012-10-01 NOTE — Progress Notes (Signed)
UR COMPLETED  

## 2012-10-01 NOTE — Progress Notes (Signed)
Pt. discharged home accompanied by husband. Prescriptions and discharge instructions given with verbalization of understanding. Incision site on neck with no s/s of infection - no swelling, redness, bleeding, and/or drainage noted. Opportunity given to ask questions but no question asked. Pt. transported out of this unit in wheelchair by the volunteer  

## 2012-10-01 NOTE — Discharge Summary (Signed)
Physician Discharge Summary  Patient ID: Corey Tate MRN: [id number] DOB/AGE: 70-Sep-1942 70 y.o.  Admit date: 09/30/2012 Discharge date: 10/01/2012  Admission Diagnoses: C7-T1 subluxation with stenosis, neck and arm pain    Discharge Diagnoses: Same   Discharged Condition: good  Hospital Course: The patient was admitted on 09/30/2012 and taken to the operating room where the patient underwent posterior cervical C7-T1 decompression and fusion. The patient tolerated the procedure well and was taken to the recovery room and then to the floor in stable condition. The hospital course was routine. There were no complications. The wound remained clean dry and intact. Pt had appropriate neck soreness. No complaints of arm pain or new N/T/W. The patient remained afebrile with stable vital signs, and tolerated a regular diet. The patient continued to increase activities, and pain was well controlled with oral pain medications.   Consults: None  Significant Diagnostic Studies:  Results for orders placed during the hospital encounter of 09/22/12  SURGICAL PCR SCREEN      Result Value Range   MRSA, PCR NEGATIVE  NEGATIVE   Staphylococcus aureus NEGATIVE  NEGATIVE  BASIC METABOLIC PANEL      Result Value Range   Sodium 134 (*) 135 - 145 mEq/L   Potassium 4.3  3.5 - 5.1 mEq/L   Chloride 96  96 - 112 mEq/L   CO2 26  19 - 32 mEq/L   Glucose, Bld 105 (*) 70 - 99 mg/dL   BUN 15  6 - 23 mg/dL   Creatinine, Ser 4.09  0.50 - 1.35 mg/dL   Calcium 81.1  8.4 - 91.4 mg/dL   GFR calc non Af Amer 82 (*) >90 mL/min   GFR calc Af Amer >90  >90 mL/min  CBC WITH DIFFERENTIAL      Result Value Range   WBC 8.0  4.0 - 10.5 K/uL   RBC 3.93 (*) 4.22 - 5.81 MIL/uL   Hemoglobin 12.6 (*) 13.0 - 17.0 g/dL   HCT 78.2 (*) 95.6 - 21.3 %   MCV 91.9  78.0 - 100.0 fL   MCH 32.1  26.0 - 34.0 pg   MCHC 34.9  30.0 - 36.0 g/dL   RDW 08.6  57.8 - 46.9 %   Platelets 232  150 - 400 K/uL   Neutrophils Relative 58  43 - 77  %   Neutro Abs 4.6  1.7 - 7.7 K/uL   Lymphocytes Relative 27  12 - 46 %   Lymphs Abs 2.2  0.7 - 4.0 K/uL   Monocytes Relative 11  3 - 12 %   Monocytes Absolute 0.9  0.1 - 1.0 K/uL   Eosinophils Relative 4  0 - 5 %   Eosinophils Absolute 0.3  0.0 - 0.7 K/uL   Basophils Relative 1  0 - 1 %   Basophils Absolute 0.1  0.0 - 0.1 K/uL  PROTIME-INR      Result Value Range   Prothrombin Time 13.0  11.6 - 15.2 seconds   INR 0.99  0.00 - 1.49  TYPE AND SCREEN      Result Value Range   ABO/RH(D) O POS     Antibody Screen NEG     Sample Expiration 10/06/2012    ABO/RH      Result Value Range   ABO/RH(D) O POS      Dg Cervical Spine 2-3 Views  09/30/2012  *RADIOLOGY REPORT*  Clinical Data: Bilateral C8 radiculopathies.  DG C-ARM 1-60 MIN,CERVICAL SPINE - 2-3 VIEW  Comparison: CT myelogram 07/16/2012.  Findings: C-arm films document placement of lateral mass and pedicle screw construct at C7 and T1 in preparation for C7-T1 fusion.  IMPRESSION: As above.   Original Report Authenticated By: Davonna Belling, M.D.    Dg C-arm 1-60 Min  09/30/2012  *RADIOLOGY REPORT*  Clinical Data: Bilateral C8 radiculopathies.  DG C-ARM 1-60 MIN,CERVICAL SPINE - 2-3 VIEW  Comparison: CT myelogram 07/16/2012.  Findings: C-arm films document placement of lateral mass and pedicle screw construct at C7 and T1 in preparation for C7-T1 fusion.  IMPRESSION: As above.   Original Report Authenticated By: Davonna Belling, M.D.     Antibiotics:  Anti-infectives   Start     Dose/Rate Route Frequency Ordered Stop   09/30/12 1630  ceFAZolin (ANCEF) IVPB 1 g/50 mL premix     1 g 100 mL/hr over 30 Minutes Intravenous Every 8 hours 09/30/12 1619 10/01/12 0019   09/30/12 1212  bacitracin 50,000 Units in sodium chloride irrigation 0.9 % 500 mL irrigation  Status:  Discontinued       As needed 09/30/12 1212 09/30/12 1321   09/30/12 1100  bacitracin 44010 UNITS injection    Comments:  LOWERY, STEVEN: cabinet override      09/30/12 1100  09/30/12 2314   09/30/12 0600  ceFAZolin (ANCEF) IVPB 2 g/50 mL premix     2 g 100 mL/hr over 30 Minutes Intravenous On call to O.R. 09/29/12 1427 09/30/12 1132      Discharge Exam: Blood pressure 135/81, pulse 77, temperature 98.6 F (37 C), temperature source Oral, resp. rate 18, SpO2 87.00%. Neurologic: Grossly normal Incision clean dry and intact  Discharge Medications:     Medication List    STOP taking these medications       ibuprofen 200 MG tablet  Commonly known as:  ADVIL,MOTRIN      TAKE these medications       albuterol 108 (90 BASE) MCG/ACT inhaler  Commonly known as:  PROVENTIL HFA;VENTOLIN HFA  Inhale 2 puffs into the lungs every 6 (six) hours as needed for wheezing or shortness of breath.     amLODipine 5 MG tablet  Commonly known as:  NORVASC  Take 5 mg by mouth daily before breakfast.     cyclobenzaprine 10 MG tablet  Commonly known as:  FLEXERIL  Take 1 tablet (10 mg total) by mouth 3 (three) times daily as needed for muscle spasms.     fluticasone 50 MCG/ACT nasal spray  Commonly known as:  FLONASE  Place 2 sprays into the nose daily.     HYDROcodone-acetaminophen 5-325 MG per tablet  Commonly known as:  NORCO/VICODIN  Take 1 tablet by mouth daily as needed for pain.     lisinopril 40 MG tablet  Commonly known as:  PRINIVIL,ZESTRIL  Take 40 mg by mouth daily before breakfast.     oxyCODONE-acetaminophen 5-325 MG per tablet  Commonly known as:  PERCOCET/ROXICET  Take 1 tablet by mouth every 6 (six) hours as needed for pain.     VITAMIN B-1 PO  Take 1 tablet by mouth daily.     VITAMIN D-3 PO  Take 1 tablet by mouth daily.        Disposition: Home   Final Dx: Posterior cervical fusion C7-T1      Discharge Orders   Future Orders Complete By Expires     Call MD for:  difficulty breathing, headache or visual disturbances  As directed     Call MD for:  persistant nausea and vomiting  As directed     Call MD for:  redness,  tenderness, or signs of infection (pain, swelling, redness, odor or green/yellow discharge around incision site)  As directed     Call MD for:  severe uncontrolled pain  As directed     Call MD for:  temperature >100.4  As directed     Diet - low sodium heart healthy  As directed     Discharge instructions  As directed     Comments:      No driving or heavy lifting, strenuous activity    Increase activity slowly  As directed        Follow-up Information   Follow up with Lyan Holck S, MD. Schedule an appointment as soon as possible for a visit in 3 weeks.   Contact information:   [address] [place] [address] [phone]        Signed: Tia Alert 10/01/2012, 9:52 AM

## 2014-10-02 DIAGNOSIS — M48061 Spinal stenosis, lumbar region without neurogenic claudication: Secondary | ICD-10-CM

## 2014-10-02 DIAGNOSIS — G8929 Other chronic pain: Secondary | ICD-10-CM

## 2014-10-02 HISTORY — DX: Other chronic pain: G89.29

## 2014-10-02 HISTORY — DX: Spinal stenosis, lumbar region without neurogenic claudication: M48.061

## 2014-10-16 DIAGNOSIS — M4310 Spondylolisthesis, site unspecified: Secondary | ICD-10-CM | POA: Insufficient documentation

## 2014-10-16 HISTORY — DX: Spondylolisthesis, site unspecified: M43.10

## 2014-11-06 DIAGNOSIS — M47816 Spondylosis without myelopathy or radiculopathy, lumbar region: Secondary | ICD-10-CM

## 2014-11-06 HISTORY — DX: Spondylosis without myelopathy or radiculopathy, lumbar region: M47.816

## 2015-05-29 ENCOUNTER — Other Ambulatory Visit: Payer: Self-pay

## 2015-09-26 DIAGNOSIS — E785 Hyperlipidemia, unspecified: Secondary | ICD-10-CM

## 2015-09-26 DIAGNOSIS — F17200 Nicotine dependence, unspecified, uncomplicated: Secondary | ICD-10-CM

## 2015-09-26 DIAGNOSIS — I739 Peripheral vascular disease, unspecified: Secondary | ICD-10-CM | POA: Insufficient documentation

## 2015-09-26 HISTORY — DX: Nicotine dependence, unspecified, uncomplicated: F17.200

## 2015-09-26 HISTORY — DX: Hyperlipidemia, unspecified: E78.5

## 2016-01-31 DIAGNOSIS — I70203 Unspecified atherosclerosis of native arteries of extremities, bilateral legs: Secondary | ICD-10-CM | POA: Insufficient documentation

## 2016-01-31 DIAGNOSIS — I6523 Occlusion and stenosis of bilateral carotid arteries: Secondary | ICD-10-CM

## 2016-01-31 HISTORY — DX: Unspecified atherosclerosis of native arteries of extremities, bilateral legs: I70.203

## 2016-01-31 HISTORY — DX: Occlusion and stenosis of bilateral carotid arteries: I65.23

## 2016-03-04 DIAGNOSIS — R05 Cough: Secondary | ICD-10-CM | POA: Insufficient documentation

## 2016-03-04 DIAGNOSIS — R059 Cough, unspecified: Secondary | ICD-10-CM

## 2016-03-04 DIAGNOSIS — F17210 Nicotine dependence, cigarettes, uncomplicated: Secondary | ICD-10-CM

## 2016-03-04 DIAGNOSIS — J449 Chronic obstructive pulmonary disease, unspecified: Secondary | ICD-10-CM | POA: Insufficient documentation

## 2016-03-04 DIAGNOSIS — G4734 Idiopathic sleep related nonobstructive alveolar hypoventilation: Secondary | ICD-10-CM

## 2016-03-04 HISTORY — DX: Nicotine dependence, cigarettes, uncomplicated: F17.210

## 2016-03-04 HISTORY — DX: Chronic obstructive pulmonary disease, unspecified: J44.9

## 2016-03-04 HISTORY — DX: Idiopathic sleep related nonobstructive alveolar hypoventilation: G47.34

## 2016-03-04 HISTORY — DX: Cough: R05

## 2016-03-04 HISTORY — DX: Cough, unspecified: R05.9

## 2016-08-04 DIAGNOSIS — R55 Syncope and collapse: Secondary | ICD-10-CM

## 2016-08-04 HISTORY — DX: Syncope and collapse: R55

## 2016-12-10 DIAGNOSIS — I480 Paroxysmal atrial fibrillation: Secondary | ICD-10-CM

## 2016-12-10 DIAGNOSIS — I4892 Unspecified atrial flutter: Secondary | ICD-10-CM

## 2016-12-10 DIAGNOSIS — Z8679 Personal history of other diseases of the circulatory system: Secondary | ICD-10-CM

## 2016-12-10 HISTORY — DX: Personal history of other diseases of the circulatory system: Z86.79

## 2016-12-10 HISTORY — DX: Paroxysmal atrial fibrillation: I48.0

## 2016-12-10 HISTORY — DX: Unspecified atrial flutter: I48.92

## 2017-06-29 DIAGNOSIS — I4821 Permanent atrial fibrillation: Secondary | ICD-10-CM

## 2017-06-29 DIAGNOSIS — I480 Paroxysmal atrial fibrillation: Secondary | ICD-10-CM | POA: Insufficient documentation

## 2017-06-29 DIAGNOSIS — I482 Chronic atrial fibrillation, unspecified: Secondary | ICD-10-CM | POA: Insufficient documentation

## 2017-06-29 HISTORY — DX: Permanent atrial fibrillation: I48.21

## 2017-12-11 DIAGNOSIS — M1990 Unspecified osteoarthritis, unspecified site: Secondary | ICD-10-CM | POA: Insufficient documentation

## 2017-12-11 DIAGNOSIS — F419 Anxiety disorder, unspecified: Secondary | ICD-10-CM

## 2017-12-11 DIAGNOSIS — Z8619 Personal history of other infectious and parasitic diseases: Secondary | ICD-10-CM

## 2017-12-11 DIAGNOSIS — R0989 Other specified symptoms and signs involving the circulatory and respiratory systems: Secondary | ICD-10-CM

## 2017-12-11 DIAGNOSIS — I129 Hypertensive chronic kidney disease with stage 1 through stage 4 chronic kidney disease, or unspecified chronic kidney disease: Secondary | ICD-10-CM

## 2017-12-11 HISTORY — DX: Other specified symptoms and signs involving the circulatory and respiratory systems: R09.89

## 2017-12-11 HISTORY — DX: Hypertensive chronic kidney disease with stage 1 through stage 4 chronic kidney disease, or unspecified chronic kidney disease: I12.9

## 2017-12-11 HISTORY — DX: Anxiety disorder, unspecified: F41.9

## 2017-12-11 HISTORY — DX: Personal history of other infectious and parasitic diseases: Z86.19

## 2017-12-31 ENCOUNTER — Ambulatory Visit (INDEPENDENT_AMBULATORY_CARE_PROVIDER_SITE_OTHER): Payer: Medicare Other | Admitting: Cardiology

## 2017-12-31 ENCOUNTER — Encounter: Payer: Self-pay | Admitting: Cardiology

## 2017-12-31 VITALS — BP 130/68 | HR 59 | Ht 71.0 in | Wt 169.4 lb

## 2017-12-31 DIAGNOSIS — J449 Chronic obstructive pulmonary disease, unspecified: Secondary | ICD-10-CM

## 2017-12-31 DIAGNOSIS — I482 Chronic atrial fibrillation, unspecified: Secondary | ICD-10-CM

## 2017-12-31 DIAGNOSIS — I129 Hypertensive chronic kidney disease with stage 1 through stage 4 chronic kidney disease, or unspecified chronic kidney disease: Secondary | ICD-10-CM

## 2017-12-31 DIAGNOSIS — I6503 Occlusion and stenosis of bilateral vertebral arteries: Secondary | ICD-10-CM

## 2017-12-31 DIAGNOSIS — N182 Chronic kidney disease, stage 2 (mild): Secondary | ICD-10-CM

## 2017-12-31 DIAGNOSIS — Z7901 Long term (current) use of anticoagulants: Secondary | ICD-10-CM | POA: Diagnosis not present

## 2017-12-31 DIAGNOSIS — R296 Repeated falls: Secondary | ICD-10-CM

## 2017-12-31 DIAGNOSIS — I6523 Occlusion and stenosis of bilateral carotid arteries: Secondary | ICD-10-CM

## 2017-12-31 HISTORY — DX: Long term (current) use of anticoagulants: Z79.01

## 2017-12-31 MED ORDER — DILTIAZEM HCL ER COATED BEADS 360 MG PO CP24: 360.0000 mg | ORAL_CAPSULE | ORAL | 3 refills | Status: DC

## 2017-12-31 NOTE — Patient Instructions (Addendum)
Medication Instructions:  Your physician has recommended you make the following change in your medication:  CHANGE diltiazem to 360 mg every other day  Labwork: None  Testing/Procedures: Your physician has recommended that you wear a holter monitor. Holter monitors are medical devices that record the heart's electrical activity. Doctors most often use these monitors to diagnose arrhythmias. Arrhythmias are problems with the speed or rhythm of the heartbeat. The monitor is a small, portable device. You can wear one while you do your normal daily activities. This is usually used to diagnose what is causing palpitations/syncope (passing out).  Follow-Up: Your physician recommends that you schedule a follow-up appointment in: 6 months  Any Other Special Instructions Will Be Listed Below (If Applicable).  AMB referral to vascular surgery, they will call you within 1 week to schedule an appointment  If you need a refill on your cardiac medications before your next appointment, please call your pharmacy.   [place], RN, BSN

## 2017-12-31 NOTE — Progress Notes (Signed)
Cardiology Office Note:    Date:  12/31/2017   ID:  Corey Tate, DOB [birth year], MRN [id number]  PCP:  Corey Riches, NP  Cardiologist:  Corey More, MD    Referring MD: Corey Riches, NP    ASSESSMENT:    1. Chronic atrial fibrillation ([place])   2. Chronic anticoagulation   3. 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 COPD, severe ([place])   5. Bilateral carotid artery stenosis   6. Vertebral artery narrowing, bilateral   7. Multiple falls    PLAN:    In order of problems listed above:  1. With concern regarding bradycardia and falls decrease his calcium channel blocker 50% Holter monitor in 2 weeks 2. No longer anticoagulated with falls and trauma concern 3. Stable blood pressure he has no orthostatic shift continue current treatment including calcium channel blocker ACE inhibitor 4. Managed by his PCP 5. Referral vascular surgery I be interested in opinion if his episodes of falling related to posterior circulation bilateral vertebral artery stenosis 6. See above 7. No longer anticoagulated   Next appointment: 6 months   Medication Adjustments/Labs and Tests Ordered: Current medicines are reviewed at length with the patient today.  Concerns regarding medicines are outlined above.  Orders Placed This Encounter  Procedures  . Ambulatory referral to Vascular Surgery  . Holter monitor - 48 hour  . EKG 12-Lead   Meds ordered this encounter  Medications  . diltiazem (CARDIZEM CD) 360 MG 24 hr capsule    Sig: Take 1 capsule (360 mg total) by mouth every other day.    Dispense:  30 capsule    Refill:  3    Chief Complaint  Patient presents with  . Atrial Fibrillation    History of Present Illness:    Corey Tate is a 77 y.o. male with a hx of chronic atrial fibrillation severe COPD hypertension hyperlipidemia and peripheral vascular disease last seen by by Dr Agustin Cree at [facility] cardiology 01/28/16.  Echocardiogram performed in 2018 is  remarked upon in the epic care everywhere showing mild LVH normal left ventricular systolic function and ejection fraction. His vascular surgery records reveal that he has 56 to 79% bilateral carotid artery stenosis CTA performed 02/13/2017 showed diffuse atherosclerosis at the aortic arch involving both common carotid arteries and at the bifurcation without significant stenosis in the Tate distal vessels high-grade bilateral vertebral artery stenosis with the right vertebral artery being dominant. Compliance with diet, lifestyle and medications: Yes  I reviewed the history with the patient his wife he is not anticoagulated because of frequent falls and the risk of trauma.  Overall he is done well no chest pain dyspnea palpitation or syncope.  His atrial fibrillation is relatively slow we will decrease his calcium channel blocker by 50% undergo Holter monitor in 2 weeks to assess for significant bradycardia.  He has no change in orthostatic blood pressure in the office and is not having typical posterior circulation TIA.  He was previously followed by vascular surgery and [place] and request referral to Dr. Judge Stall Past Medical History:  Diagnosis Date  . Anxiety 12/11/2017  . Arthritis   . Asthma    ?  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 Atrial flutter by electrocardiogram ([place]) 12/10/2016  . Bilateral carotid artery stenosis 01/31/2016  . Cancer ([place])    SKIN CANCERS  . Carotid bruit 12/11/2017  . COPD, severe ([place])  03/04/2016  . Cough 03/04/2016  . Degenerative arthritis of hip 04/02/2012  . Dyslipidemia 09/26/2015  . Dysrhythmia    "SKIPS"  . GERD (gastroesophageal reflux disease)   . H/O varicella 12/11/2017  . History of atrial fibrillation 12/10/2016  . Hypertension   . Nicotine dependence, uncomplicated 11/30/6787  . Nocturnal hypoxemia 03/04/2016  . Peripheral vascular disease ([place])   . Permanent atrial fibrillation ([place]) 06/29/2017  .  Shortness of breath    USES OXYGEN AT NIGHT--HX OF RIGHT LOWER LOBE PULMONARY NODULE--FOLLOWED BY PT'S MEDICAL DOCTOR AND HAS HAD FOR YEARS  . Smoking greater than 40 pack years 03/04/2016  . Syncope 08/04/2016    Past Surgical History:  Procedure Laterality Date  . BACK SURGERY     LOWER BACK SURGERY X 3 - FUSION  . CARPAL TUNNEL RELEASE AND SURGERY LEFT ELBOW    . CATARACT EXTRACTION    . CERVICAL DISC SURGERY     FUSION - ONLY SLIGHT LIMITATION IN NECK MOVEMENT  . POSTERIOR CERVICAL FUSION/FORAMINOTOMY Bilateral 09/30/2012   Procedure: CERVICAL SEVEN AND THORACIC ONE BILATERAL POSTERIOR CERVICAL FUSION/FORAMINOTOMY ;  Surgeon: Eustace Moore, MD;  Location: [facility];  Service: Neurosurgery;  Laterality: Bilateral;  . RIGHT SHOULDER SURGERY    . SINUS SURGERY WITH INSTATRAK    . TOTAL HIP ARTHROPLASTY  04/02/2012   Procedure: TOTAL HIP ARTHROPLASTY ANTERIOR APPROACH;  Surgeon: Mcarthur Rossetti, MD;  Location: [facility];  Service: Orthopedics;  Laterality: Right;  Right Total Hip Arthroplasty  . VASCULAR SURGERY     STENT PLACEMENT RIGHT LEG AND "ROTOR ROOTER" LEFT LEG    Current Medications: Current Meds  Medication Sig  . amLODipine (NORVASC) 5 MG tablet Take 5 mg by mouth daily before breakfast.  . ANORO ELLIPTA 62.5-25 MCG/INH AEPB Inhale 1 Pump into the lungs daily.  [name] aspirin EC 81 MG tablet Take 1 tablet by mouth daily.  . Cholecalciferol (VITAMIN D-3 PO) Take 1 tablet by mouth daily.  . cyclobenzaprine (FLEXERIL) 10 MG tablet Take 1 tablet (10 mg total) by mouth 3 (three) times daily as needed for muscle spasms.  [name] diltiazem (CARDIZEM CD) 360 MG 24 hr capsule Take 1 capsule (360 mg total) by mouth every other day.  . fluticasone (FLONASE) 50 MCG/ACT nasal spray Place 2 sprays into the nose daily.  [name] lisinopril (PRINIVIL,ZESTRIL) 40 MG tablet Take 40 mg by mouth daily before breakfast.  . pravastatin (PRAVACHOL) 20 MG tablet Take 1 tablet by mouth daily.  . Thiamine HCl  (VITAMIN B-1 PO) Take 1 tablet by mouth daily.  . [DISCONTINUED] diltiazem (CARDIZEM CD) 360 MG 24 hr capsule Take 1 capsule by mouth daily.     Allergies:   Ferra-caps [iron] and Streptomycin   Social History   Socioeconomic History  . Marital status: Married    Spouse name: Not on file  . Number of children: Not on file  . Years of education: Not on file  . Highest education level: Not on file  Occupational History  . Not on file  Social Needs  . Financial resource strain: Not on file  . Food insecurity:    Worry: Not on file    Inability: Not on file  . Transportation needs:    Medical: Not on file    Non-medical: Not on file  Tobacco Use  . Smoking status: Current Some Day Smoker    Packs/day: 1.00    Years: 55.00    Pack years: 55.00  Types: Cigarettes  . Smokeless tobacco: Never Used  Substance and Sexual Activity  . Alcohol use: Yes    Alcohol/week: 14.4 oz    Types: 24 Cans of beer per week    Comment: 6 BEERS A DAY  . Drug use: No  . Sexual activity: Not on file  Lifestyle  . Physical activity:    Days per week: Not on file    Minutes per session: Not on file  . Stress: Not on file  Relationships  . Social connections:    Talks on phone: Not on file    Gets together: Not on file    Attends religious service: Not on file    Active member of club or organization: Not on file    Attends meetings of clubs or organizations: Not on file    Relationship status: Not on file  Other Topics Concern  . Not on file  Social History Narrative  . Not on file     Family History: The patient's family history includes Cancer in his father; Diabetes in his brother and sister. ROS:   Please see the history of present illness.    All other systems reviewed and are negative.  EKGs/Labs/Other Studies Reviewed:    The following studies were reviewed today:  EKG:  EKG ordered today.  The ekg ordered today demonstrates AF with a controlled rate co ASMI  BMP  02/09/2017 creatinine 1.36 GFR 51 cc there is no lipid profile available in the chart Recent Labs:No results found for requested labs within last 8760 hours. Recent Lipid Panel No results found for: CHOL, TRIG, HDL, CHOLHDL, VLDL, LDLCALC, LDLDIRECT  Physical Exam:    VS:  BP 130/68 (BP Location: Right Arm, Patient Position: Sitting, Cuff Size: Normal)   Pulse (!) 59   Ht 5\' 11"  (1.803 m)   Wt 169 lb 6.4 oz (76.8 kg)   SpO2 96%   BMI 23.63 kg/m     Wt Readings from Last 3 Encounters:  12/31/17 169 lb 6.4 oz (76.8 kg)  09/22/12 157 lb (71.2 kg)  04/02/12 159 lb (72.1 kg)     GEN:  Well nourished, well developed in no acute distress HEENT: Normal NECK: No JVD; No carotid bruits LYMPHATICS: No lymphadenopathy CARDIAC: Irregular irregular variable first heart sound  RESPIRATORY:  Clear to auscultation without rales, wheezing or rhonchi  ABDOMEN: Soft, non-tender, non-distended MUSCULOSKELETAL:  No edema; No deformity  SKIN: Warm and dry NEUROLOGIC:  Alert and oriented x 3 PSYCHIATRIC:  Normal affect    Signed, Corey More, MD  12/31/2017 5:37 PM    [place]

## 2018-01-01 ENCOUNTER — Other Ambulatory Visit: Payer: Self-pay

## 2018-01-01 DIAGNOSIS — I6523 Occlusion and stenosis of bilateral carotid arteries: Secondary | ICD-10-CM

## 2018-01-22 ENCOUNTER — Ambulatory Visit: Payer: Medicare Other

## 2018-01-22 DIAGNOSIS — I482 Chronic atrial fibrillation, unspecified: Secondary | ICD-10-CM

## 2018-02-09 ENCOUNTER — Ambulatory Visit (INDEPENDENT_AMBULATORY_CARE_PROVIDER_SITE_OTHER): Payer: Medicare Other | Admitting: Vascular Surgery

## 2018-02-09 ENCOUNTER — Ambulatory Visit (HOSPITAL_COMMUNITY)
Admission: RE | Admit: 2018-02-09 | Discharge: 2018-02-09 | Disposition: A | Discharge status: Home / Self Care | Payer: Medicare Other | Source: Ambulatory Visit | Attending: Vascular Surgery | Admitting: Vascular Surgery

## 2018-02-09 ENCOUNTER — Encounter (INDEPENDENT_AMBULATORY_CARE_PROVIDER_SITE_OTHER): Payer: Self-pay

## 2018-02-09 ENCOUNTER — Other Ambulatory Visit: Payer: Self-pay

## 2018-02-09 ENCOUNTER — Encounter: Payer: Self-pay | Admitting: Vascular Surgery

## 2018-02-09 VITALS — BP 183/91 | HR 76 | Temp 97.2°F | Resp 20 | Ht 71.0 in | Wt 165.0 lb

## 2018-02-09 DIAGNOSIS — I6523 Occlusion and stenosis of bilateral carotid arteries: Secondary | ICD-10-CM

## 2018-02-09 NOTE — Progress Notes (Signed)
[facility] [place]  Patient name: Corey Tate MRN: [id number] DOB: April 08, 1941 Sex: male  REASON FOR CONSULT: Follow-up of carotid stenosis, asymptomatic  HPI: Corey Tate is a 77 y.o. male, who is here today for follow-up.  He had been followed in [facility] and is here today for evaluation of carotid disease.  He denies any prior history of stroke, amaurosis fugax or a aphasia.  He is here today with his daughter.  He does have a history of bilateral lower extremity claudication in the past and was treated with SFA interventions in [facility] as well.  He reports this was over 5 years ago and has had very good relief of his symptoms with this.  Past Medical History:  Diagnosis Date  . Anxiety 12/11/2017  . Arthritis   . Asthma    ?  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 Atrial flutter by electrocardiogram ([place]) 12/10/2016  . Bilateral carotid artery stenosis 01/31/2016  . Cancer ([place])    SKIN CANCERS  . Carotid bruit 12/11/2017  . COPD, severe ([place]) 03/04/2016  . Cough 03/04/2016  . Degenerative arthritis of hip 04/02/2012  . Dyslipidemia 09/26/2015  . Dysrhythmia    "SKIPS"  . GERD (gastroesophageal reflux disease)   . H/O varicella 12/11/2017  . History of atrial fibrillation 12/10/2016  . Hypertension   . Nicotine dependence, uncomplicated 7/0/2637  . Nocturnal hypoxemia 03/04/2016  . Peripheral vascular disease ([place])   . Permanent atrial fibrillation ([place]) 06/29/2017  . Shortness of breath    USES OXYGEN AT NIGHT--HX OF RIGHT LOWER LOBE PULMONARY NODULE--FOLLOWED BY PT'S MEDICAL DOCTOR AND HAS HAD FOR YEARS  . Smoking greater than 40 pack years 03/04/2016  . Syncope 08/04/2016    Family History  Problem Relation Age of Onset  . Cancer Father   . Diabetes Sister   . Diabetes Brother     SOCIAL HISTORY: Social History   Socioeconomic History    . Marital status: Married    Spouse name: Not on file  . Number of children: Not on file  . Years of education: Not on file  . Highest education level: Not on file  Occupational History  . Not on file  Social Needs  . Financial resource strain: Not on file  . Food insecurity:    Worry: Not on file    Inability: Not on file  . Transportation needs:    Medical: Not on file    Non-medical: Not on file  Tobacco Use  . Smoking status: Current Some Day Smoker    Packs/day: 1.00    Years: 55.00    Pack years: 55.00    Types: Cigarettes  . Smokeless tobacco: Never Used  Substance and Sexual Activity  . Alcohol use: Yes    Alcohol/week: 24.0 standard drinks    Types: 24 Cans of beer per week    Comment: 6 BEERS A DAY  . Drug use: No  . Sexual activity: Not on file  Lifestyle  . Physical activity:    Days per week: Not on file    Minutes per session: Not on file  . Stress: Not on file  Relationships  . Social connections:    Talks on phone: Not on file    Gets together: Not on file    Attends religious service: Not on file    Active member of club or organization: Not on  file    Attends meetings of clubs or organizations: Not on file    Relationship status: Not on file  . Intimate partner violence:    Fear of current or ex partner: Not on file    Emotionally abused: Not on file    Physically abused: Not on file    Forced sexual activity: Not on file  Other Topics Concern  . Not on file  Social History Narrative  . Not on file    Allergies  Allergen Reactions  . Ferra-Caps [Iron] Hives  . Streptomycin Hives and Itching    Current Outpatient Medications  Medication Sig Dispense Refill  . albuterol (PROVENTIL HFA;VENTOLIN HFA) 108 (90 Base) MCG/ACT inhaler Inhale into the lungs every 6 (six) hours as needed for wheezing or shortness of breath.    [name] amLODipine (NORVASC) 5 MG tablet Take 5 mg by mouth daily before breakfast.    . ANORO ELLIPTA 62.5-25 MCG/INH AEPB  Inhale 1 Pump into the lungs daily.  0  . aspirin EC 81 MG tablet Take 1 tablet by mouth daily.    . Cholecalciferol (VITAMIN D-3 PO) Take 1 tablet by mouth daily.    . cyclobenzaprine (FLEXERIL) 10 MG tablet Take 1 tablet (10 mg total) by mouth 3 (three) times daily as needed for muscle spasms. 60 tablet 1  . diltiazem (CARDIZEM CD) 360 MG 24 hr capsule Take 1 capsule (360 mg total) by mouth every other day. 30 capsule 3  . fluticasone (FLONASE) 50 MCG/ACT nasal spray Place 2 sprays into the nose daily.    [name] ibuprofen (ADVIL,MOTRIN) 100 MG tablet Take 100 mg by mouth every 6 (six) hours as needed for fever.    [name] lisinopril (PRINIVIL,ZESTRIL) 40 MG tablet Take 40 mg by mouth daily before breakfast.    . Menaquinone-7 (VITAMIN K2 PO) Take by mouth.    . pravastatin (PRAVACHOL) 20 MG tablet Take 1 tablet by mouth daily.    . Thiamine HCl (VITAMIN B-1 PO) Take 1 tablet by mouth daily.     No current facility-administered medications for this visit.     REVIEW OF SYSTEMS:  [X]  denotes positive finding, [ ]  denotes negative finding Cardiac  Comments:  Chest pain or chest pressure:    Shortness of breath upon exertion: x   Short of breath when lying flat:    Irregular heart rhythm: x       Vascular    Pain in calf, thigh, or hip brought on by ambulation: x   Pain in feet at night that wakes you up from your sleep:     Blood clot in your veins:    Leg swelling:         Pulmonary    Oxygen at home:    Productive cough:     Wheezing:         Neurologic    Sudden weakness in arms or legs:     Sudden numbness in arms or legs:     Sudden onset of difficulty speaking or slurred speech:    Temporary loss of vision in one eye:     Problems with dizziness:         Gastrointestinal    Blood in stool:     Vomited blood:         Genitourinary    Burning when urinating:     Blood in urine:        Psychiatric    Major depression:  Hematologic    Bleeding problems:    Problems  with blood clotting too easily:        Skin    Rashes or ulcers:        Constitutional    Fever or chills:      PHYSICAL EXAM: Vitals:   02/09/18 1212 02/09/18 1214  BP: (!) 169/88 (!) 183/91  Pulse: 76   Resp: 20   Temp: (!) 97.2 F (36.2 C)   TempSrc: Oral   SpO2: 96%   Weight: 165 lb (74.8 kg)   Height: 5\' 11"  (1.803 m)     GENERAL: The patient is a well-nourished male, in no acute distress. The vital signs are documented above. CARDIOVASCULAR: Carotid arteries without bruits bilaterally.  He has 2+ radial pulses bilaterally.  He does have palpable 1-2+ dorsalis pedis pulses bilaterally PULMONARY: There is good air exchange  ABDOMEN: Soft and non-tender  MUSCULOSKELETAL: There are no major deformities or cyanosis. NEUROLOGIC: No focal weakness or paresthesias are detected. SKIN: There are no ulcers or rashes noted. PSYCHIATRIC: The patient has a normal affect.  DATA:  Carotid duplex today suggests a calcified plaque and some narrowing in his distal common carotid artery and no significant narrowing in his right internal carotid artery.  On the left he has 40 to 59% stenosis  MEDICAL ISSUES: In reviewing his old records from [facility], he had had elevated velocities by Doppler and a CT angiogram suggesting no significant stenosis.  I discussed this with the patient and his daughter.  Do not feel that he is shown any evidence of progression.  Would recommend yearly carotid duplex for continued follow-up.  I would not recommend any ongoing noninvasive studies of his lower extremities.  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  Will let us know if he develops any recurrent claudication type symptoms.   Rosetta Posner, MD FACS [facility] Tel [phone] Pager [phone]

## 2018-08-23 NOTE — Progress Notes (Signed)
Cardiology Office Note:    Date:  08/24/2018   ID:  Corey Tate, Corey Tate Corey Tate, MRN [id number]  PCP:  Corey Riches, NP  Cardiologist:  Corey More, MD    Referring MD: Corey Riches, NP    ASSESSMENT:    1. Syncope, unspecified syncope type   2. Chronic atrial fibrillation   3. 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 Dyslipidemia   5. COPD, severe ([place])    PLAN:    In order of problems listed above:  1. He had another abrupt syncopal episode fortunately was not injured.  Previously when it occurred he had slow atrial fibrillation I think what we should do is discontinue his calcium channel blocker would wait a week and put a 7-day monitor [facility] quires a pacemaker.  Any recurrence of suppressant therapy may need implanted loop recorder regarding tachybradycardia syndrome he is not anticoagulated because of falls and trauma. 2. His paroxysmal atrial fibrillation is in sinus rhythm discontinue calcium channel blocker check event monitor 1 week later. 3. Stable blood pressure address whether he requires another agent at the next visit off calcium channel blocker 4. Continue with stati with carotid disease check CMP and lipid profile 5. Remains short of breath with Tate than usual activity managed by pulmonary   Next appointment: 6 weeks   Medication Adjustments/Labs and Tests Ordered: Current medicines are reviewed at length with the patient today.  Concerns regarding medicines are outlined above.  No orders of the defined types were placed in this encounter.  No orders of the defined types were placed in this encounter.   Chief Complaint  Patient presents with  . Atrial Fibrillation  . Loss of Consciousness    History of Present Illness:    Corey Tate is a 78 y.o. male with a hx of  chronic atrial fibrillation, severe COPD, hypertension, hyperlipidemia and peripheral vascular disease  last seen 12/31/17. Compliance with diet,  lifestyle and medications: Yes  Had a recent abrupt syncopal episode in the kitchen was not injured.  He has had previous slow rates with atrial fibrillation his calcium channel blocker is decreased will discontinue and monitor him in a week as he is at risk to require pacemaker.  He has chronic exertional shortness of breath still Tate than minor activities related to COPD no edema orthopnea chest pain or TIA.  He is not anticoagulated because of falls and bleeding risk Past Medical History:  Diagnosis Date  . Anxiety 12/11/2017  . Arthritis   . Asthma    ?  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 Atrial flutter by electrocardiogram ([place]) 12/10/2016  . Bilateral carotid artery stenosis 01/31/2016  . Cancer ([place])    SKIN CANCERS  . Carotid bruit 12/11/2017  . COPD, severe ([place]) 03/04/2016  . Cough 03/04/2016  . Degenerative arthritis of hip 04/02/2012  . Dyslipidemia 09/26/2015  . Dysrhythmia    "SKIPS"  . GERD (gastroesophageal reflux disease)   . H/O varicella 12/11/2017  . History of atrial fibrillation 12/10/2016  . Hypertension   . Nicotine dependence, uncomplicated 07/28/2351  . Nocturnal hypoxemia 03/04/2016  . Peripheral vascular disease ([place])   . Permanent atrial fibrillation 06/29/2017  . Shortness of breath    USES OXYGEN AT NIGHT--HX OF RIGHT LOWER LOBE PULMONARY NODULE--FOLLOWED BY PT'S MEDICAL DOCTOR AND HAS HAD FOR YEARS  . Smoking greater than 40 pack years 03/04/2016  . Syncope  08/04/2016    Past Surgical History:  Procedure Laterality Date  . BACK SURGERY     LOWER BACK SURGERY X 3 - FUSION  . CARPAL TUNNEL RELEASE AND SURGERY LEFT ELBOW    . CATARACT EXTRACTION    . CERVICAL DISC SURGERY     FUSION - ONLY SLIGHT LIMITATION IN NECK MOVEMENT  . POSTERIOR CERVICAL FUSION/FORAMINOTOMY Bilateral 09/30/2012   Procedure: CERVICAL SEVEN AND THORACIC ONE BILATERAL POSTERIOR CERVICAL FUSION/FORAMINOTOMY ;  Surgeon: Eustace Moore, MD;   Location: [place] NEURO ORS;  Service: Neurosurgery;  Laterality: Bilateral;  . RIGHT SHOULDER SURGERY    . SINUS SURGERY WITH INSTATRAK    . TOTAL HIP ARTHROPLASTY  04/02/2012   Procedure: TOTAL HIP ARTHROPLASTY ANTERIOR APPROACH;  Surgeon: Mcarthur Rossetti, MD;  Location: [facility] ORS;  Service: Orthopedics;  Laterality: Right;  Right Total Hip Arthroplasty  . VASCULAR SURGERY     STENT PLACEMENT RIGHT LEG AND "ROTOR ROOTER" LEFT LEG    Current Medications: Current Meds  Medication Sig  . albuterol (PROVENTIL HFA;VENTOLIN HFA) 108 (90 Base) MCG/ACT inhaler Inhale into the lungs every 6 (six) hours as needed for wheezing or shortness of breath.  [name] amLODipine (NORVASC) 5 MG tablet Take 5 mg by mouth daily before breakfast.  . ANORO ELLIPTA 62.5-25 MCG/INH AEPB Inhale 1 Pump into the lungs daily.  [name] aspirin EC 81 MG tablet Take 1 tablet by mouth daily.  . Cholecalciferol (VITAMIN D-3 PO) Take 1 tablet by mouth daily.  [name] diltiazem (CARDIZEM CD) 360 MG 24 hr capsule Take 1 capsule (360 mg total) by mouth every other day.  . docusate sodium (COLACE) 100 MG capsule Take 100 mg by mouth daily.  . famotidine (PEPCID) 20 MG tablet Take 20 mg by mouth daily.  . fluticasone (FLONASE) 50 MCG/ACT nasal spray Place 2 sprays into the nose daily.  [name] ibuprofen (ADVIL,MOTRIN) 100 MG tablet Take 100 mg by mouth every 6 (six) hours as needed for fever.  . Menaquinone-7 (VITAMIN K2 PO) Take 1 tablet by mouth daily.   . Omega-3 Fatty Acids (FISH OIL) 1000 MG CAPS Take 1 capsule by mouth daily.  . pravastatin (PRAVACHOL) 40 MG tablet Take 20 mg by mouth daily.   . Thiamine HCl (VITAMIN B-1 PO) Take 1 tablet by mouth daily.  . vitamin B-12 (CYANOCOBALAMIN) 100 MCG tablet Take 100 mcg by mouth daily.  . vitamin C (ASCORBIC ACID) 500 MG tablet Take 500 mg by mouth daily.     Allergies:   Ferra-caps [iron] and Streptomycin   Social History   Socioeconomic History  . Marital status: Married    Spouse name: Not  on file  . Number of children: Not on file  . Years of education: Not on file  . Highest education level: Not on file  Occupational History  . Not on file  Social Needs  . Financial resource strain: Not on file  . Food insecurity:    Worry: Not on file    Inability: Not on file  . Transportation needs:    Medical: Not on file    Non-medical: Not on file  Tobacco Use  . Smoking status: Current Some Day Smoker    Packs/day: 1.00    Years: 55.00    Pack years: 55.00    Types: Cigarettes  . Smokeless tobacco: Former Systems developer    Types: Chew  Substance and Sexual Activity  . Alcohol use: Yes    Alcohol/week: 24.0 standard drinks  Types: 24 Cans of beer per week    Comment: 6 BEERS A DAY  . Drug use: No  . Sexual activity: Not on file  Lifestyle  . Physical activity:    Days per week: Not on file    Minutes per session: Not on file  . Stress: Not on file  Relationships  . Social connections:    Talks on phone: Not on file    Gets together: Not on file    Attends religious service: Not on file    Active member of club or organization: Not on file    Attends meetings of clubs or organizations: Not on file    Relationship status: Not on file  Other Topics Concern  . Not on file  Social History Narrative  . Not on file     Family History: The patient's family history includes Cancer in his father; Diabetes in his brother and sister. ROS:   Please see the history of present illness.    All other systems reviewed and are negative.  EKGs/Labs/Other Studies Reviewed:    The following studies were reviewed today:  EKG:  EKG ordered today and personally reviewed.  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  Recent Lipid Panel No results found for: CHOL, TRIG, HDL, CHOLHDL, VLDL, LDLCALC, LDLDIRECT  Physical Exam:    VS:  BP (!) 152/80 (BP Location: Right Arm, Patient  Position: Sitting, Cuff Size: Normal)   Pulse 72   Ht 5\' 11"  (1.803 m)   Wt 173 lb (78.5 kg)   SpO2 95%   BMI 24.13 kg/m     Wt Readings from Last 3 Encounters:  08/24/18 173 lb (78.5 kg)  02/09/18 165 lb (74.8 kg)  12/31/17 169 lb 6.4 oz (76.8 kg)     GEN:  Well nourished, well developed in no acute distress HEENT: Normal NECK: No JVD; No carotid bruits LYMPHATICS: No lymphadenopathy CARDIAC: Irregular rhythm variable first heart sound no murmurs, rubs, gallops RESPIRATORY:  Clear to auscultation without rales, wheezing or rhonchi  ABDOMEN: Soft, non-tender, non-distended MUSCULOSKELETAL:  No edema; No deformity  SKIN: Warm and dry NEUROLOGIC:  Alert and oriented x 3 PSYCHIATRIC:  Normal affect    Signed, Corey More, MD  08/24/2018 2:45 PM    [facility]

## 2018-08-24 ENCOUNTER — Encounter: Payer: Self-pay | Admitting: Cardiology

## 2018-08-24 ENCOUNTER — Ambulatory Visit (INDEPENDENT_AMBULATORY_CARE_PROVIDER_SITE_OTHER): Payer: Medicare Other | Admitting: Cardiology

## 2018-08-24 VITALS — BP 152/80 | HR 72 | Ht 71.0 in | Wt 173.0 lb

## 2018-08-24 DIAGNOSIS — I482 Chronic atrial fibrillation, unspecified: Secondary | ICD-10-CM

## 2018-08-24 DIAGNOSIS — E785 Hyperlipidemia, unspecified: Secondary | ICD-10-CM | POA: Diagnosis not present

## 2018-08-24 DIAGNOSIS — I129 Hypertensive chronic kidney disease with stage 1 through stage 4 chronic kidney disease, or unspecified chronic kidney disease: Secondary | ICD-10-CM

## 2018-08-24 DIAGNOSIS — J449 Chronic obstructive pulmonary disease, unspecified: Secondary | ICD-10-CM

## 2018-08-24 DIAGNOSIS — I480 Paroxysmal atrial fibrillation: Secondary | ICD-10-CM

## 2018-08-24 DIAGNOSIS — N182 Chronic kidney disease, stage 2 (mild): Secondary | ICD-10-CM

## 2018-08-24 DIAGNOSIS — R55 Syncope and collapse: Secondary | ICD-10-CM | POA: Diagnosis not present

## 2018-08-24 NOTE — Patient Instructions (Addendum)
Medication Instructions:  Your physician has recommended you make the following change in your medication:   STOP diltiazem (cardizem CD)   If you need a refill on your cardiac medications before your next appointment, please call your pharmacy.   Lab work: Your physician recommends that you return for lab work today: CMP, lipid panel.   If you have labs (blood work) drawn today and your tests are completely normal, you will receive your results only by: [name] MyChart Message (if you have MyChart) OR . A paper copy in the mail If you have any lab test that is abnormal or we need to change your treatment, we will call you to review the results.  Testing/Procedures: You had an EKG today.   Your physician has recommended that you wear a ZIO monitor. ZIO monitors are medical devices that record the heart's electrical activity. Doctors most often use these monitors to diagnose arrhythmias. Arrhythmias are problems with the speed or rhythm of the heartbeat. The monitor is a small, portable device. You can wear one while you do your normal daily activities. This is usually used to diagnose what is causing palpitations/syncope (passing out). Wear for 7 days.   Follow-Up: At [facility], you and your health needs are our priority.  As part of our continuing mission to provide you with exceptional heart care, we have created designated Provider Care Teams.  These Care Teams include your primary Cardiologist (physician) and Advanced Practice Providers (APPs -  Physician Assistants and Nurse Practitioners) who all work together to provide you with the care you need, when you need it. You will need a follow up appointment in 6 weeks.

## 2018-08-25 LAB — COMPREHENSIVE METABOLIC PANEL
ALT: 19 IU/L (ref 0–44)
AST: 29 IU/L (ref 0–40)
Albumin/Globulin Ratio: 2.2 (ref 1.2–2.2)
Albumin: 4.6 g/dL (ref 3.7–4.7)
Alkaline Phosphatase: 51 IU/L (ref 39–117)
BUN/Creatinine Ratio: 10 (ref 10–24)
BUN: 11 mg/dL (ref 8–27)
Bilirubin Total: 0.3 mg/dL (ref 0.0–1.2)
CO2: 23 mmol/L (ref 20–29)
Calcium: 10 mg/dL (ref 8.6–10.2)
Chloride: 99 mmol/L (ref 96–106)
Creatinine, Ser: 1.09 mg/dL (ref 0.76–1.27)
GFR calc Af Amer: 76 mL/min/{1.73_m2} (ref >59)
GFR calc non Af Amer: 66 mL/min/{1.73_m2} (ref >59)
Globulin, Total: 2.1 g/dL (ref 1.5–4.5)
Glucose: 130 mg/dL — ABNORMAL HIGH (ref 65–99)
Potassium: 4.7 mmol/L (ref 3.5–5.2)
Sodium: 139 mmol/L (ref 134–144)
Total Protein: 6.7 g/dL (ref 6.0–8.5)

## 2018-08-25 LAB — LIPID PANEL
Chol/HDL Ratio: 2.5 ratio (ref 0.0–5.0)
Cholesterol, Total: 156 mg/dL (ref 100–199)
HDL: 62 mg/dL (ref >39)
LDL Calculated: 80 mg/dL (ref 0–99)
Triglycerides: 69 mg/dL (ref 0–149)
VLDL Cholesterol Cal: 14 mg/dL (ref 5–40)

## 2018-09-01 ENCOUNTER — Ambulatory Visit (INDEPENDENT_AMBULATORY_CARE_PROVIDER_SITE_OTHER): Payer: Medicare Other

## 2018-09-01 ENCOUNTER — Other Ambulatory Visit: Payer: Self-pay

## 2018-09-01 DIAGNOSIS — R55 Syncope and collapse: Secondary | ICD-10-CM

## 2018-09-01 DIAGNOSIS — I482 Chronic atrial fibrillation, unspecified: Secondary | ICD-10-CM

## 2018-09-24 ENCOUNTER — Telehealth: Payer: Self-pay | Admitting: Cardiology

## 2018-09-24 NOTE — Telephone Encounter (Signed)
Virtual Visit Pre-Appointment Phone Call  Steps For Call:  1. Confirm consent - "In the setting of the current Covid19 crisis, you are scheduled for a (phone or video) visit with your provider on (date) at (time).  Just as we do with many in-office visits, in order for you to participate in this visit, we must obtain consent.  If you'd like, I can send this to your mychart (if signed up) or email for you to review.  Otherwise, I can obtain your verbal consent now.  All virtual visits are billed to your insurance company just like a normal visit would be.  By agreeing to a virtual visit, we'd like you to understand that the technology does not allow for your provider to perform an examination, and thus may limit your provider's ability to fully assess your condition.  Finally, though the technology is pretty good, we cannot assure that it will always work on either your or our end, and in the setting of a video visit, we may have to convert it to a phone-only visit.  In either situation, we cannot ensure that we have a secure connection.  Are you willing to proceed?"  2. Give patient instructions for WebEx download to smartphone as below if video visit  3. Advise patient to be prepared with any vital sign or heart rhythm information, their current medicines, and a piece of paper and pen handy for any instructions they may receive the day of their visit  4. Inform patient they will receive a phone call 15 minutes prior to their appointment time (may be from unknown caller ID) so they should be prepared to answer  5. Confirm that appointment type is correct in Epic appointment notes (video vs telephone)    TELEPHONE CALL NOTE  Corey Tate has been deemed a candidate for a follow-up tele-health visit to limit community exposure during the Covid-19 pandemic. I spoke with the patient via phone to ensure availability of phone/video source, confirm preferred email & phone number, and discuss  instructions and expectations.  I reminded Corey Tate to be prepared with any vital sign and/or heart rhythm information that could potentially be obtained via home monitoring, at the time of his visit. I reminded Corey Tate to expect a phone call at the time of his visit if his visit.  Did the patient verbally acknowledge consent to treatment? YES  Corey Tate 09/24/2018 10:06 AM   DOWNLOADING THE [place] TO SMARTPHONE  - If Apple, go to [employer] and type in WebEx in the search bar. [place] [employer], the blue/green circle. The app is free but as with any other app downloads, their phone may require them to verify saved payment information or Apple password. The patient does NOT have to create an account.  - If Android, ask patient to go to [employer] and type in WebEx in the search bar. [place] [employer], the blue/green circle. The app is free but as with any other app downloads, their phone may require them to verify saved payment information or Android password. The patient does NOT have to create an account.   CONSENT FOR TELE-HEALTH VISIT - PLEASE REVIEW  I hereby voluntarily request, consent and authorize [place] and its employed or contracted physicians, physician assistants, nurse practitioners or other licensed health care professionals (the Practitioner), to provide me with telemedicine health care services (the Services") as deemed necessary by the treating Practitioner. I  acknowledge and consent to receive the Services by the Practitioner via telemedicine. I understand that the telemedicine visit will involve communicating with the Practitioner through live audiovisual communication technology and the disclosure of certain medical information by electronic transmission. I acknowledge that I have been given the opportunity to request an in-person assessment or other available alternative prior to the telemedicine visit and am  voluntarily participating in the telemedicine visit.  I understand that I have the right to withhold or withdraw my consent to the use of telemedicine in the course of my care at any time, without affecting my right to future care or treatment, and that the Practitioner or I may terminate the telemedicine visit at any time. I understand that I have the right to inspect all information obtained and/or recorded in the course of the telemedicine visit and may receive copies of available information for a reasonable fee.  I understand that some of the potential risks of receiving the Services via telemedicine include:   Delay or interruption in medical evaluation due to technological equipment failure or disruption;  Information transmitted may not be sufficient (e.g. poor resolution of images) to allow for appropriate medical decision making by the Practitioner; and/or   In rare instances, security protocols could fail, causing a breach of personal health information.  Furthermore, I acknowledge that it is my responsibility to provide information about my medical history, conditions and care that is complete and accurate to the best of my ability. I acknowledge that Practitioner's advice, recommendations, and/or decision may be based on factors not within their control, such as incomplete or inaccurate data provided by me or distortions of diagnostic images or specimens that may result from electronic transmissions. I understand that the practice of medicine is not an exact science and that Practitioner makes no warranties or guarantees regarding treatment outcomes. I acknowledge that I will receive a copy of this consent concurrently upon execution via email to the email address I last provided but may also request a printed copy by calling the office of [facility] [place].    I understand that my insurance will be billed for this visit.   I have read or had this consent read to me.  I understand the  contents of this consent, which adequately explains the benefits and risks of the Services being provided via telemedicine.   I have been provided ample opportunity to ask questions regarding this consent and the Services and have had my questions answered to my satisfaction.  I give my informed consent for the services to be provided through the use of telemedicine in my medical care  By participating in this telemedicine visit I agree to the above.

## 2018-09-24 NOTE — Telephone Encounter (Signed)
Received message that patient reported episode of left arm and lip numbness yesterday, contacted patient to assess. States he experienced numbness to left arm and lips lasting approximately 30 minutes and it was the first time it's happened. He has not been checking blood pressures, heart rates or weights lately, but checked on the call with blood pressure 183/96, heart rate 88 and weight 169 pounds. He states his heart rate feels normal/doesn't notice any skipped beats.  He states he's no longer taking norvasc, says it was stopped because his heart rate was too low but he is currently taking aspirin 81 mg.   Virtual visit scheduled 10/05/18  pls advise

## 2018-09-24 NOTE — Telephone Encounter (Signed)
I called patient for Televisit Consent and he stated that he had an episode of lips and left arm numbness yesterday that didn't last very long but wanted to send a note to you for follow up. Pt is scheduled for Tele on 04/14 with [facility].

## 2018-10-05 ENCOUNTER — Telehealth (INDEPENDENT_AMBULATORY_CARE_PROVIDER_SITE_OTHER): Payer: Medicare Other | Admitting: Cardiology

## 2018-10-05 ENCOUNTER — Other Ambulatory Visit: Payer: Self-pay

## 2018-10-05 ENCOUNTER — Encounter: Payer: Self-pay | Admitting: Cardiology

## 2018-10-05 VITALS — BP 142/86 | HR 82 | Ht 71.0 in | Wt 162.0 lb

## 2018-10-05 DIAGNOSIS — N182 Chronic kidney disease, stage 2 (mild): Secondary | ICD-10-CM

## 2018-10-05 DIAGNOSIS — J449 Chronic obstructive pulmonary disease, unspecified: Secondary | ICD-10-CM

## 2018-10-05 DIAGNOSIS — I480 Paroxysmal atrial fibrillation: Secondary | ICD-10-CM

## 2018-10-05 DIAGNOSIS — I129 Hypertensive chronic kidney disease with stage 1 through stage 4 chronic kidney disease, or unspecified chronic kidney disease: Secondary | ICD-10-CM

## 2018-10-05 DIAGNOSIS — E785 Hyperlipidemia, unspecified: Secondary | ICD-10-CM

## 2018-10-05 DIAGNOSIS — R55 Syncope and collapse: Secondary | ICD-10-CM

## 2018-10-05 DIAGNOSIS — Z7189 Other specified counseling: Secondary | ICD-10-CM

## 2018-10-05 NOTE — Progress Notes (Signed)
Virtual Visit via Telephone Note   This visit type was conducted due to national recommendations for restrictions regarding the COVID-19 Pandemic (e.g. social distancing) in an effort to limit this patient's exposure and mitigate transmission in our community.  Due to his co-morbid illnesses, this patient is at least at moderate risk for complications without adequate follow up.  This format is felt to be most appropriate for this patient at this time.  The patient did not have access to video technology/had technical difficulties with video requiring transitioning to audio format only (telephone).  All issues noted in this document were discussed and addressed.  No physical exam could be performed with this format.  Please refer to the patient's chart for his  consent to telehealth for [facility].   Evaluation Performed:  Follow-up visit  Date:  10/05/2018   ID:  Corey Tate, Corey Tate January 23, 1941, MRN [id number]  Patient Location: Home  Provider Location: Office  PCP:  Imagene Riches, NP  Cardiologist:  No primary care provider on file. Dr Bettina Gavia Electrophysiologist:  None  Chief Complaint: Follow-up atrial fibrillation paroxysmal and syncope  History of Present Illness:    Corey Tate is a 78 y.o. male with paroxysmal atrial fibrillation last seen 08/24/2018 after syncopal episode associated with a slow ventricular response.  Rate suppressant medication was withdrawn and a follow-up ambulatory heart rhythm monitor showed an adequate heart rate without episodes of bradycardia or pauses.  Other medical problems include severe COPD hypertension hyperlipidemia and peripheral vascular disease  The patient does not have symptoms concerning for COVID-19 infection (fever, chills, cough, or new shortness of breath).   Unfortunately he has no access to technology for video visit.  Fortunately he is feeling better he is quite limited by his COPD with shortness of breath and fatigue but has had no  palpitation syncope chest pain edema orthopnea and says that his heart rate remains greater than 60 and blood pressure typically less than 140 off rate suppressant medication.  He is no longer anticoagulated.  I advised him to continue following his heart rate and blood pressure at home contact me if he is having's heart rates less than 50 systolics greater than 762 at this time I would not advise a pacemaker implanted loop recorder.  He is comfortable with this approach.  He will be seen back in the office in 6 months for follow-up Past Medical History:  Diagnosis Date  . Anxiety 12/11/2017  . Arthritis   . Asthma    ?  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 Atrial flutter by electrocardiogram ([place]) 12/10/2016  . Bilateral carotid artery stenosis 01/31/2016  . Cancer ([place])    SKIN CANCERS  . Carotid bruit 12/11/2017  . COPD, severe ([place]) 03/04/2016  . Cough 03/04/2016  . Degenerative arthritis of hip 04/02/2012  . Dyslipidemia 09/26/2015  . Dysrhythmia    "SKIPS"  . GERD (gastroesophageal reflux disease)   . H/O varicella 12/11/2017  . History of atrial fibrillation 12/10/2016  . Hypertension   . Nicotine dependence, uncomplicated 01/24/1516  . Nocturnal hypoxemia 03/04/2016  . Peripheral vascular disease ([place])   . Permanent atrial fibrillation 06/29/2017  . Shortness of breath    USES OXYGEN AT NIGHT--HX OF RIGHT LOWER LOBE PULMONARY NODULE--FOLLOWED BY PT'S MEDICAL DOCTOR AND HAS HAD FOR YEARS  . Smoking greater than 40 pack years 03/04/2016  . Syncope 08/04/2016   Past Surgical History:  Procedure Laterality Date  .  BACK SURGERY     LOWER BACK SURGERY X 3 - FUSION  . CARPAL TUNNEL RELEASE AND SURGERY LEFT ELBOW    . CATARACT EXTRACTION    . CERVICAL DISC SURGERY     FUSION - ONLY SLIGHT LIMITATION IN NECK MOVEMENT  . POSTERIOR CERVICAL FUSION/FORAMINOTOMY Bilateral 09/30/2012   Procedure: CERVICAL SEVEN AND THORACIC ONE BILATERAL POSTERIOR CERVICAL  FUSION/FORAMINOTOMY ;  Surgeon: Eustace Moore, MD;  Location: [place] [facility];  Service: Neurosurgery;  Laterality: Bilateral;  . RIGHT SHOULDER SURGERY    . SINUS SURGERY WITH INSTATRAK    . TOTAL HIP ARTHROPLASTY  04/02/2012   Procedure: TOTAL HIP ARTHROPLASTY ANTERIOR APPROACH;  Surgeon: Mcarthur Rossetti, MD;  Location: [facility];  Service: Orthopedics;  Laterality: Right;  Right Total Hip Arthroplasty  . VASCULAR SURGERY     STENT PLACEMENT RIGHT LEG AND "ROTOR ROOTER" LEFT LEG     Current Meds  Medication Sig  . albuterol (PROVENTIL HFA;VENTOLIN HFA) 108 (90 Base) MCG/ACT inhaler Inhale into the lungs every 6 (six) hours as needed for wheezing or shortness of breath.  Jearl Klinefelter ELLIPTA 62.5-25 MCG/INH AEPB Inhale 1 Pump into the lungs daily.  [name] aspirin EC 81 MG tablet Take 1 tablet by mouth daily.  [name] docusate sodium (COLACE) 100 MG capsule Take 100 mg by mouth daily.  . famotidine (PEPCID) 20 MG tablet Take 20 mg by mouth daily.  [name] ibuprofen (ADVIL,MOTRIN) 100 MG tablet Take 100 mg by mouth every 6 (six) hours as needed for fever.  . Menaquinone-7 (VITAMIN K2 PO) Take 1 tablet by mouth daily.   . Omega-3 Fatty Acids (FISH OIL) 1000 MG CAPS Take 1 capsule by mouth daily.  . pravastatin (PRAVACHOL) 40 MG tablet Take 40 mg by mouth daily.   . vitamin B-12 (CYANOCOBALAMIN) 500 MCG tablet Take 500 mcg by mouth daily.   . vitamin C (ASCORBIC ACID) 500 MG tablet Take 500 mg by mouth daily.     Allergies:   Ferra-caps [iron] and Streptomycin   Social History   Tobacco Use  . Smoking status: Current Some Day Smoker    Packs/day: 1.00    Years: 55.00    Pack years: 55.00    Types: Cigarettes  . Smokeless tobacco: Former Systems developer    Types: Chew  Substance Use Topics  . Alcohol use: Yes    Alcohol/week: 24.0 standard drinks    Types: 24 Cans of beer per week    Comment: 6 BEERS A DAY  . Drug use: No     Family Hx: The patient's family history includes Cancer in his father; Diabetes in  his brother and sister.  ROS:   Please see the history of present illness.     All other systems reviewed and are negative.   Prior CV studies:   The following studies were reviewed today:  Hughie Closs  LONG TERM MONITOR-HOOKUP & INTERPRETATION  Order# [id number]  Reading physician: Richardo Priest, MD Ordering physician: Richardo Priest, MD Study date: 09/01/18  Patient Information  Name MRN Description  TRA WILEMON [id number] 78 y.o. male  Result Notes for LONG TERM MONITOR (3-14 DAYS)  Notes recorded by Stevan Born, CMA on 09/14/2018 at 3:38 PM EDT Patient's wife Margaretha Sheffield notified of monitor results per Dr Bettina Gavia. ------  Notes recorded by Richardo Priest, MD on 09/14/2018 at 3:19 PM EDT Normal or stable result  Looks good I do not feel he needs a pacemaker  Vitals   Height Weight BMI (Calculated)  5\' 11"  (1.803 m) 173 lb (78.5 kg) 24.14  Study Highlights   A Zio monitor was performed 7 days 6 hours beginning 09/01/2018 to assess syncope.   The rhythm throughout is sinus with minimum average and maximum heart rates of 40, 79, and 136 bpm.  The minimum rate was sinus bradycardia.   Ventricular ectopy was occasional 1.8% burden of PVCs with rare couplet and triplet.   Supraventricular ectopy was frequent with frequent APCs and rare couplets and triplets.  There were no episodes of atrial fibrillation or flutter.   There were no episodes of second or third-degree A-V block or sinus node exit block and no episodes of pauses greater than 3 seconds.   There were no symptomatic or triggered events     Labs/Other Tests and Data Reviewed:    EKG:  No ECG reviewed.  Recent Labs: 08/24/2018: ALT 19; BUN 11; Creatinine, Ser 1.09; Potassium 4.7; Sodium 139   Recent Lipid Panel Lab Results  Component Value Date/Time   CHOL 156 08/24/2018 03:10 PM   TRIG 69 08/24/2018 03:10 PM   HDL 62 08/24/2018 03:10 PM   CHOLHDL 2.5 08/24/2018 03:10 PM   LDLCALC 80 08/24/2018  03:10 PM    Wt Readings from Last 3 Encounters:  08/24/18 173 lb (78.5 kg)  02/09/18 165 lb (74.8 kg)  12/31/17 169 lb 6.4 oz (76.8 kg)     Objective:    Vital Signs:  There were no vitals taken for this visit.    male in no acute distress.  He was alert oriented thought and perception were normal   ASSESSMENT & PLAN:    1. Paroxysmal atrial fibrillation stable maintaining sinus rhythm he has no bradycardia recurrent syncope off suppressant medications.  If he required rate suppressant medication with paroxysmal atrial fibrillation or an antiarrhythmic drug we need to consider the merits of a backup pacemaker.  At this time he is not anticoagulated will continue low-dose aspirin and readdress the issue of face-to-face office follow-up in 6 months 2. Stable continue current treatment with a statin 3. Stable presently on no anti-hypertensive agent if systolics are greater than 150 we could choose low-dose hydralazine which of anything would have a reflex stimulation of heart rate 4. Stable COPD continue his current bronchodilators directed by his PCP 5. Syncope in the setting of paroxysmal atrial fibrillation and bradycardia he is off rate suppressant medications fortunately has had no recurrence and at this time would not advise a pacemaker.  If he had another episode in sinus rhythm he be a good candidate for implanted loop recorder. 6. Continue social distance and good handwashing sanitizing technique  COVID-19 Education: The signs and symptoms of COVID-19 were discussed with the patient and how to seek care for testing (follow up with PCP or arrange E-visit).  The importance of social distancing was discussed today.  Time:   Today, I have spent 18 minutes with the patient with telehealth technology discussing the above problems.     Medication Adjustments/Labs and Tests Ordered: Current medicines are reviewed at length with the patient today.  Concerns regarding medicines are  outlined above.   Tests Ordered: No orders of the defined types were placed in this encounter.   Medication Changes: No orders of the defined types were placed in this encounter.   Disposition:  Follow up in 6 month(s)  Signed, Shirlee More, MD  10/05/2018 1:59 PM    [facility]

## 2018-10-05 NOTE — Patient Instructions (Signed)
Medication Instructions:  Your physician recommends that you continue on your current medications as directed. Please refer to the Current Medication list given to you today.  If you need a refill on your cardiac medications before your next appointment, please call your pharmacy.   Lab work: None  If you have labs (blood work) drawn today and your tests are completely normal, you will receive your results only by: [name] MyChart Message (if you have MyChart) OR . A paper copy in the mail If you have any lab test that is abnormal or we need to change your treatment, we will call you to review the results.  Testing/Procedures: None  Follow-Up: At [facility], you and your health needs are our priority.  As part of our continuing mission to provide you with exceptional heart care, we have created designated Provider Care Teams.  These Care Teams include your primary Cardiologist (physician) and Advanced Practice Providers (APPs -  Physician Assistants and Nurse Practitioners) who all work together to provide you with the care you need, when you need it. You will need a follow up appointment in 6 months.   Any Other Special Instructions Will Be Listed Below (If Applicable).

## 2019-04-06 ENCOUNTER — Ambulatory Visit: Payer: Medicare Other | Admitting: Cardiology

## 2019-05-10 ENCOUNTER — Ambulatory Visit (INDEPENDENT_AMBULATORY_CARE_PROVIDER_SITE_OTHER): Payer: Medicare Other | Admitting: Cardiology

## 2019-05-10 ENCOUNTER — Other Ambulatory Visit: Payer: Self-pay

## 2019-05-10 ENCOUNTER — Encounter: Payer: Self-pay | Admitting: Cardiology

## 2019-05-10 VITALS — BP 152/80 | HR 88 | Ht 71.0 in | Wt 166.6 lb

## 2019-05-10 DIAGNOSIS — I480 Paroxysmal atrial fibrillation: Secondary | ICD-10-CM

## 2019-05-10 DIAGNOSIS — Z8679 Personal history of other diseases of the circulatory system: Secondary | ICD-10-CM | POA: Diagnosis not present

## 2019-05-10 NOTE — Patient Instructions (Signed)
Medication Instructions:  Your physician recommends that you continue on your current medications as directed. Please refer to the Current Medication list given to you today.  *If you need a refill on your cardiac medications before your next appointment, please call your pharmacy*  Lab Work: None  If you have labs (blood work) drawn today and your tests are completely normal, you will receive your results only by: [name] MyChart Message (if you have MyChart) OR . A paper copy in the mail If you have any lab test that is abnormal or we need to change your treatment, we will call you to review the results.  Testing/Procedures: You had an EKG today.   Follow-Up: At [facility], you and your health needs are our priority.  As part of our continuing mission to provide you with exceptional heart care, we have created designated Provider Care Teams.  These Care Teams include your primary Cardiologist (physician) and Advanced Practice Providers (APPs -  Physician Assistants and Nurse Practitioners) who all work together to provide you with the care you need, when you need it.  Your next appointment:   6 months  The format for your next appointment:   In Person  Provider:   Shirlee More, MD  Other Instructions **Check your blood pressure at home a few times each week and record these readings. Bring your BP log with you to your next appointment for Dr. Bettina Gavia to review.   **Wear eye protection out in public!

## 2019-05-10 NOTE — Progress Notes (Signed)
Cardiology Office Note:    Date:  05/10/2019   ID:  Terreon, Gelder 07/16/1940, MRN [id number]  PCP:  Imagene Riches, NP  Cardiologist:  Shirlee More, MD    Referring MD: Imagene Riches, NP    ASSESSMENT:    No diagnosis found. PLAN:    In order of problems listed above:  1. Paroxysmal atrial fibrillation stable no recurrence, at this time not on suppressive therapy because of previous profound syncopal episode felt to be due to bradycardia.  If he had recurrence and required suppressant treatment he need a backup pacemaker and at this time I am not can ask him to restart anticoagulants. 2. COPD stable managed by his PCP 3. 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  Concerns regarding medicines are outlined above.  No orders of the defined types were placed in this encounter.  No orders of the defined types were placed in this encounter.   Chief Complaint  Patient presents with  . Follow-up  . Atrial Fibrillation  . Loss of Consciousness    History of Present Illness:    Corey Tate is a 78 y.o. male with a hx of  paroxysmal atrial fibrillation last seen 08/24/2018 after syncopal episode associated with a slow ventricular response.  Rate suppressant medication was withdrawn and a follow-up ambulatory heart rhythm monitor showed an adequate heart rate without episodes of bradycardia or pauses.  Other medical problems include severe COPD hypertension hyperlipidemia and peripheral vascular disease  He was last seen 10/15/2018 virtual visit. Compliance with diet, lifestyle and medications: Yes  Continues to have wheezing shortness of breath stable pattern due to  COPD he has had no palpitation or syncope no lightheadedness.  He screens his heart rate and blood pressure at home and has had no signs of slow rates recurrent atrial fibrillation.  No edema orthopnea chest pain Past Medical History:  Diagnosis Date  . Anxiety 12/11/2017  . Arthritis   . Asthma    ?  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 Atrial flutter by electrocardiogram ([place]) 12/10/2016  . Bilateral carotid artery stenosis 01/31/2016  . Cancer ([place])    SKIN CANCERS  . Carotid bruit 12/11/2017  . COPD, severe ([place]) 03/04/2016  . Cough 03/04/2016  . Degenerative arthritis of hip 04/02/2012  . Dyslipidemia 09/26/2015  . Dysrhythmia    "SKIPS"  . GERD (gastroesophageal reflux disease)   . H/O varicella 12/11/2017  . History of atrial fibrillation 12/10/2016  . Hypertension   . Nicotine dependence, uncomplicated 0000000  . Nocturnal hypoxemia 03/04/2016  . Peripheral vascular disease ([place])   . Permanent atrial fibrillation ([facility]) 06/29/2017  . Shortness of breath    USES OXYGEN AT NIGHT--HX OF RIGHT LOWER LOBE PULMONARY NODULE--FOLLOWED BY PT'S MEDICAL DOCTOR AND HAS HAD FOR YEARS  . Smoking greater than 40 pack years 03/04/2016  . Syncope 08/04/2016    Past Surgical History:  Procedure Laterality Date  . BACK SURGERY     LOWER BACK SURGERY X 3 - FUSION  . CARPAL TUNNEL RELEASE AND SURGERY LEFT ELBOW    . CATARACT EXTRACTION    . CERVICAL DISC SURGERY     FUSION -  ONLY SLIGHT LIMITATION IN NECK MOVEMENT  . POSTERIOR CERVICAL FUSION/FORAMINOTOMY Bilateral 09/30/2012   Procedure: CERVICAL SEVEN AND THORACIC ONE BILATERAL POSTERIOR CERVICAL FUSION/FORAMINOTOMY ;  Surgeon: Eustace Moore, MD;  Location: [place] [facility];  Service: Neurosurgery;  Laterality: Bilateral;  . RIGHT SHOULDER SURGERY    . SINUS SURGERY WITH INSTATRAK    . TOTAL HIP ARTHROPLASTY  04/02/2012   Procedure: TOTAL HIP ARTHROPLASTY ANTERIOR APPROACH;  Surgeon: Mcarthur Rossetti,  MD;  Location: [facility];  Service: Orthopedics;  Laterality: Right;  Right Total Hip Arthroplasty  . VASCULAR SURGERY     STENT PLACEMENT RIGHT LEG AND "ROTOR ROOTER" LEFT LEG    Current Medications: Current Meds  Medication Sig  . albuterol (PROVENTIL HFA;VENTOLIN HFA) 108 (90 Base) MCG/ACT inhaler Inhale into the lungs every 6 (six) hours as needed for wheezing or shortness of breath.  Jearl Klinefelter ELLIPTA 62.5-25 MCG/INH AEPB Inhale 1 Pump into the lungs daily.  [name] aspirin EC 81 MG tablet Take 1 tablet by mouth daily.  [name] docusate sodium (COLACE) 100 MG capsule Take 100 mg by mouth daily.  . famotidine (PEPCID) 20 MG tablet Take 20 mg by mouth daily.  [name] ibuprofen (ADVIL,MOTRIN) 100 MG tablet Take 100 mg by mouth every 6 (six) hours as needed for fever.  . Menaquinone-7 (VITAMIN K2 PO) Take 1 tablet by mouth daily.   . Multiple Vitamin (MULTIVITAMIN) tablet Take 1 tablet by mouth daily.  . Omega-3 Fatty Acids (FISH OIL) 1000 MG CAPS Take 1 capsule by mouth daily.  . OXYGEN Inhale 2 L into the lungs as needed.  . pravastatin (PRAVACHOL) 40 MG tablet Take 40 mg by mouth daily.   . vitamin B-12 (CYANOCOBALAMIN) 500 MCG tablet Take 500 mcg by mouth daily.   . vitamin C (ASCORBIC ACID) 500 MG tablet Take 500 mg by mouth daily.     Allergies:   Ferra-caps [iron] and Streptomycin   Social History   Socioeconomic History  . Marital status: Married    Spouse name: Not on file  . Number of children: Not on file  . Years of education: Not on file  . Highest education level: Not on file  Occupational History  . Not on file  Social Needs  . Financial resource strain: Not on file  . Food insecurity    Worry: Not on file    Inability: Not on file  . Transportation needs    Medical: Not on file    Non-medical: Not on file  Tobacco Use  . Smoking status: Current Some Day Smoker    Packs/day: 1.00    Years: 55.00    Pack years: 55.00    Types: Cigarettes  . Smokeless tobacco: Former Systems developer     Types: Chew  Substance and Sexual Activity  . Alcohol use: Yes    Alcohol/week: 24.0 standard drinks    Types: 24 Cans of beer per week    Comment: 6 BEERS A DAY  . Drug use: No  . Sexual activity: Not on file  Lifestyle  . Physical activity    Days per week: Not on file    Minutes per session: Not on file  . Stress: Not on file  Relationships  . Social Herbalist on phone: Not on file    Gets together: Not on file    Attends religious service: Not on file    Active member of club or organization: Not on file    Attends meetings  of clubs or organizations: Not on file    Relationship status: Not on file  Other Topics Concern  . Not on file  Social History Narrative  . Not on file     Family History: The patient's family history includes Cancer in his father; Diabetes in his brother and sister. ROS:   Please see the history of present illness.    All other systems reviewed and are negative.  EKGs/Labs/Other Studies Reviewed:    The following studies were reviewed today:  EKG:  EKG ordered today and personally reviewed.  The ekg ordered today demonstrates sinus rhythm EKG today is normal  Recent Labs: 08/24/2018: ALT 19; BUN 11; Creatinine, Ser 1.09; Potassium 4.7; Sodium 139  Recent Lipid Panel    Component Value Date/Time   CHOL 156 08/24/2018 1510   TRIG 69 08/24/2018 1510   HDL 62 08/24/2018 1510   CHOLHDL 2.5 08/24/2018 1510   LDLCALC 80 08/24/2018 1510    Physical Exam:    VS:  BP (!) 152/80 (BP Location: Right Arm, Patient Position: Sitting, Cuff Size: Normal)   Pulse 88   Ht 5\' 11"  (1.803 m)   Wt 166 lb 9.6 oz (75.6 kg)   SpO2 91%   BMI 23.24 kg/m     Wt Readings from Last 3 Encounters:  05/10/19 166 lb 9.6 oz (75.6 kg)  10/05/18 162 lb (73.5 kg)  08/24/18 173 lb (78.5 kg)     GEN:  Well nourished, well developed in no acute distress HEENT: Normal NECK: No JVD; No carotid bruits LYMPHATICS: No lymphadenopathy CARDIAC: RRR, no  murmurs, rubs, gallops RESPIRATORY:  Clear to auscultation without rales, wheezing or rhonchi  ABDOMEN: Soft, non-tender, non-distended MUSCULOSKELETAL:  No edema; No deformity  SKIN: Warm and dry NEUROLOGIC:  Alert and oriented x 3 PSYCHIATRIC:  Normal affect    Signed, Shirlee More, MD  05/10/2019 2:06 PM    [place]

## 2019-06-24 DIAGNOSIS — J189 Pneumonia, unspecified organism: Secondary | ICD-10-CM

## 2019-06-24 HISTORY — DX: Pneumonia, unspecified organism: J18.9

## 2019-11-16 ENCOUNTER — Telehealth: Payer: Self-pay | Admitting: Cardiology

## 2019-11-16 NOTE — Telephone Encounter (Signed)
   [facility] called, asked pt's last carotid.

## 2019-12-11 NOTE — Progress Notes (Signed)
Cardiology Office Note:    Date:  12/12/2019   ID:  Corey Tate, Corey Tate 10-19-1940, MRN [id number]  PCP:  Imagene Riches, NP  Cardiologist:  Shirlee More, MD    Referring MD: Imagene Riches, NP    ASSESSMENT:    1. Bilateral carotid artery stenosis   2. Vertebral artery obstruction, bilateral   3. AAA (abdominal aortic aneurysm) without rupture ([place])   4. Bilateral renal artery stenosis ([facility])   5. PAF (paroxysmal atrial fibrillation) ([place])   6. 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 COPD, severe ([place])   8. Dyslipidemia    PLAN:    In order of problems listed above:  1. We will intensify medical therapy for his diffuse atherosclerosis including abstinence from smoking and addition of clopidogrel to aspirin in addition to Zetia to statin reinstitution of antihypertensive therapy with amlodipine to avoid bradycardia. 2. Follow-up duplex abdominal aorta in 1 year 3. If blood pressure cannot be controlled PCI would be an option avoid ACE and ARB with bilateral renal artery stenosis 4. No recurrence of atrial fibrillation or bradycardia.  We will plan a single dose of oral Lopressor prior to his cardiac CTA his resting heart rate in the office today is 76 5. COPD is unchanged continue bronchodilators 6. Intensified lipid-lowering therapy   Next appointment: 6 weeks after his cardiac CTA   Medication Adjustments/Labs and Tests Ordered: Current medicines are reviewed at length with the patient today.  Concerns regarding medicines are outlined above.  No orders of the defined types were placed in this encounter.  No orders of the defined types were placed in this encounter.   Chief Complaint  Patient presents with  . Follow-up    After both head and neck CTA abdominal CTA and cerebral vascular duplex  . PAD  . AAA    History of Present Illness:    Corey Tate is a 79 y.o. male with a hx of paroxysmal atrial fibrillation last seen 08/24/2018  after syncopal episode associated with a slow ventricular response.  Rate suppressant medication was withdrawn and a follow-up ambulatory heart rhythm monitor showed an adequate heart rate without episodes of bradycardia or pauses.  Other medical problems include severe COPD hypertension hyperlipidemia and peripheral vascular disease  last seen 04/30/2019.  He has been seen by vascular surgery most recently August 2019 in follow-up for his carotid artery disease and peripheral arterial disease with SFA interventions in the past. Compliance with diet, lifestyle and medications: Yes  He is seen today after having CTA and duplex performed at [facility].  I obtained and reviewed the reports and summarized below He had CTA head neck 12/05/2019 which shows severe atherosclerosis aortic arch head and neck right vertebral artery is occluded proximal right internal carotid artery tandem 60 to 65% stenosis stable severe stenosis left vertebral artery and also heavy intracranial calcification bilaterally of the ICA siphon.  Report from 2018 is similar except the right vertebral artery was not occluded at that time.  I do not see any previous evaluation of abdominal aorta in care everywhere. Carotid duplex performed 11/25/2019 showed  less than 50% stenosis on the right 50 to 69% stenosis on the left. He also had CT angio of the abdomen showed a 3.5 centimeter infrarenal abdominal aortic aneurysm bilateral renal artery stenosis and left hydronephrosis.  Recent laboratory studies performed 11/09/2019 CMP showed a sodium reduced mildly 132 creatinine 1.24 potassium 4.2 GFR stage II CKD  56 cc lipid profile cholesterol 164 triglycerides 103 HDL 71 LDL cholesterol 74 TSH was normal CBC was normal A1c 6.3%  He has no history of stroke and has had no recent TIA or neurologic symptoms.  His blood pressure has been elevated at times in the 1 16-1 70 systolic range.  He is off his calcium channel blocker because of previous  symptomatic bradycardia.  I reviewed with the patient his daughter who was present and participated in the evaluation there is a small abdominal aortic aneurysm will need a follow-up duplex in 6 months.  He has diffuse atherosclerosis and he needs to stop smoking.  That he benefit from dual antiplatelet therapy start clopidogrel he would benefit from more intensive lipid-lowering therapy add Zetia to his statin a benefit from antihypertensive steroid therapy and I put him on a nonrate limiting calcium channel blocker amlodipine.  Clearly is at risk for cardiovascular events with diffuse atherosclerosis he has exertional shortness of breath with his COPD we will plan on doing a cardiac CTA with 1 dose of oral Lopressor prior to the procedure with his resting bradycardia.  Not having edema orthopnea claudication syncope or palpitation.  Who presently is not anticoagulated.  Past Medical History:  Diagnosis Date  . Anxiety 12/11/2017  . Arthritis   . Asthma    ?  [name] Atherosclerosis of native artery of both lower extremities ([place]) 01/31/2016  . Atrial flutter by electrocardiogram ([place]) 12/10/2016  . Bilateral carotid artery stenosis 01/31/2016  . 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 Carotid bruit 12/11/2017  . COPD, severe ([place]) 03/04/2016  . Cough 03/04/2016  . Degenerative arthritis of hip 04/02/2012  . Dyslipidemia 09/26/2015  . Dysrhythmia    "SKIPS"  . GERD (gastroesophageal reflux disease)   . H/O varicella 12/11/2017  . History of atrial fibrillation 12/10/2016  . Hypertension   . Nicotine dependence, uncomplicated 0/02/6044  . Nocturnal hypoxemia 03/04/2016  . Peripheral vascular disease ([place])   . Permanent atrial fibrillation ([place]) 06/29/2017  . Shortness of breath    USES OXYGEN AT NIGHT--HX OF RIGHT LOWER LOBE PULMONARY NODULE--FOLLOWED BY PT'S MEDICAL DOCTOR AND HAS HAD FOR YEARS  . Smoking greater than 40 pack years 03/04/2016  . Syncope 08/04/2016    Past Surgical History:  Procedure  Laterality Date  . BACK SURGERY     LOWER BACK SURGERY X 3 - FUSION  . CARPAL TUNNEL RELEASE AND SURGERY LEFT ELBOW    . CATARACT EXTRACTION    . CERVICAL DISC SURGERY     FUSION - ONLY SLIGHT LIMITATION IN NECK MOVEMENT  . POSTERIOR CERVICAL FUSION/FORAMINOTOMY Bilateral 09/30/2012   Procedure: CERVICAL SEVEN AND THORACIC ONE BILATERAL POSTERIOR CERVICAL FUSION/FORAMINOTOMY ;  Surgeon: Eustace Moore, MD;  Location: [place] [facility];  Service: Neurosurgery;  Laterality: Bilateral;  . RIGHT SHOULDER SURGERY    . SINUS SURGERY WITH INSTATRAK    . TOTAL HIP ARTHROPLASTY  04/02/2012   Procedure: TOTAL HIP ARTHROPLASTY ANTERIOR APPROACH;  Surgeon: Mcarthur Rossetti, MD;  Location: [facility];  Service: Orthopedics;  Laterality: Right;  Right Total Hip Arthroplasty  . VASCULAR SURGERY     STENT PLACEMENT RIGHT LEG AND "ROTOR ROOTER" LEFT LEG    Current Medications: Current Meds  Medication Sig  . albuterol (PROVENTIL HFA;VENTOLIN HFA) 108 (90 Base) MCG/ACT inhaler Inhale into the lungs every 6 (six) hours as needed for wheezing or shortness of breath.  Jearl Klinefelter ELLIPTA 62.5-25 MCG/INH AEPB Inhale 1 Pump into the  lungs daily.  [name] aspirin EC 81 MG tablet Take 1 tablet by mouth daily.  . cyclobenzaprine (FLEXERIL) 10 MG tablet Take 10 mg by mouth 2 (two) times daily.  [name] diltiazem (TIAZAC) 360 MG 24 hr capsule   . docusate sodium (COLACE) 100 MG capsule Take 100 mg by mouth daily.  . famotidine (PEPCID) 20 MG tablet Take 20 mg by mouth daily.  [name] gabapentin (NEURONTIN) 300 MG capsule Take 300 mg by mouth 3 (three) times daily.  [name] ibuprofen (ADVIL,MOTRIN) 100 MG tablet Take 100 mg by mouth every 6 (six) hours as needed for fever.  . Menaquinone-7 (VITAMIN K2 PO) Take 1 tablet by mouth daily.   . Multiple Vitamin (MULTIVITAMIN) tablet Take 1 tablet by mouth daily.  . Omega-3 Fatty Acids (FISH OIL) 1000 MG CAPS Take 1 capsule by mouth daily.  . OXYGEN Inhale 2 L into the lungs as needed.  . pantoprazole  (PROTONIX) 40 MG tablet Take 40 mg by mouth daily.  . pravastatin (PRAVACHOL) 40 MG tablet Take 40 mg by mouth daily.   . vitamin B-12 (CYANOCOBALAMIN) 500 MCG tablet Take 500 mcg by mouth daily.   . vitamin C (ASCORBIC ACID) 500 MG tablet Take 500 mg by mouth daily.     Allergies:   Ferra-caps [iron] and Streptomycin   Social History   Socioeconomic History  . Marital status: Married    Spouse name: Not on file  . Number of children: Not on file  . Years of education: Not on file  . Highest education level: Not on file  Occupational History  . Not on file  Tobacco Use  . Smoking status: Current Some Day Smoker    Packs/day: 1.00    Years: 55.00    Pack years: 55.00    Types: Cigarettes  . Smokeless tobacco: Former Systems developer    Types: Secondary school teacher  . Vaping Use: Never used  Substance and Sexual Activity  . Alcohol use: Yes    Alcohol/week: 24.0 standard drinks    Types: 24 Cans of beer per week    Comment: 6 BEERS A DAY  . Drug use: No  . Sexual activity: Not on file  Other Topics Concern  . Not on file  Social History Narrative  . Not on file   Social Determinants of Health   Financial Resource Strain:   . Difficulty of Paying Living Expenses:   Food Insecurity:   . Worried About Charity fundraiser in the Last Year:   . Arboriculturist in the Last Year:   Transportation Needs:   . Film/video editor (Medical):   [name] Lack of Transportation (Non-Medical):   Physical Activity:   . Days of Exercise per Week:   . Minutes of Exercise per Session:   Stress:   . Feeling of Stress :   Social Connections:   . Frequency of Communication with Friends and Family:   . Frequency of Social Gatherings with Friends and Family:   . Attends Religious Services:   . Active Member of Clubs or Organizations:   . Attends Archivist Meetings:   [name] Marital Status:      Family History: The patient's family history includes Cancer in his father; Diabetes in his brother  and sister. ROS:   Please see the history of present illness.    All other systems reviewed and are negative.  EKGs/Labs/Other Studies Reviewed:    The following studies were reviewed today:  it is  Recent Labs: No results found for requested labs within last 8760 hours.  Recent Lipid Panel    Component Value Date/Time   CHOL 156 08/24/2018 1510   TRIG 69 08/24/2018 1510   HDL 62 08/24/2018 1510   CHOLHDL 2.5 08/24/2018 1510   LDLCALC 80 08/24/2018 1510    Physical Exam:    VS:  Ht 5\' 11"  (1.803 m)   Wt 163 lb (73.9 kg)   BMI 22.73 kg/m     Wt Readings from Last 3 Encounters:  12/12/19 163 lb (73.9 kg)  05/10/19 166 lb 9.6 oz (75.6 kg)  10/05/18 162 lb (73.5 kg)     GEN: Affect is flat well nourished, well developed in no acute distress HEENT: Normal NECK: No JVD; No carotid bruits LYMPHATICS: No lymphadenopathy CARDIAC: RRR, no murmurs, rubs, gallops RESPIRATORY:  Clear to auscultation without rales, wheezing or rhonchi  ABDOMEN: Soft, non-tender, non-distended MUSCULOSKELETAL:  No edema; No deformity  SKIN: Warm and dry NEUROLOGIC:  Alert and oriented x 3 PSYCHIATRIC:  Normal affect    Signed, Shirlee More, MD  12/12/2019 10:05 AM    [place]

## 2019-12-12 ENCOUNTER — Ambulatory Visit (INDEPENDENT_AMBULATORY_CARE_PROVIDER_SITE_OTHER): Payer: Medicare Other | Admitting: Cardiology

## 2019-12-12 ENCOUNTER — Encounter: Payer: Self-pay | Admitting: Cardiology

## 2019-12-12 ENCOUNTER — Other Ambulatory Visit: Payer: Self-pay

## 2019-12-12 VITALS — BP 180/98 | HR 76 | Ht 71.0 in | Wt 163.0 lb

## 2019-12-12 DIAGNOSIS — I714 Abdominal aortic aneurysm, without rupture, unspecified: Secondary | ICD-10-CM

## 2019-12-12 DIAGNOSIS — J449 Chronic obstructive pulmonary disease, unspecified: Secondary | ICD-10-CM

## 2019-12-12 DIAGNOSIS — I6503 Occlusion and stenosis of bilateral vertebral arteries: Secondary | ICD-10-CM

## 2019-12-12 DIAGNOSIS — I480 Paroxysmal atrial fibrillation: Secondary | ICD-10-CM

## 2019-12-12 DIAGNOSIS — N182 Chronic kidney disease, stage 2 (mild): Secondary | ICD-10-CM

## 2019-12-12 DIAGNOSIS — R079 Chest pain, unspecified: Secondary | ICD-10-CM

## 2019-12-12 DIAGNOSIS — I6523 Occlusion and stenosis of bilateral carotid arteries: Secondary | ICD-10-CM | POA: Diagnosis not present

## 2019-12-12 DIAGNOSIS — I129 Hypertensive chronic kidney disease with stage 1 through stage 4 chronic kidney disease, or unspecified chronic kidney disease: Secondary | ICD-10-CM

## 2019-12-12 DIAGNOSIS — E785 Hyperlipidemia, unspecified: Secondary | ICD-10-CM

## 2019-12-12 DIAGNOSIS — R931 Abnormal findings on diagnostic imaging of heart and coronary circulation: Secondary | ICD-10-CM

## 2019-12-12 DIAGNOSIS — R072 Precordial pain: Secondary | ICD-10-CM

## 2019-12-12 DIAGNOSIS — I701 Atherosclerosis of renal artery: Secondary | ICD-10-CM | POA: Diagnosis not present

## 2019-12-12 MED ORDER — CLOPIDOGREL BISULFATE 75 MG PO TABS
75.0000 mg | ORAL_TABLET | Freq: Every day | ORAL | 3 refills | Status: DC
Start: 2019-12-12 — End: 2021-03-07

## 2019-12-12 MED ORDER — EZETIMIBE 10 MG PO TABS: 10.0000 mg | ORAL_TABLET | Freq: Every day | ORAL | 3 refills | Status: DC

## 2019-12-12 MED ORDER — AMLODIPINE BESYLATE 5 MG PO TABS
5.0000 mg | ORAL_TABLET | Freq: Every day | ORAL | 3 refills | Status: DC
Start: 2019-12-12 — End: 2021-03-07

## 2019-12-12 MED ORDER — METOPROLOL TARTRATE 25 MG PO TABS
25.0000 mg | ORAL_TABLET | Freq: Once | ORAL | 0 refills | Status: DC
Start: 2019-12-12 — End: 2020-01-18

## 2019-12-12 NOTE — Patient Instructions (Signed)
Medication Instructions:  Your physician has recommended you make the following change in your medication:  START: Plavix 75 mg take one tablet by mouth daily.  START: Zetia 10 mg take one tablet by mouth daily.  START: Amlodipine 5 mg take one tablet by mouth daily.  *If you need a refill on your cardiac medications before your next appointment, please call your pharmacy*   Lab Work: Your physician recommends that you return for lab work in: Within one week of scheduled cardiac CT.  BMP If you have labs (blood work) drawn today and your tests are completely normal, you will receive your results only by:  [facility] (if you have MyChart) OR  A paper copy in the mail If you have any lab test that is abnormal or we need to change your treatment, we will call you to review the results.   Testing/Procedures: Your cardiac CT will be scheduled at the below location:   [facility] [address] [place], [place] [address] [phone]   If scheduled at [facility], please arrive at the [facility] main entrance of [facility] 30 minutes prior to test start time. Proceed to the [facility] Radiology Department (first floor) to check-in and test prep.   Please follow these instructions carefully (unless otherwise directed):  Hold all erectile dysfunction medications at least 3 days (72 hrs) prior to test.  On the Night Before the Test:  Be sure to Drink plenty of water.  Do not consume any caffeinated/decaffeinated beverages or chocolate 12 hours prior to your test.  Do not take any antihistamines 12 hours prior to your test.  On the Day of the Test:  Drink plenty of water. Do not drink any water within one hour of the test.  Do not eat any food 4 hours prior to the test.  You may take your regular medications prior to the test.   Take metoprolol (Lopressor) two hours prior to test.  After the Test:  Drink plenty of water.  After  receiving IV contrast, you may experience a mild flushed feeling. This is normal.  On occasion, you may experience a mild rash up to 24 hours after the test. This is not dangerous. If this occurs, you can take Benadryl 25 mg and increase your fluid intake.  If you experience trouble breathing, this can be serious. If it is severe call 911 IMMEDIATELY. If it is mild, please call our office.  If you take any of these medications: Glipizide/Metformin, Avandament, Glucavance, please do not take 48 hours after completing test unless otherwise instructed.   Once we have confirmed authorization from your insurance company, we will call you to set up a date and time for your test.   For non-scheduling related questions, please contact the cardiac imaging nurse navigator should you have any questions/concerns: Marchia Bond, Cardiac Imaging Nurse Navigator Burley Saver, Interim Cardiac Imaging Nurse [place] [facility] Direct Office Dial: [phone]   For scheduling needs, including cancellations and rescheduling, please call [phone].      Follow-Up: At [facility], you and your health needs are our priority.  As part of our continuing mission to provide you with exceptional heart care, we have created designated Provider Care Teams.  These Care Teams include your primary Cardiologist (physician) and Advanced Practice Providers (APPs -  Physician Assistants and Nurse Practitioners) who all work together to provide you with the care you need, when you need it.  We recommend signing up for the patient portal called "MyChart".  Sign up information is provided on this After Visit Summary.  MyChart is used to connect with patients for Virtual Visits (Telemedicine).  Patients are able to view lab/test results, encounter notes, upcoming appointments, etc.  Non-urgent messages can be sent to your provider as well.   To learn more about what you can do with MyChart, go to  [email or web address].    Your next appointment:   6 week(s)  The format for your next appointment:   In Person  Provider:   Shirlee More, MD   Other Instructions

## 2019-12-30 ENCOUNTER — Telehealth: Payer: Self-pay

## 2019-12-30 LAB — BASIC METABOLIC PANEL
BUN/Creatinine Ratio: 9 — ABNORMAL LOW (ref 10–24)
BUN: 10 mg/dL (ref 8–27)
CO2: 24 mmol/L (ref 20–29)
Calcium: 10.1 mg/dL (ref 8.6–10.2)
Chloride: 98 mmol/L (ref 96–106)
Creatinine, Ser: 1.09 mg/dL (ref 0.76–1.27)
GFR calc Af Amer: 76 mL/min/{1.73_m2} (ref >59)
GFR calc non Af Amer: 66 mL/min/{1.73_m2} (ref >59)
Glucose: 123 mg/dL — ABNORMAL HIGH (ref 65–99)
Potassium: 5 mmol/L (ref 3.5–5.2)
Sodium: 139 mmol/L (ref 134–144)

## 2019-12-30 NOTE — Telephone Encounter (Signed)
-----   Message from Berniece Salines, DO sent at 12/30/2019  8:15 AM EDT ----- Your blood glucose is slightly elevated otherwise normal lab

## 2019-12-30 NOTE — Telephone Encounter (Signed)
The patient has been notified of the result and verbalized understanding.  All questions (if any) were answered. Wilma Flavin, RN 12/30/2019 8:36 AM

## 2020-01-03 ENCOUNTER — Telehealth (HOSPITAL_COMMUNITY): Payer: Self-pay | Admitting: *Deleted

## 2020-01-03 NOTE — Telephone Encounter (Signed)

## 2020-01-04 ENCOUNTER — Other Ambulatory Visit: Payer: Self-pay

## 2020-01-04 ENCOUNTER — Encounter (HOSPITAL_COMMUNITY): Payer: Self-pay

## 2020-01-04 ENCOUNTER — Ambulatory Visit (HOSPITAL_COMMUNITY)
Admission: RE | Admit: 2020-01-04 | Discharge: 2020-01-04 | Disposition: A | Discharge status: Home / Self Care | Payer: Medicare Other | Source: Ambulatory Visit | Attending: Cardiology | Admitting: Cardiology

## 2020-01-04 DIAGNOSIS — R072 Precordial pain: Secondary | ICD-10-CM | POA: Diagnosis present

## 2020-01-04 MED ORDER — NITROGLYCERIN 0.4 MG SL SUBL: 0.4000 mg | SUBLINGUAL_TABLET | Freq: Once | SUBLINGUAL | Status: AC

## 2020-01-04 MED ORDER — IOHEXOL 350 MG/ML SOLN
80.0000 mL | Freq: Once | INTRAVENOUS | Status: AC | PRN
  Administered 2020-01-04: 80 mL via INTRAVENOUS

## 2020-01-04 MED ORDER — NITROGLYCERIN 0.4 MG SL SUBL
SUBLINGUAL_TABLET | SUBLINGUAL | Status: AC
  Administered 2020-01-04: 0.4 mg via SUBLINGUAL
  Filled 2020-01-04: qty 1

## 2020-01-05 ENCOUNTER — Telehealth: Payer: Self-pay

## 2020-01-05 NOTE — Telephone Encounter (Signed)
-----   Message from Richardo Priest, MD sent at 01/05/2020  1:09 PM EDT ----- His cardiac CTA is quite abnormal I like to see him in the office next 1 to 2 weeks to discuss this in further evaluation

## 2020-01-05 NOTE — Telephone Encounter (Signed)
Spoke with patient regarding results and recommendation. Patient is now scheduled to see Dr. Bettina Gavia on 01/18/20 at [place].  Patient verbalizes understanding and is agreeable to plan of care. Advised patient to call back with any issues or concerns.

## 2020-01-18 ENCOUNTER — Encounter: Payer: Self-pay | Admitting: Cardiology

## 2020-01-18 ENCOUNTER — Other Ambulatory Visit: Payer: Self-pay

## 2020-01-18 ENCOUNTER — Ambulatory Visit (INDEPENDENT_AMBULATORY_CARE_PROVIDER_SITE_OTHER): Payer: Medicare Other | Admitting: Cardiology

## 2020-01-18 VITALS — BP 152/78 | HR 90 | Ht 71.0 in | Wt 165.4 lb

## 2020-01-18 DIAGNOSIS — I129 Hypertensive chronic kidney disease with stage 1 through stage 4 chronic kidney disease, or unspecified chronic kidney disease: Secondary | ICD-10-CM | POA: Diagnosis not present

## 2020-01-18 DIAGNOSIS — E785 Hyperlipidemia, unspecified: Secondary | ICD-10-CM | POA: Diagnosis not present

## 2020-01-18 DIAGNOSIS — I714 Abdominal aortic aneurysm, without rupture, unspecified: Secondary | ICD-10-CM

## 2020-01-18 DIAGNOSIS — I480 Paroxysmal atrial fibrillation: Secondary | ICD-10-CM | POA: Diagnosis not present

## 2020-01-18 DIAGNOSIS — I6523 Occlusion and stenosis of bilateral carotid arteries: Secondary | ICD-10-CM

## 2020-01-18 DIAGNOSIS — I6501 Occlusion and stenosis of right vertebral artery: Secondary | ICD-10-CM

## 2020-01-18 DIAGNOSIS — N182 Chronic kidney disease, stage 2 (mild): Secondary | ICD-10-CM

## 2020-01-18 DIAGNOSIS — J449 Chronic obstructive pulmonary disease, unspecified: Secondary | ICD-10-CM

## 2020-01-18 DIAGNOSIS — I25119 Atherosclerotic heart disease of native coronary artery with unspecified angina pectoris: Secondary | ICD-10-CM

## 2020-01-18 NOTE — Patient Instructions (Signed)
Medication Instructions:  Your physician recommends that you continue on your current medications as directed. Please refer to the Current Medication list given to you today.  *If you need a refill on your cardiac medications before your next appointment, please call your pharmacy*   Lab Work: Your physician recommends that you return for lab work in: TODAY CBC, BMP If you have labs (blood work) drawn today and your tests are completely normal, you will receive your results only by: [name] MyChart Message (if you have MyChart) OR . A paper copy in the mail If you have any lab test that is abnormal or we need to change your treatment, we will call you to review the results.   Testing/Procedures:    [place] [address] Dept: [phone] Loc: [place]  01/18/2020  You are scheduled for a Cardiac Catheterization on Tuesday, August 10 with Dr. Glenetta Hew.  1. Please arrive at the [facility] (Main Entrance A) at [facility]: [address] [place], [place] [address] at 5:30 AM (This time is two hours before your procedure to ensure your preparation). Free valet parking service is available.   Special note: Every effort is made to have your procedure done on time. Please understand that emergencies sometimes delay scheduled procedures.  2. Diet: Do not eat solid foods after midnight.  The patient may have clear liquids until 5am upon the day of the procedure.  3. Labs: You will need to have blood drawn on TODAY  4. Medication instructions in preparation for your procedure:   Contrast Allergy: No   On the morning of your procedure, take your Aspirin and any morning medicines NOT listed above.  You may use sips of water.  5. Plan for one night stay--bring personal belongings. 6. Bring a current list of your medications and current insurance  cards. 7. You MUST have a responsible person to drive you home. 8. Someone MUST be with you the first 24 hours after you arrive home or your discharge will be delayed. 9. Please wear clothes that are easy to get on and off and wear slip-on shoes.  Thank you for allowing [place] to care for you!   -- [place] [facility]    Follow-Up: At [facility], you and your health needs are our priority.  As part of our continuing mission to provide you with exceptional heart care, we have created designated Provider Care Teams.  These Care Teams include your primary Cardiologist (physician) and Advanced Practice Providers (APPs -  Physician Assistants and Nurse Practitioners) who all work together to provide you with the care you need, when you need it.  We recommend signing up for the patient portal called "MyChart".  Sign up information is provided on this After Visit Summary.  MyChart is used to connect with patients for Virtual Visits (Telemedicine).  Patients are able to view lab/test results, encounter notes, upcoming appointments, etc.  Non-urgent messages can be sent to your provider as well.   To learn more about what you can do with MyChart, go to [email or web address].    Your next appointment:   6 week(s)  The format for your next appointment:   In Person  Provider:   Shirlee More, MD   Other Instructions

## 2020-01-18 NOTE — H&P (View-Only) (Signed)
Cardiology Office Note:    Date:  01/18/2020   ID:  Corey Tate, Nou Jan 06, 1941, MRN [id number]  PCP:  Imagene Riches, NP  Cardiologist:  Shirlee More, MD    Referring MD: Imagene Riches, NP    ASSESSMENT:    1. Coronary artery disease involving native coronary artery of native heart with angina pectoris ([place])   2. PAF (paroxysmal atrial fibrillation) ([place])   3. 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 Dyslipidemia   5. COPD, severe ([place])   6. AAA (abdominal aortic aneurysm) without rupture ([place])   7. Bilateral carotid artery stenosis   8. Stenosis of right vertebral artery    PLAN:    In order of problems listed above:  1. He underwent cardiac CTA because of his severe exertional shortness of breath and exercise intolerance and diffuse PAD.  He has a very high calcium score and a high risk cardiac CTA with multivessel CAD and after discussion of options benefits and risk he decides undergo coronary angiography.  He will continue his current medical therapy including chronic dual antiplatelet therapy.  Will defer angiography for 1 to 2 weeks with his wife having cataract surgery 2. No recurrence he takes dual antiplatelet therapy he no longer takes rate suppressing medications because of bradycardia and syncope 3. Stable BP at target continue his current antihypertensives 4. And cannot continue his current statin Zetia 5. Managed by his PCP clinically stable 6. Small abdominal aortic aneurysm we will plan to recheck 6 months 7. He has bilateral carotid and vertebral disease make him a poor candidate for elective bypass surgery.  After his coronary angiography I will direct him to see vascular surgery for evaluation and ongoing surveillance   Next appointment: I will see him in 6 weeks   Medication Adjustments/Labs and Tests Ordered: Current medicines are reviewed at length with the patient today.  Concerns regarding medicines are outlined above.   No orders of the defined types were placed in this encounter.  No orders of the defined types were placed in this encounter.   Chief Complaint  Patient presents with  . Follow-up  . PAD    History of Present Illness:    Corey Tate is a 79 y.o. male with a hx of paroxysmal atrial fibrillation last seen 08/24/2018 after syncopal episode associated with a slow ventricular response.  Rate suppressant medication was withdrawn and a follow-up ambulatory heart rhythm monitor showed an adequate heart rate without episodes of bradycardia or pauses.  Other medical problems include severe COPD hypertension hyperlipidemia and peripheral vascular disease  last seen 06/21/202. He was seen 12/12/2019  after having CTA and duplex performed at [facility] and initiated clopidogrel along with aspirin and intensified lipid-lowering treatment adding Zetia to his statin..  I obtained and reviewed the reports and summarized below He had CTA head neck 12/05/2019 which shows severe atherosclerosis aortic arch head and neck right vertebral artery is occluded proximal right internal carotid artery tandem 60 to 65% stenosis stable severe stenosis left vertebral artery and also heavy intracranial calcification bilaterally of the ICA siphon.  Report from 2018 is similar except the right vertebral artery was not occluded at that time.  I do not see any previous evaluation of abdominal aorta in care everywhere. Carotid duplex performed 11/25/2019 showed  less than 50% stenosis on the right 50 to 69% stenosis on the left. He also had CT angio of the abdomen showed a 3.5  centimeter infrarenal abdominal aortic aneurysm bilateral renal artery stenosis and left hydronephrosis. . Compliance with diet, lifestyle and medications: Yes  Underwent cardiac CTA 01/04/2020 showed a calcium score severely elevated 3211 greater than 90 percentile for age and sex and severe multivessel coronary artery disease: I reviewed the images with  my partner Dr.Tobb.  Coronary Arteries:  Normal coronary origin.  Right dominance.  RCA is a large dominant artery that gives rise to PDA and PLVB. There is significant diffuse severe (>70%) calcified plaques through out the RCA. There is a noncalcified lesion right above the bifurcation of the PLVB and PDA which appears to be close to 99% occluded.  Left main is a large artery that gives rise to LAD and LCX arteries.  LAD is a large vessel. The proximal LAD with a severe (>70%) calcified plaque. The Mid to Distal LAD is a long tubular severe (>70%) calcified plaque. There is minimal diffuse calcified plaque in the LAD.  LCX is a non-dominant artery that gives rise to one large OM1 branch. There is a proximal moderate (50-69%) calcified plaque. The mid to distal LCX with severe (>70%) calcified plaque. The distal LCX with moderate (50-69%) calcified plaque.  I brought him to the office today to understand the results of his cardiac CTA and has an extremely elevated calcium score and a high risk CTA with multivessel CAD.  It is difficult to judge symptoms as his predominant problem is low back pain rating down the right lower extremity he also has claudication exertional shortness of breath.  He is not having chest pain.  I presented him the option of intensive medical therapy versus angiography.  He prefers to understand what is wrong.  I think he is a very poor candidate for CABG however he should undergo coronary angiography first to confirm the findings of his cardiac CTA and hopefully would be a candidate for percutaneous intervention.  Options benefits and risks detailed with the patient Past Medical History:  Diagnosis Date  . Anxiety 12/11/2017  . Arthritis   . Asthma    ?  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 Atrial flutter by electrocardiogram ([place]) 12/10/2016  . Bilateral carotid artery stenosis 01/31/2016  . Cancer ([place])    SKIN  CANCERS  . Carotid bruit 12/11/2017  . COPD, severe ([place]) 03/04/2016  . Cough 03/04/2016  . Degenerative arthritis of hip 04/02/2012  . Dyslipidemia 09/26/2015  . Dysrhythmia    "SKIPS"  . GERD (gastroesophageal reflux disease)   . H/O varicella 12/11/2017  . History of atrial fibrillation 12/10/2016  . Hypertension   . Nicotine dependence, uncomplicated 0/07/4095  . Nocturnal hypoxemia 03/04/2016  . Peripheral vascular disease ([place])   . Permanent atrial fibrillation ([place]) 06/29/2017  . Shortness of breath    USES OXYGEN AT NIGHT--HX OF RIGHT LOWER LOBE PULMONARY NODULE--FOLLOWED BY PT'S MEDICAL DOCTOR AND HAS HAD FOR YEARS  . Smoking greater than 40 pack years 03/04/2016  . Syncope 08/04/2016    Past Surgical History:  Procedure Laterality Date  . BACK SURGERY     LOWER BACK SURGERY X 3 - FUSION  . CARPAL TUNNEL RELEASE AND SURGERY LEFT ELBOW    . CATARACT EXTRACTION    . CERVICAL DISC SURGERY     FUSION - ONLY SLIGHT LIMITATION IN NECK MOVEMENT  . POSTERIOR CERVICAL FUSION/FORAMINOTOMY Bilateral 09/30/2012   Procedure: CERVICAL SEVEN AND THORACIC ONE BILATERAL POSTERIOR CERVICAL FUSION/FORAMINOTOMY ;  Surgeon: Eustace Moore, MD;  Location: [facility];  Service: Neurosurgery;  Laterality: Bilateral;  . RIGHT SHOULDER SURGERY    . SINUS SURGERY WITH INSTATRAK    . TOTAL HIP ARTHROPLASTY  04/02/2012   Procedure: TOTAL HIP ARTHROPLASTY ANTERIOR APPROACH;  Surgeon: Mcarthur Rossetti, MD;  Location: [facility];  Service: Orthopedics;  Laterality: Right;  Right Total Hip Arthroplasty  . VASCULAR SURGERY     STENT PLACEMENT RIGHT LEG AND "ROTOR ROOTER" LEFT LEG    Current Medications: Current Meds  Medication Sig  . albuterol (PROVENTIL HFA;VENTOLIN HFA) 108 (90 Base) MCG/ACT inhaler Inhale into the lungs every 6 (six) hours as needed for wheezing or shortness of breath.  [name] amLODipine (NORVASC) 5 MG tablet Take 1 tablet (5 mg total) by mouth daily.  Jearl Klinefelter ELLIPTA 62.5-25 MCG/INH AEPB  Inhale 1 Pump into the lungs daily.  [name] aspirin EC 81 MG tablet Take 1 tablet by mouth daily.  . clopidogrel (PLAVIX) 75 MG tablet Take 1 tablet (75 mg total) by mouth daily.  . cyclobenzaprine (FLEXERIL) 10 MG tablet Take 10 mg by mouth 2 (two) times daily.  [name] docusate sodium (COLACE) 100 MG capsule Take 100 mg by mouth daily.  [name] ezetimibe (ZETIA) 10 MG tablet Take 1 tablet (10 mg total) by mouth daily.  . famotidine (PEPCID) 20 MG tablet Take 20 mg by mouth daily.  [name] ibuprofen (ADVIL,MOTRIN) 100 MG tablet Take 100 mg by mouth every 6 (six) hours as needed for fever.  . Menaquinone-7 (VITAMIN K2 PO) Take 1 tablet by mouth daily.   . Multiple Vitamin (MULTIVITAMIN) tablet Take 1 tablet by mouth daily.  . Omega-3 Fatty Acids (FISH OIL) 1000 MG CAPS Take 1 capsule by mouth daily.  . OXYGEN Inhale 2 L into the lungs as needed.  . pantoprazole (PROTONIX) 40 MG tablet Take 40 mg by mouth daily.  . pravastatin (PRAVACHOL) 40 MG tablet Take 40 mg by mouth daily.   . vitamin B-12 (CYANOCOBALAMIN) 500 MCG tablet Take 500 mcg by mouth daily.   . vitamin C (ASCORBIC ACID) 500 MG tablet Take 500 mg by mouth daily.     Allergies:   Ferra-caps [iron] and Streptomycin   Social History   Socioeconomic History  . Marital status: Married    Spouse name: Not on file  . Number of children: Not on file  . Years of education: Not on file  . Highest education level: Not on file  Occupational History  . Not on file  Tobacco Use  . Smoking status: Current Some Day Smoker    Packs/day: 1.00    Years: 55.00    Pack years: 55.00    Types: Cigarettes  . Smokeless tobacco: Former Systems developer    Types: Secondary school teacher  . Vaping Use: Never used  Substance and Sexual Activity  . Alcohol use: Yes    Alcohol/week: 24.0 standard drinks    Types: 24 Cans of beer per week    Comment: 6 BEERS A DAY  . Drug use: No  . Sexual activity: Not on file  Other Topics Concern  . Not on file  Social History Narrative  .  Not on file   Social Determinants of Health   Financial Resource Strain:   . Difficulty of Paying Living Expenses:   Food Insecurity:   . Worried About Charity fundraiser in the Last Year:   . Arboriculturist in the Last Year:   Transportation Needs:   . Lack  of Transportation (Medical):   [name] Lack of Transportation (Non-Medical):   Physical Activity:   . Days of Exercise per Week:   . Minutes of Exercise per Session:   Stress:   . Feeling of Stress :   Social Connections:   . Frequency of Communication with Friends and Family:   . Frequency of Social Gatherings with Friends and Family:   . Attends Religious Services:   . Active Member of Clubs or Organizations:   . Attends Archivist Meetings:   [name] Marital Status:      Family History: The patient's family history includes Cancer in his father; Diabetes in his brother and sister. ROS:   Please see the history of present illness.    All other systems reviewed and are negative.  EKGs/Labs/Other Studies Reviewed:    The following studies were reviewed today:    Recent Labs: 12/29/2019: BUN 10; Creatinine, Ser 1.09; Potassium 5.0; Sodium 139  Recent Lipid Panel    Component Value Date/Time   CHOL 156 08/24/2018 1510   TRIG 69 08/24/2018 1510   HDL 62 08/24/2018 1510   CHOLHDL 2.5 08/24/2018 1510   LDLCALC 80 08/24/2018 1510    Physical Exam:    VS:  BP (!) 152/78 (BP Location: Left Arm, Patient Position: Sitting, Cuff Size: Normal)   Pulse 90   Ht 5\' 11"  (1.803 m)   Wt 165 lb 6.4 oz (75 kg)   SpO2 93%   BMI 23.07 kg/m     Wt Readings from Last 3 Encounters:  01/18/20 165 lb 6.4 oz (75 kg)  12/12/19 163 lb (73.9 kg)  05/10/19 166 lb 9.6 oz (75.6 kg)     GEN: He appears his age difficulty with hearing he does not look chronically ill or debilitated well nourished, well developed in no acute distress HEENT: Normal NECK: No JVD; No carotid bruits LYMPHATICS: No lymphadenopathy CARDIAC: RRR, no  murmurs, rubs, gallops RESPIRATORY:  Clear to auscultation without rales, wheezing or rhonchi fusilli diminished breath sounds ABDOMEN: Soft, non-tender, non-distended MUSCULOSKELETAL:  No edema; No deformity  SKIN: Warm and dry NEUROLOGIC:  Alert and oriented x 3 PSYCHIATRIC:  Normal affect    Signed, Shirlee More, MD  01/18/2020 10:31 AM    [place]

## 2020-01-18 NOTE — Progress Notes (Signed)
Cardiology Office Note:    Date:  01/18/2020   ID:  Corey Tate, Corey Tate 1940/10/23, MRN [id number]  PCP:  Imagene Riches, NP  Cardiologist:  Shirlee More, MD    Referring MD: Imagene Riches, NP    ASSESSMENT:    1. Coronary artery disease involving native coronary artery of native heart with angina pectoris ([place])   2. PAF (paroxysmal atrial fibrillation) ([place])   3. 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 Dyslipidemia   5. COPD, severe ([place])   6. AAA (abdominal aortic aneurysm) without rupture ([place])   7. Bilateral carotid artery stenosis   8. Stenosis of right vertebral artery    PLAN:    In order of problems listed above:  1. He underwent cardiac CTA because of his severe exertional shortness of breath and exercise intolerance and diffuse PAD.  He has a very high calcium score and a high risk cardiac CTA with multivessel CAD and after discussion of options benefits and risk he decides undergo coronary angiography.  He will continue his current medical therapy including chronic dual antiplatelet therapy.  Will defer angiography for 1 to 2 weeks with his wife having cataract surgery 2. No recurrence he takes dual antiplatelet therapy he no longer takes rate suppressing medications because of bradycardia and syncope 3. Stable BP at target continue his current antihypertensives 4. And cannot continue his current statin Zetia 5. Managed by his PCP clinically stable 6. Small abdominal aortic aneurysm we will plan to recheck 6 months 7. He has bilateral carotid and vertebral disease make him a poor candidate for elective bypass surgery.  After his coronary angiography I will direct him to see vascular surgery for evaluation and ongoing surveillance   Next appointment: I will see him in 6 weeks   Medication Adjustments/Labs and Tests Ordered: Current medicines are reviewed at length with the patient today.  Concerns regarding medicines are outlined above.   No orders of the defined types were placed in this encounter.  No orders of the defined types were placed in this encounter.   Chief Complaint  Patient presents with  . Follow-up  . PAD    History of Present Illness:    Corey Tate is a 79 y.o. male with a hx of paroxysmal atrial fibrillation last seen 08/24/2018 after syncopal episode associated with a slow ventricular response.  Rate suppressant medication was withdrawn and a follow-up ambulatory heart rhythm monitor showed an adequate heart rate without episodes of bradycardia or pauses.  Other medical problems include severe COPD hypertension hyperlipidemia and peripheral vascular disease  last seen 06/21/202. He was seen 12/12/2019  after having CTA and duplex performed at [facility] and initiated clopidogrel along with aspirin and intensified lipid-lowering treatment adding Zetia to his statin..  I obtained and reviewed the reports and summarized below He had CTA head neck 12/05/2019 which shows severe atherosclerosis aortic arch head and neck right vertebral artery is occluded proximal right internal carotid artery tandem 60 to 65% stenosis stable severe stenosis left vertebral artery and also heavy intracranial calcification bilaterally of the ICA siphon.  Report from 2018 is similar except the right vertebral artery was not occluded at that time.  I do not see any previous evaluation of abdominal aorta in care everywhere. Carotid duplex performed 11/25/2019 showed  less than 50% stenosis on the right 50 to 69% stenosis on the left. He also had CT angio of the abdomen showed a 3.5  centimeter infrarenal abdominal aortic aneurysm bilateral renal artery stenosis and left hydronephrosis. . Compliance with diet, lifestyle and medications: Yes  Underwent cardiac CTA 01/04/2020 showed a calcium score severely elevated 3211 greater than 90 percentile for age and sex and severe multivessel coronary artery disease: I reviewed the images with  my partner Dr.Tobb.  Coronary Arteries:  Normal coronary origin.  Right dominance.  RCA is a large dominant artery that gives rise to PDA and PLVB. There is significant diffuse severe (>70%) calcified plaques through out the RCA. There is a noncalcified lesion right above the bifurcation of the PLVB and PDA which appears to be close to 99% occluded.  Left main is a large artery that gives rise to LAD and LCX arteries.  LAD is a large vessel. The proximal LAD with a severe (>70%) calcified plaque. The Mid to Distal LAD is a long tubular severe (>70%) calcified plaque. There is minimal diffuse calcified plaque in the LAD.  LCX is a non-dominant artery that gives rise to one large OM1 branch. There is a proximal moderate (50-69%) calcified plaque. The mid to distal LCX with severe (>70%) calcified plaque. The distal LCX with moderate (50-69%) calcified plaque.  I brought him to the office today to understand the results of his cardiac CTA and has an extremely elevated calcium score and a high risk CTA with multivessel CAD.  It is difficult to judge symptoms as his predominant problem is low back pain rating down the right lower extremity he also has claudication exertional shortness of breath.  He is not having chest pain.  I presented him the option of intensive medical therapy versus angiography.  He prefers to understand what is wrong.  I think he is a very poor candidate for CABG however he should undergo coronary angiography first to confirm the findings of his cardiac CTA and hopefully would be a candidate for percutaneous intervention.  Options benefits and risks detailed with the patient Past Medical History:  Diagnosis Date  . Anxiety 12/11/2017  . Arthritis   . Asthma    ?  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 Atrial flutter by electrocardiogram ([place]) 12/10/2016  . Bilateral carotid artery stenosis 01/31/2016  . Cancer ([place])    SKIN  CANCERS  . Carotid bruit 12/11/2017  . COPD, severe ([place]) 03/04/2016  . Cough 03/04/2016  . Degenerative arthritis of hip 04/02/2012  . Dyslipidemia 09/26/2015  . Dysrhythmia    "SKIPS"  . GERD (gastroesophageal reflux disease)   . H/O varicella 12/11/2017  . History of atrial fibrillation 12/10/2016  . Hypertension   . Nicotine dependence, uncomplicated 07/28/3662  . Nocturnal hypoxemia 03/04/2016  . Peripheral vascular disease ([place])   . Permanent atrial fibrillation ([place]) 06/29/2017  . Shortness of breath    USES OXYGEN AT NIGHT--HX OF RIGHT LOWER LOBE PULMONARY NODULE--FOLLOWED BY PT'S MEDICAL DOCTOR AND HAS HAD FOR YEARS  . Smoking greater than 40 pack years 03/04/2016  . Syncope 08/04/2016    Past Surgical History:  Procedure Laterality Date  . BACK SURGERY     LOWER BACK SURGERY X 3 - FUSION  . CARPAL TUNNEL RELEASE AND SURGERY LEFT ELBOW    . CATARACT EXTRACTION    . CERVICAL DISC SURGERY     FUSION - ONLY SLIGHT LIMITATION IN NECK MOVEMENT  . POSTERIOR CERVICAL FUSION/FORAMINOTOMY Bilateral 09/30/2012   Procedure: CERVICAL SEVEN AND THORACIC ONE BILATERAL POSTERIOR CERVICAL FUSION/FORAMINOTOMY ;  Surgeon: Eustace Moore, MD;  Location: [facility];  Service: Neurosurgery;  Laterality: Bilateral;  . RIGHT SHOULDER SURGERY    . SINUS SURGERY WITH INSTATRAK    . TOTAL HIP ARTHROPLASTY  04/02/2012   Procedure: TOTAL HIP ARTHROPLASTY ANTERIOR APPROACH;  Surgeon: Mcarthur Rossetti, MD;  Location: [facility];  Service: Orthopedics;  Laterality: Right;  Right Total Hip Arthroplasty  . VASCULAR SURGERY     STENT PLACEMENT RIGHT LEG AND "ROTOR ROOTER" LEFT LEG    Current Medications: Current Meds  Medication Sig  . albuterol (PROVENTIL HFA;VENTOLIN HFA) 108 (90 Base) MCG/ACT inhaler Inhale into the lungs every 6 (six) hours as needed for wheezing or shortness of breath.  [name] amLODipine (NORVASC) 5 MG tablet Take 1 tablet (5 mg total) by mouth daily.  Jearl Klinefelter ELLIPTA 62.5-25 MCG/INH AEPB  Inhale 1 Pump into the lungs daily.  [name] aspirin EC 81 MG tablet Take 1 tablet by mouth daily.  . clopidogrel (PLAVIX) 75 MG tablet Take 1 tablet (75 mg total) by mouth daily.  . cyclobenzaprine (FLEXERIL) 10 MG tablet Take 10 mg by mouth 2 (two) times daily.  [name] docusate sodium (COLACE) 100 MG capsule Take 100 mg by mouth daily.  [name] ezetimibe (ZETIA) 10 MG tablet Take 1 tablet (10 mg total) by mouth daily.  . famotidine (PEPCID) 20 MG tablet Take 20 mg by mouth daily.  [name] ibuprofen (ADVIL,MOTRIN) 100 MG tablet Take 100 mg by mouth every 6 (six) hours as needed for fever.  . Menaquinone-7 (VITAMIN K2 PO) Take 1 tablet by mouth daily.   . Multiple Vitamin (MULTIVITAMIN) tablet Take 1 tablet by mouth daily.  . Omega-3 Fatty Acids (FISH OIL) 1000 MG CAPS Take 1 capsule by mouth daily.  . OXYGEN Inhale 2 L into the lungs as needed.  . pantoprazole (PROTONIX) 40 MG tablet Take 40 mg by mouth daily.  . pravastatin (PRAVACHOL) 40 MG tablet Take 40 mg by mouth daily.   . vitamin B-12 (CYANOCOBALAMIN) 500 MCG tablet Take 500 mcg by mouth daily.   . vitamin C (ASCORBIC ACID) 500 MG tablet Take 500 mg by mouth daily.     Allergies:   Ferra-caps [iron] and Streptomycin   Social History   Socioeconomic History  . Marital status: Married    Spouse name: Not on file  . Number of children: Not on file  . Years of education: Not on file  . Highest education level: Not on file  Occupational History  . Not on file  Tobacco Use  . Smoking status: Current Some Day Smoker    Packs/day: 1.00    Years: 55.00    Pack years: 55.00    Types: Cigarettes  . Smokeless tobacco: Former Systems developer    Types: Secondary school teacher  . Vaping Use: Never used  Substance and Sexual Activity  . Alcohol use: Yes    Alcohol/week: 24.0 standard drinks    Types: 24 Cans of beer per week    Comment: 6 BEERS A DAY  . Drug use: No  . Sexual activity: Not on file  Other Topics Concern  . Not on file  Social History Narrative  .  Not on file   Social Determinants of Health   Financial Resource Strain:   . Difficulty of Paying Living Expenses:   Food Insecurity:   . Worried About Charity fundraiser in the Last Year:   . Arboriculturist in the Last Year:   Transportation Needs:   . Lack  of Transportation (Medical):   [name] Lack of Transportation (Non-Medical):   Physical Activity:   . Days of Exercise per Week:   . Minutes of Exercise per Session:   Stress:   . Feeling of Stress :   Social Connections:   . Frequency of Communication with Friends and Family:   . Frequency of Social Gatherings with Friends and Family:   . Attends Religious Services:   . Active Member of Clubs or Organizations:   . Attends Archivist Meetings:   [name] Marital Status:      Family History: The patient's family history includes Cancer in his father; Diabetes in his brother and sister. ROS:   Please see the history of present illness.    All other systems reviewed and are negative.  EKGs/Labs/Other Studies Reviewed:    The following studies were reviewed today:    Recent Labs: 12/29/2019: BUN 10; Creatinine, Ser 1.09; Potassium 5.0; Sodium 139  Recent Lipid Panel    Component Value Date/Time   CHOL 156 08/24/2018 1510   TRIG 69 08/24/2018 1510   HDL 62 08/24/2018 1510   CHOLHDL 2.5 08/24/2018 1510   LDLCALC 80 08/24/2018 1510    Physical Exam:    VS:  BP (!) 152/78 (BP Location: Left Arm, Patient Position: Sitting, Cuff Size: Normal)   Pulse 90   Ht 5\' 11"  (1.803 m)   Wt 165 lb 6.4 oz (75 kg)   SpO2 93%   BMI 23.07 kg/m     Wt Readings from Last 3 Encounters:  01/18/20 165 lb 6.4 oz (75 kg)  12/12/19 163 lb (73.9 kg)  05/10/19 166 lb 9.6 oz (75.6 kg)     GEN: He appears his age difficulty with hearing he does not look chronically ill or debilitated well nourished, well developed in no acute distress HEENT: Normal NECK: No JVD; No carotid bruits LYMPHATICS: No lymphadenopathy CARDIAC: RRR, no  murmurs, rubs, gallops RESPIRATORY:  Clear to auscultation without rales, wheezing or rhonchi fusilli diminished breath sounds ABDOMEN: Soft, non-tender, non-distended MUSCULOSKELETAL:  No edema; No deformity  SKIN: Warm and dry NEUROLOGIC:  Alert and oriented x 3 PSYCHIATRIC:  Normal affect    Signed, Shirlee More, MD  01/18/2020 10:31 AM    [place]

## 2020-01-18 NOTE — H&P (View-Only) (Signed)
Cardiology Office Note:    Date:  01/18/2020   ID:  Corey Tate, Corey Tate 1940/07/28, MRN [id number]  PCP:  Imagene Riches, NP  Cardiologist:  Shirlee More, MD    Referring MD: Imagene Riches, NP    ASSESSMENT:    1. Coronary artery disease involving native coronary artery of native heart with angina pectoris ([place])   2. PAF (paroxysmal atrial fibrillation) ([place])   3. 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 Dyslipidemia   5. COPD, severe ([place])   6. AAA (abdominal aortic aneurysm) without rupture ([place])   7. Bilateral carotid artery stenosis   8. Stenosis of right vertebral artery    PLAN:    In order of problems listed above:  1. He underwent cardiac CTA because of his severe exertional shortness of breath and exercise intolerance and diffuse PAD.  He has a very high calcium score and a high risk cardiac CTA with multivessel CAD and after discussion of options benefits and risk he decides undergo coronary angiography.  He will continue his current medical therapy including chronic dual antiplatelet therapy.  Will defer angiography for 1 to 2 weeks with his wife having cataract surgery 2. No recurrence he takes dual antiplatelet therapy he no longer takes rate suppressing medications because of bradycardia and syncope 3. Stable BP at target continue his current antihypertensives 4. And cannot continue his current statin Zetia 5. Managed by his PCP clinically stable 6. Small abdominal aortic aneurysm we will plan to recheck 6 months 7. He has bilateral carotid and vertebral disease make him a poor candidate for elective bypass surgery.  After his coronary angiography I will direct him to see vascular surgery for evaluation and ongoing surveillance   Next appointment: I will see him in 6 weeks   Medication Adjustments/Labs and Tests Ordered: Current medicines are reviewed at length with the patient today.  Concerns regarding medicines are outlined above.   No orders of the defined types were placed in this encounter.  No orders of the defined types were placed in this encounter.   Chief Complaint  Patient presents with  . Follow-up  . PAD    History of Present Illness:    Corey Tate is a 79 y.o. male with a hx of paroxysmal atrial fibrillation last seen 08/24/2018 after syncopal episode associated with a slow ventricular response.  Rate suppressant medication was withdrawn and a follow-up ambulatory heart rhythm monitor showed an adequate heart rate without episodes of bradycardia or pauses.  Other medical problems include severe COPD hypertension hyperlipidemia and peripheral vascular disease  last seen 06/21/202. He was seen 12/12/2019  after having CTA and duplex performed at [facility] and initiated clopidogrel along with aspirin and intensified lipid-lowering treatment adding Zetia to his statin..  I obtained and reviewed the reports and summarized below He had CTA head neck 12/05/2019 which shows severe atherosclerosis aortic arch head and neck right vertebral artery is occluded proximal right internal carotid artery tandem 60 to 65% stenosis stable severe stenosis left vertebral artery and also heavy intracranial calcification bilaterally of the ICA siphon.  Report from 2018 is similar except the right vertebral artery was not occluded at that time.  I do not see any previous evaluation of abdominal aorta in care everywhere. Carotid duplex performed 11/25/2019 showed  less than 50% stenosis on the right 50 to 69% stenosis on the left. He also had CT angio of the abdomen showed a 3.5  centimeter infrarenal abdominal aortic aneurysm bilateral renal artery stenosis and left hydronephrosis. . Compliance with diet, lifestyle and medications: Yes  Underwent cardiac CTA 01/04/2020 showed a calcium score severely elevated 3211 greater than 90 percentile for age and sex and severe multivessel coronary artery disease: I reviewed the images with  my partner Dr.Tobb.  Coronary Arteries:  Normal coronary origin.  Right dominance.  RCA is a large dominant artery that gives rise to PDA and PLVB. There is significant diffuse severe (>70%) calcified plaques through out the RCA. There is a noncalcified lesion right above the bifurcation of the PLVB and PDA which appears to be close to 99% occluded.  Left main is a large artery that gives rise to LAD and LCX arteries.  LAD is a large vessel. The proximal LAD with a severe (>70%) calcified plaque. The Mid to Distal LAD is a long tubular severe (>70%) calcified plaque. There is minimal diffuse calcified plaque in the LAD.  LCX is a non-dominant artery that gives rise to one large OM1 branch. There is a proximal moderate (50-69%) calcified plaque. The mid to distal LCX with severe (>70%) calcified plaque. The distal LCX with moderate (50-69%) calcified plaque.  I brought him to the office today to understand the results of his cardiac CTA and has an extremely elevated calcium score and a high risk CTA with multivessel CAD.  It is difficult to judge symptoms as his predominant problem is low back pain rating down the right lower extremity he also has claudication exertional shortness of breath.  He is not having chest pain.  I presented him the option of intensive medical therapy versus angiography.  He prefers to understand what is wrong.  I think he is a very poor candidate for CABG however he should undergo coronary angiography first to confirm the findings of his cardiac CTA and hopefully would be a candidate for percutaneous intervention.  Options benefits and risks detailed with the patient Past Medical History:  Diagnosis Date  . Anxiety 12/11/2017  . Arthritis   . Asthma    ?  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 Atrial flutter by electrocardiogram ([place]) 12/10/2016  . Bilateral carotid artery stenosis 01/31/2016  . Cancer ([place])    SKIN  CANCERS  . Carotid bruit 12/11/2017  . COPD, severe ([place]) 03/04/2016  . Cough 03/04/2016  . Degenerative arthritis of hip 04/02/2012  . Dyslipidemia 09/26/2015  . Dysrhythmia    "SKIPS"  . GERD (gastroesophageal reflux disease)   . H/O varicella 12/11/2017  . History of atrial fibrillation 12/10/2016  . Hypertension   . Nicotine dependence, uncomplicated 10/27/3147  . Nocturnal hypoxemia 03/04/2016  . Peripheral vascular disease ([place])   . Permanent atrial fibrillation ([place]) 06/29/2017  . Shortness of breath    USES OXYGEN AT NIGHT--HX OF RIGHT LOWER LOBE PULMONARY NODULE--FOLLOWED BY PT'S MEDICAL DOCTOR AND HAS HAD FOR YEARS  . Smoking greater than 40 pack years 03/04/2016  . Syncope 08/04/2016    Past Surgical History:  Procedure Laterality Date  . BACK SURGERY     LOWER BACK SURGERY X 3 - FUSION  . CARPAL TUNNEL RELEASE AND SURGERY LEFT ELBOW    . CATARACT EXTRACTION    . CERVICAL DISC SURGERY     FUSION - ONLY SLIGHT LIMITATION IN NECK MOVEMENT  . POSTERIOR CERVICAL FUSION/FORAMINOTOMY Bilateral 09/30/2012   Procedure: CERVICAL SEVEN AND THORACIC ONE BILATERAL POSTERIOR CERVICAL FUSION/FORAMINOTOMY ;  Surgeon: Eustace Moore, MD;  Location: [facility] [place];  Service: Neurosurgery;  Laterality: Bilateral;  . RIGHT SHOULDER SURGERY    . SINUS SURGERY WITH INSTATRAK    . TOTAL HIP ARTHROPLASTY  04/02/2012   Procedure: TOTAL HIP ARTHROPLASTY ANTERIOR APPROACH;  Surgeon: Mcarthur Rossetti, MD;  Location: [facility];  Service: Orthopedics;  Laterality: Right;  Right Total Hip Arthroplasty  . VASCULAR SURGERY     STENT PLACEMENT RIGHT LEG AND "ROTOR ROOTER" LEFT LEG    Current Medications: Current Meds  Medication Sig  . albuterol (PROVENTIL HFA;VENTOLIN HFA) 108 (90 Base) MCG/ACT inhaler Inhale into the lungs every 6 (six) hours as needed for wheezing or shortness of breath.  [name] amLODipine (NORVASC) 5 MG tablet Take 1 tablet (5 mg total) by mouth daily.  Jearl Klinefelter ELLIPTA 62.5-25 MCG/INH AEPB  Inhale 1 Pump into the lungs daily.  [name] aspirin EC 81 MG tablet Take 1 tablet by mouth daily.  . clopidogrel (PLAVIX) 75 MG tablet Take 1 tablet (75 mg total) by mouth daily.  . cyclobenzaprine (FLEXERIL) 10 MG tablet Take 10 mg by mouth 2 (two) times daily.  [name] docusate sodium (COLACE) 100 MG capsule Take 100 mg by mouth daily.  [name] ezetimibe (ZETIA) 10 MG tablet Take 1 tablet (10 mg total) by mouth daily.  . famotidine (PEPCID) 20 MG tablet Take 20 mg by mouth daily.  [name] ibuprofen (ADVIL,MOTRIN) 100 MG tablet Take 100 mg by mouth every 6 (six) hours as needed for fever.  . Menaquinone-7 (VITAMIN K2 PO) Take 1 tablet by mouth daily.   . Multiple Vitamin (MULTIVITAMIN) tablet Take 1 tablet by mouth daily.  . Omega-3 Fatty Acids (FISH OIL) 1000 MG CAPS Take 1 capsule by mouth daily.  . OXYGEN Inhale 2 L into the lungs as needed.  . pantoprazole (PROTONIX) 40 MG tablet Take 40 mg by mouth daily.  . pravastatin (PRAVACHOL) 40 MG tablet Take 40 mg by mouth daily.   . vitamin B-12 (CYANOCOBALAMIN) 500 MCG tablet Take 500 mcg by mouth daily.   . vitamin C (ASCORBIC ACID) 500 MG tablet Take 500 mg by mouth daily.     Allergies:   Ferra-caps [iron] and Streptomycin   Social History   Socioeconomic History  . Marital status: Married    Spouse name: Not on file  . Number of children: Not on file  . Years of education: Not on file  . Highest education level: Not on file  Occupational History  . Not on file  Tobacco Use  . Smoking status: Current Some Day Smoker    Packs/day: 1.00    Years: 55.00    Pack years: 55.00    Types: Cigarettes  . Smokeless tobacco: Former Systems developer    Types: Secondary school teacher  . Vaping Use: Never used  Substance and Sexual Activity  . Alcohol use: Yes    Alcohol/week: 24.0 standard drinks    Types: 24 Cans of beer per week    Comment: 6 BEERS A DAY  . Drug use: No  . Sexual activity: Not on file  Other Topics Concern  . Not on file  Social History Narrative  .  Not on file   Social Determinants of Health   Financial Resource Strain:   . Difficulty of Paying Living Expenses:   Food Insecurity:   . Worried About Charity fundraiser in the Last Year:   . Arboriculturist in the Last Year:   Transportation Needs:   . Lack  of Transportation (Medical):   [name] Lack of Transportation (Non-Medical):   Physical Activity:   . Days of Exercise per Week:   . Minutes of Exercise per Session:   Stress:   . Feeling of Stress :   Social Connections:   . Frequency of Communication with Friends and Family:   . Frequency of Social Gatherings with Friends and Family:   . Attends Religious Services:   . Active Member of Clubs or Organizations:   . Attends Archivist Meetings:   [name] Marital Status:      Family History: The patient's family history includes Cancer in his father; Diabetes in his brother and sister. ROS:   Please see the history of present illness.    All other systems reviewed and are negative.  EKGs/Labs/Other Studies Reviewed:    The following studies were reviewed today:    Recent Labs: 12/29/2019: BUN 10; Creatinine, Ser 1.09; Potassium 5.0; Sodium 139  Recent Lipid Panel    Component Value Date/Time   CHOL 156 08/24/2018 1510   TRIG 69 08/24/2018 1510   HDL 62 08/24/2018 1510   CHOLHDL 2.5 08/24/2018 1510   LDLCALC 80 08/24/2018 1510    Physical Exam:    VS:  BP (!) 152/78 (BP Location: Left Arm, Patient Position: Sitting, Cuff Size: Normal)   Pulse 90   Ht 5\' 11"  (1.803 m)   Wt 165 lb 6.4 oz (75 kg)   SpO2 93%   BMI 23.07 kg/m     Wt Readings from Last 3 Encounters:  01/18/20 165 lb 6.4 oz (75 kg)  12/12/19 163 lb (73.9 kg)  05/10/19 166 lb 9.6 oz (75.6 kg)     GEN: He appears his age difficulty with hearing he does not look chronically ill or debilitated well nourished, well developed in no acute distress HEENT: Normal NECK: No JVD; No carotid bruits LYMPHATICS: No lymphadenopathy CARDIAC: RRR, no  murmurs, rubs, gallops RESPIRATORY:  Clear to auscultation without rales, wheezing or rhonchi fusilli diminished breath sounds ABDOMEN: Soft, non-tender, non-distended MUSCULOSKELETAL:  No edema; No deformity  SKIN: Warm and dry NEUROLOGIC:  Alert and oriented x 3 PSYCHIATRIC:  Normal affect    Signed, Shirlee More, MD  01/18/2020 10:31 AM    [place]

## 2020-01-19 ENCOUNTER — Telehealth: Payer: Self-pay

## 2020-01-19 LAB — BASIC METABOLIC PANEL
BUN/Creatinine Ratio: 10 (ref 10–24)
BUN: 13 mg/dL (ref 8–27)
CO2: 22 mmol/L (ref 20–29)
Calcium: 10 mg/dL (ref 8.6–10.2)
Chloride: 96 mmol/L (ref 96–106)
Creatinine, Ser: 1.36 mg/dL — ABNORMAL HIGH (ref 0.76–1.27)
GFR calc Af Amer: 58 mL/min/{1.73_m2} — ABNORMAL LOW (ref >59)
GFR calc non Af Amer: 50 mL/min/{1.73_m2} — ABNORMAL LOW (ref >59)
Glucose: 119 mg/dL — ABNORMAL HIGH (ref 65–99)
Potassium: 4.9 mmol/L (ref 3.5–5.2)
Sodium: 133 mmol/L — ABNORMAL LOW (ref 134–144)

## 2020-01-19 LAB — CBC
Hematocrit: 37.3 % — ABNORMAL LOW (ref 37.5–51.0)
Hemoglobin: 13.6 g/dL (ref 13.0–17.7)
MCH: 35.6 pg — ABNORMAL HIGH (ref 26.6–33.0)
MCHC: 36.5 g/dL — ABNORMAL HIGH (ref 31.5–35.7)
MCV: 98 fL — ABNORMAL HIGH (ref 79–97)
Platelets: 182 10*3/uL (ref 150–450)
RBC: 3.82 x10E6/uL — ABNORMAL LOW (ref 4.14–5.80)
RDW: 12.4 % (ref 11.6–15.4)
WBC: 8.2 10*3/uL (ref 3.4–10.8)

## 2020-01-19 NOTE — Telephone Encounter (Signed)
-----   Message from Richardo Priest, MD sent at 01/19/2020 11:58 AM EDT ----- Normal or stable result  No changes

## 2020-01-19 NOTE — Telephone Encounter (Signed)
Spoke with patient regarding results and recommendation.  Patient verbalizes understanding and is agreeable to plan of care. Advised patient to call back with any issues or concerns.  

## 2020-01-25 ENCOUNTER — Ambulatory Visit: Payer: Medicare Other | Admitting: Cardiology

## 2020-01-27 ENCOUNTER — Other Ambulatory Visit (HOSPITAL_COMMUNITY)
Admission: RE | Admit: 2020-01-27 | Discharge: 2020-01-27 | Disposition: A | Discharge status: Home / Self Care | Payer: Medicare Other | Source: Ambulatory Visit | Attending: Cardiology | Admitting: Cardiology

## 2020-01-27 DIAGNOSIS — Z20822 Contact with and (suspected) exposure to covid-19: Secondary | ICD-10-CM | POA: Insufficient documentation

## 2020-01-27 DIAGNOSIS — Z01812 Encounter for preprocedural laboratory examination: Secondary | ICD-10-CM | POA: Insufficient documentation

## 2020-01-27 LAB — SARS CORONAVIRUS 2 (TAT 6-24 HRS): SARS Coronavirus 2: NEGATIVE

## 2020-01-30 ENCOUNTER — Telehealth: Payer: Self-pay | Admitting: *Deleted

## 2020-01-30 NOTE — Telephone Encounter (Signed)
Pt contacted pre-catheterization scheduled at [facility] for: Tuesday January 31, 2020 7:30 AM Verified arrival time and place: [facility]) at: 5:30 AM   No solid food after midnight prior to cath, clear liquids until 5 AM day of procedure.  Hold: Ibuprofen-until post procedure-GFR   AM meds can be  taken pre-cath with sips of water including: Plavix 75 mg ASA 81 mg   Confirmed patient has responsible adult to drive home post procedure and observe 24 hours after arriving home: yes  You are allowed ONE visitor in the waiting room during your procedure. Both you and your visitor must wear a mask once you enter the hospital.      COVID-19 Pre-Screening Questions:   In the past 14 days have you had a new cough associated with shortness of breath, unexplained body aches, fever (100.4 or greater) or sudden loss of taste or sense of smell? no  In the past 14 days have you been around anyone with known Covid 19? no  Have you been vaccinated for COVID-19? no    Reviewed procedure/mask/visitor instructions, COVID questions with patient's wife (DPR), Rod Holler.

## 2020-01-31 ENCOUNTER — Other Ambulatory Visit: Payer: Self-pay

## 2020-01-31 ENCOUNTER — Encounter (HOSPITAL_COMMUNITY)
Admission: RE | Disposition: A | Discharge status: Home / Self Care | Payer: Self-pay | Source: Home / Self Care | Attending: Cardiology

## 2020-01-31 ENCOUNTER — Encounter (HOSPITAL_COMMUNITY): Payer: Self-pay | Admitting: Cardiology

## 2020-01-31 ENCOUNTER — Ambulatory Visit (HOSPITAL_COMMUNITY)
Admission: RE | Admit: 2020-01-31 | Discharge: 2020-02-01 | Disposition: A | Discharge status: Home / Self Care | Payer: Medicare Other | Attending: Cardiology | Admitting: Cardiology

## 2020-01-31 DIAGNOSIS — M1610 Unilateral primary osteoarthritis, unspecified hip: Secondary | ICD-10-CM | POA: Diagnosis not present

## 2020-01-31 DIAGNOSIS — I129 Hypertensive chronic kidney disease with stage 1 through stage 4 chronic kidney disease, or unspecified chronic kidney disease: Secondary | ICD-10-CM | POA: Diagnosis not present

## 2020-01-31 DIAGNOSIS — I4892 Unspecified atrial flutter: Secondary | ICD-10-CM | POA: Insufficient documentation

## 2020-01-31 DIAGNOSIS — I25118 Atherosclerotic heart disease of native coronary artery with other forms of angina pectoris: Secondary | ICD-10-CM | POA: Insufficient documentation

## 2020-01-31 DIAGNOSIS — Z7902 Long term (current) use of antithrombotics/antiplatelets: Secondary | ICD-10-CM | POA: Insufficient documentation

## 2020-01-31 DIAGNOSIS — K219 Gastro-esophageal reflux disease without esophagitis: Secondary | ICD-10-CM | POA: Insufficient documentation

## 2020-01-31 DIAGNOSIS — Z79899 Other long term (current) drug therapy: Secondary | ICD-10-CM | POA: Diagnosis not present

## 2020-01-31 DIAGNOSIS — Z7982 Long term (current) use of aspirin: Secondary | ICD-10-CM | POA: Diagnosis not present

## 2020-01-31 DIAGNOSIS — I739 Peripheral vascular disease, unspecified: Secondary | ICD-10-CM | POA: Diagnosis not present

## 2020-01-31 DIAGNOSIS — J449 Chronic obstructive pulmonary disease, unspecified: Secondary | ICD-10-CM | POA: Diagnosis not present

## 2020-01-31 DIAGNOSIS — I2584 Coronary atherosclerosis due to calcified coronary lesion: Secondary | ICD-10-CM | POA: Insufficient documentation

## 2020-01-31 DIAGNOSIS — E785 Hyperlipidemia, unspecified: Secondary | ICD-10-CM | POA: Diagnosis not present

## 2020-01-31 DIAGNOSIS — Z85828 Personal history of other malignant neoplasm of skin: Secondary | ICD-10-CM | POA: Diagnosis not present

## 2020-01-31 DIAGNOSIS — Z96641 Presence of right artificial hip joint: Secondary | ICD-10-CM | POA: Insufficient documentation

## 2020-01-31 DIAGNOSIS — N182 Chronic kidney disease, stage 2 (mild): Secondary | ICD-10-CM | POA: Diagnosis not present

## 2020-01-31 DIAGNOSIS — I4821 Permanent atrial fibrillation: Secondary | ICD-10-CM | POA: Insufficient documentation

## 2020-01-31 DIAGNOSIS — R0600 Dyspnea, unspecified: Secondary | ICD-10-CM | POA: Diagnosis not present

## 2020-01-31 DIAGNOSIS — R931 Abnormal findings on diagnostic imaging of heart and coronary circulation: Secondary | ICD-10-CM | POA: Diagnosis present

## 2020-01-31 DIAGNOSIS — I6523 Occlusion and stenosis of bilateral carotid arteries: Secondary | ICD-10-CM | POA: Insufficient documentation

## 2020-01-31 DIAGNOSIS — Z888 Allergy status to other drugs, medicaments and biological substances status: Secondary | ICD-10-CM | POA: Diagnosis not present

## 2020-01-31 DIAGNOSIS — Z9981 Dependence on supplemental oxygen: Secondary | ICD-10-CM | POA: Insufficient documentation

## 2020-01-31 DIAGNOSIS — I25119 Atherosclerotic heart disease of native coronary artery with unspecified angina pectoris: Secondary | ICD-10-CM | POA: Diagnosis present

## 2020-01-31 DIAGNOSIS — R0609 Other forms of dyspnea: Secondary | ICD-10-CM | POA: Diagnosis present

## 2020-01-31 DIAGNOSIS — I6501 Occlusion and stenosis of right vertebral artery: Secondary | ICD-10-CM | POA: Diagnosis not present

## 2020-01-31 DIAGNOSIS — F17210 Nicotine dependence, cigarettes, uncomplicated: Secondary | ICD-10-CM | POA: Insufficient documentation

## 2020-01-31 DIAGNOSIS — Z809 Family history of malignant neoplasm, unspecified: Secondary | ICD-10-CM | POA: Insufficient documentation

## 2020-01-31 DIAGNOSIS — Z955 Presence of coronary angioplasty implant and graft: Secondary | ICD-10-CM

## 2020-01-31 DIAGNOSIS — I714 Abdominal aortic aneurysm, without rupture: Secondary | ICD-10-CM | POA: Diagnosis not present

## 2020-01-31 HISTORY — PX: CORONARY PRESSURE/FFR STUDY: CATH118243

## 2020-01-31 HISTORY — PX: CORONARY STENT INTERVENTION: CATH118234

## 2020-01-31 HISTORY — PX: LEFT HEART CATH AND CORONARY ANGIOGRAPHY: CATH118249

## 2020-01-31 HISTORY — DX: Dyspnea, unspecified: R06.00

## 2020-01-31 HISTORY — DX: Atherosclerotic heart disease of native coronary artery with unspecified angina pectoris: I25.119

## 2020-01-31 HISTORY — PX: CORONARY ATHERECTOMY: CATH118238

## 2020-01-31 HISTORY — PX: ATHERECTOMY: SHX47

## 2020-01-31 HISTORY — DX: Other forms of dyspnea: R06.09

## 2020-01-31 HISTORY — PX: INTRAVASCULAR PRESSURE WIRE/FFR STUDY: CATH118243

## 2020-01-31 HISTORY — DX: Abnormal findings on diagnostic imaging of heart and coronary circulation: R93.1

## 2020-01-31 SURGERY — LEFT HEART CATH AND CORONARY ANGIOGRAPHY
Anesthesia: LOCAL

## 2020-01-31 MED ORDER — FENTANYL CITRATE (PF) 100 MCG/2ML IJ SOLN
INTRAMUSCULAR | Status: DC | PRN
  Administered 2020-01-31 (×4): 25 ug via INTRAVENOUS

## 2020-01-31 MED ORDER — CLOPIDOGREL BISULFATE 75 MG PO TABS
75.0000 mg | ORAL_TABLET | Freq: Every day | ORAL | Status: DC
  Administered 2020-02-01: 75 mg via ORAL
  Filled 2020-01-31: qty 1

## 2020-01-31 MED ORDER — VITAMIN B-12 1000 MCG PO TABS
500.0000 ug | ORAL_TABLET | Freq: Every day | ORAL | Status: DC
  Administered 2020-02-01: 500 ug via ORAL
  Filled 2020-01-31: qty 1

## 2020-01-31 MED ORDER — LIDOCAINE HCL (PF) 1 % IJ SOLN
INTRAMUSCULAR | Status: AC
  Filled 2020-01-31: qty 30

## 2020-01-31 MED ORDER — HEPARIN SODIUM (PORCINE) 1000 UNIT/ML IJ SOLN
INTRAMUSCULAR | Status: AC
  Filled 2020-01-31: qty 1

## 2020-01-31 MED ORDER — HEPARIN (PORCINE) IN NACL 1000-0.9 UT/500ML-% IV SOLN
INTRAVENOUS | Status: AC
  Filled 2020-01-31: qty 1000

## 2020-01-31 MED ORDER — SODIUM CHLORIDE 0.9 % IV SOLN: INTRAVENOUS | Status: AC

## 2020-01-31 MED ORDER — SODIUM CHLORIDE 0.9% FLUSH: 3.0000 mL | INTRAVENOUS | Status: DC | PRN

## 2020-01-31 MED ORDER — IOHEXOL 350 MG/ML SOLN
INTRAVENOUS | Status: DC | PRN
  Administered 2020-01-31: 170 mL

## 2020-01-31 MED ORDER — ASCORBIC ACID 500 MG PO TABS
500.0000 mg | ORAL_TABLET | Freq: Every day | ORAL | Status: DC
  Administered 2020-01-31 – 2020-02-01 (×2): 500 mg via ORAL
  Filled 2020-01-31 (×2): qty 1

## 2020-01-31 MED ORDER — SODIUM CHLORIDE 0.9 % IV SOLN: 250.0000 mL | INTRAVENOUS | Status: DC | PRN

## 2020-01-31 MED ORDER — ADULT MULTIVITAMIN W/MINERALS CH
1.0000 | ORAL_TABLET | Freq: Every day | ORAL | Status: DC
  Administered 2020-02-01: 1 via ORAL
  Filled 2020-01-31: qty 1

## 2020-01-31 MED ORDER — ALBUTEROL SULFATE (2.5 MG/3ML) 0.083% IN NEBU: 2.5000 mg | INHALATION_SOLUTION | Freq: Four times a day (QID) | RESPIRATORY_TRACT | Status: DC | PRN

## 2020-01-31 MED ORDER — IOHEXOL 350 MG/ML SOLN
INTRAVENOUS | Status: AC
  Filled 2020-01-31: qty 1

## 2020-01-31 MED ORDER — FAMOTIDINE 20 MG PO TABS
20.0000 mg | ORAL_TABLET | Freq: Every day | ORAL | Status: DC
  Administered 2020-02-01: 20 mg via ORAL
  Filled 2020-01-31: qty 1

## 2020-01-31 MED ORDER — IBUPROFEN 200 MG PO TABS
400.0000 mg | ORAL_TABLET | Freq: Every day | ORAL | Status: DC
  Administered 2020-01-31: 400 mg via ORAL
  Filled 2020-01-31: qty 2

## 2020-01-31 MED ORDER — SODIUM CHLORIDE 0.9 % WEIGHT BASED INFUSION: 1.0000 mL/kg/h | INTRAVENOUS | Status: DC

## 2020-01-31 MED ORDER — VERAPAMIL HCL 2.5 MG/ML IV SOLN
INTRAVENOUS | Status: AC
  Filled 2020-01-31: qty 2

## 2020-01-31 MED ORDER — ONDANSETRON HCL 4 MG/2ML IJ SOLN: 4.0000 mg | Freq: Four times a day (QID) | INTRAMUSCULAR | Status: DC | PRN

## 2020-01-31 MED ORDER — NITROGLYCERIN 1 MG/10 ML FOR IR/CATH LAB
INTRA_ARTERIAL | Status: AC
  Filled 2020-01-31: qty 10

## 2020-01-31 MED ORDER — MIDAZOLAM HCL 2 MG/2ML IJ SOLN
INTRAMUSCULAR | Status: AC
  Filled 2020-01-31: qty 2

## 2020-01-31 MED ORDER — IBUPROFEN-DIPHENHYDRAMINE HCL 200-25 MG PO CAPS: 2.0000 | ORAL_CAPSULE | Freq: Every day | ORAL | Status: DC

## 2020-01-31 MED ORDER — ACETAMINOPHEN 325 MG PO TABS: 650.0000 mg | ORAL_TABLET | ORAL | Status: DC | PRN

## 2020-01-31 MED ORDER — AMLODIPINE BESYLATE 5 MG PO TABS
5.0000 mg | ORAL_TABLET | Freq: Every day | ORAL | Status: DC
  Administered 2020-02-01: 5 mg via ORAL
  Filled 2020-01-31: qty 1

## 2020-01-31 MED ORDER — VERAPAMIL HCL 2.5 MG/ML IV SOLN
INTRAVENOUS | Status: DC | PRN
  Administered 2020-01-31: 10 mL via INTRA_ARTERIAL

## 2020-01-31 MED ORDER — HEPARIN (PORCINE) IN NACL 1000-0.9 UT/500ML-% IV SOLN
INTRAVENOUS | Status: DC | PRN
  Administered 2020-01-31 (×2): 500 mL

## 2020-01-31 MED ORDER — EZETIMIBE 10 MG PO TABS
10.0000 mg | ORAL_TABLET | Freq: Every day | ORAL | Status: DC
  Administered 2020-01-31 – 2020-02-01 (×2): 10 mg via ORAL
  Filled 2020-01-31 (×2): qty 1

## 2020-01-31 MED ORDER — PRAVASTATIN SODIUM 40 MG PO TABS
40.0000 mg | ORAL_TABLET | Freq: Every day | ORAL | Status: DC
  Administered 2020-02-01: 40 mg via ORAL
  Filled 2020-01-31: qty 1

## 2020-01-31 MED ORDER — CYCLOBENZAPRINE HCL 10 MG PO TABS: 10.0000 mg | ORAL_TABLET | Freq: Two times a day (BID) | ORAL | Status: DC | PRN

## 2020-01-31 MED ORDER — SODIUM CHLORIDE 0.9% FLUSH
3.0000 mL | Freq: Two times a day (BID) | INTRAVENOUS | Status: DC
  Administered 2020-01-31: 3 mL via INTRAVENOUS

## 2020-01-31 MED ORDER — ASPIRIN 81 MG PO CHEW: 81.0000 mg | CHEWABLE_TABLET | ORAL | Status: DC

## 2020-01-31 MED ORDER — LIDOCAINE HCL (PF) 1 % IJ SOLN
INTRAMUSCULAR | Status: DC | PRN
  Administered 2020-01-31: 2 mL

## 2020-01-31 MED ORDER — DIPHENHYDRAMINE HCL 25 MG PO CAPS
50.0000 mg | ORAL_CAPSULE | Freq: Every day | ORAL | Status: DC
  Administered 2020-01-31: 50 mg via ORAL
  Filled 2020-01-31: qty 2

## 2020-01-31 MED ORDER — ASPIRIN EC 81 MG PO TBEC
81.0000 mg | DELAYED_RELEASE_TABLET | Freq: Every day | ORAL | Status: DC
  Administered 2020-02-01: 81 mg via ORAL
  Filled 2020-01-31: qty 1

## 2020-01-31 MED ORDER — DOCUSATE SODIUM 100 MG PO CAPS
100.0000 mg | ORAL_CAPSULE | Freq: Every day | ORAL | Status: DC
  Filled 2020-01-31: qty 1

## 2020-01-31 MED ORDER — NITROGLYCERIN 1 MG/10 ML FOR IR/CATH LAB
INTRA_ARTERIAL | Status: DC | PRN
  Administered 2020-01-31 (×2): 200 ug via INTRACORONARY

## 2020-01-31 MED ORDER — SODIUM CHLORIDE 0.9 % WEIGHT BASED INFUSION
3.0000 mL/kg/h | INTRAVENOUS | Status: DC
  Administered 2020-01-31: 3 mL/kg/h via INTRAVENOUS

## 2020-01-31 MED ORDER — IBUPROFEN 200 MG PO TABS: 400.0000 mg | ORAL_TABLET | Freq: Four times a day (QID) | ORAL | Status: DC | PRN

## 2020-01-31 MED ORDER — PANTOPRAZOLE SODIUM 40 MG PO TBEC
40.0000 mg | DELAYED_RELEASE_TABLET | Freq: Every day | ORAL | Status: DC
  Administered 2020-02-01: 40 mg via ORAL
  Filled 2020-01-31: qty 1

## 2020-01-31 MED ORDER — SODIUM CHLORIDE 0.9% FLUSH: 3.0000 mL | Freq: Two times a day (BID) | INTRAVENOUS | Status: DC

## 2020-01-31 MED ORDER — HYDRALAZINE HCL 20 MG/ML IJ SOLN: 10.0000 mg | INTRAMUSCULAR | Status: AC | PRN

## 2020-01-31 MED ORDER — MIDAZOLAM HCL 2 MG/2ML IJ SOLN
INTRAMUSCULAR | Status: DC | PRN
  Administered 2020-01-31 (×2): 1 mg via INTRAVENOUS

## 2020-01-31 MED ORDER — LABETALOL HCL 5 MG/ML IV SOLN: 10.0000 mg | INTRAVENOUS | Status: AC | PRN

## 2020-01-31 MED ORDER — UMECLIDINIUM-VILANTEROL 62.5-25 MCG/INH IN AEPB
1.0000 | INHALATION_SPRAY | Freq: Every day | RESPIRATORY_TRACT | Status: DC
  Administered 2020-02-01: 1 via RESPIRATORY_TRACT
  Filled 2020-01-31: qty 14

## 2020-01-31 MED ORDER — FENTANYL CITRATE (PF) 100 MCG/2ML IJ SOLN
INTRAMUSCULAR | Status: AC
  Filled 2020-01-31: qty 2

## 2020-01-31 MED ORDER — HEPARIN SODIUM (PORCINE) 1000 UNIT/ML IJ SOLN
INTRAMUSCULAR | Status: DC | PRN
  Administered 2020-01-31 (×2): 4000 [IU] via INTRAVENOUS
  Administered 2020-01-31: 1000 [IU] via INTRAVENOUS
  Administered 2020-01-31: 3000 [IU] via INTRAVENOUS

## 2020-01-31 SURGICAL SUPPLY — 23 items
BALLN SAPPHIRE 2.5X15 (BALLOONS) ×2
BALLN SAPPHIRE ~~LOC~~ 3.25X15 (BALLOONS) ×1 IMPLANT
BALLOON SAPPHIRE 2.5X15 (BALLOONS) IMPLANT
CATH OPTITORQUE TIG 4.0 5F (CATHETERS) ×1 IMPLANT
CATH TELEPORT (CATHETERS) ×1 IMPLANT
CATH VISTA GUIDE 6FR XB3.5 (CATHETERS) ×1 IMPLANT
CROWN DIAMONDBACK CLASSIC 1.25 (BURR) ×1 IMPLANT
DEVICE RAD COMP TR BAND LRG (VASCULAR PRODUCTS) ×1 IMPLANT
GLIDESHEATH SLEND SS 6F .021 (SHEATH) ×1 IMPLANT
GUIDEWIRE INQWIRE 1.5J.035X260 (WIRE) IMPLANT
GUIDEWIRE PRESSURE COMET II (WIRE) ×1 IMPLANT
INQWIRE 1.5J .035X260CM (WIRE) ×2
KIT ENCORE 26 ADVANTAGE (KITS) ×1 IMPLANT
KIT ESSENTIALS PG (KITS) ×1 IMPLANT
KIT HEART LEFT (KITS) ×2 IMPLANT
LUBRICANT VIPERSLIDE CORONARY (MISCELLANEOUS) ×1 IMPLANT
PACK CARDIAC CATHETERIZATION (CUSTOM PROCEDURE TRAY) ×2 IMPLANT
STENT SYNERGY XD 3.0X28 (Permanent Stent) IMPLANT
SYNERGY XD 3.0X28 (Permanent Stent) ×2 IMPLANT
TRANSDUCER W/STOPCOCK (MISCELLANEOUS) ×2 IMPLANT
TUBING CIL FLEX 10 FLL-RA (TUBING) ×2 IMPLANT
WIRE ASAHI PROWATER 180CM (WIRE) ×1 IMPLANT
WIRE VIPERWIRE COR FLEX .012 (WIRE) ×1 IMPLANT

## 2020-01-31 NOTE — Brief Op Note (Signed)
    01/31/2020  10:32 AM  PATIENT:  Corey Tate  79 y.o. male with extensive comorbidities including bilateral vertebral artery disease, PAD, severe COPD on home oxygen who was referred for coronary CTA read as severely abnormal coronary CT angiogram (coronary calcium score 3200 with severe disease in RCA, LCx and LAD).  Based on the severe findings, he was referred for invasive evaluation with cardiac catheterization and possible PCI.  Not felt to be a good CABG candidate due to comorbidities.  SURGEON:  Surgeon(s) and Role:    * Leonie Man, MD - Primary  PRE-OPERATIVE DIAGNOSIS:  cad  POST-OPERATIVE DIAGNOSIS:   Severe three-vessel CAD:   Diffuse mid RCA 70% followed by 100% CTO (distal RCA fills via right to right collaterals),  Heavily calcified left coronary artery:  70% eccentric proximal LAD (DFR 0.90) followed by tandem sequential 60% and 50% lesions in a very tortuous calcified mid LAD on either side of 2 small diagonal branches,   First 1 Successful DFR guided, CSI Orbital Atherectomy-DES PCI of proximal LA D: Synergy DES 3.0 mm x 28 mm -postdilated to 3.3 mm.  sequential calcified 50, 70 and 90% stenoses beginning just proximal to small but significant 1st Mrg. -->  Plan staged CSI PCI on 02/08/2020  Appears to be well preserved LVEF with no significant wall motion abnormalities on hand-injection LV gram.  Normal LVEDP  PROCEDURE:  Procedure(s): LEFT HEART CATH AND CORONARY ANGIOGRAPHY (N/A) INTRAVASCULAR PRESSURE WIRE/FFR STUDY (N/A) CORONARY ATHERECTOMY (N/A) CORONARY STENT INTERVENTION (N/A)  ANESTHESIA:   2 mg Versed, 100 mcg fentanyl, 3 mL subcu lidocaine  EBL: < 50 mL  MEDICATIONS USED: Heparin 12,000 units, 170 mL contrast, IC NTG 200 mcg x 3  TR band: 13 mL, 9:40 AM:    DICTATION: .Note written in EPIC  PLAN OF CARE: Admit for overnight observation  -gentle hydration for 10 hours; anticipate discharge in the morning with plan staged PCI of the  LCx on Wednesday morning 02/08/2020 (10 AM)  PATIENT DISPOSITION:  PACU - hemodynamically stable.   Delay start of Pharmacological VTE agent (>24hrs) due to surgical blood loss or risk of bleeding: not applicable    Glenetta Hew, MD

## 2020-01-31 NOTE — Interval H&P Note (Signed)
History and Physical Interval Note:  01/31/2020 7:13 AM  Corey Tate  has presented today for surgery, with the diagnosis of cad with angina - abnormal cardiac CTA.    The various methods of treatment have been discussed with the patient and family.   After consideration of risks, benefits and other options for treatment, the patient has consented to  Procedure(s): LEFT HEART CATH AND CORONARY ANGIOGRAPHY (N/A)  PERCUTANEOUS CORONARY INTERVENTION  as a surgical intervention.    The patient's history has been reviewed, patient examined, no change in status, stable for surgery.  I have reviewed the patient's chart and labs.  Questions were answered to the patient's satisfaction.    Cath Lab Visit (complete for each Cath Lab visit)  Clinical Evaluation Leading to the Procedure:   ACS: No.  Non-ACS:    Anginal Classification: CCS II - atypical sx - DOE  Anti-ischemic medical therapy: Minimal Therapy (1 class of medications)  Non-Invasive Test Results: High-risk stress test findings: cardiac mortality >3%/year  Prior CABG: No previous CABG    Glenetta Hew

## 2020-02-01 ENCOUNTER — Encounter (HOSPITAL_COMMUNITY): Payer: Self-pay | Admitting: Cardiology

## 2020-02-01 DIAGNOSIS — R931 Abnormal findings on diagnostic imaging of heart and coronary circulation: Secondary | ICD-10-CM

## 2020-02-01 DIAGNOSIS — R0600 Dyspnea, unspecified: Secondary | ICD-10-CM | POA: Diagnosis not present

## 2020-02-01 DIAGNOSIS — I2584 Coronary atherosclerosis due to calcified coronary lesion: Secondary | ICD-10-CM | POA: Diagnosis not present

## 2020-02-01 DIAGNOSIS — I25118 Atherosclerotic heart disease of native coronary artery with other forms of angina pectoris: Secondary | ICD-10-CM | POA: Diagnosis not present

## 2020-02-01 LAB — BASIC METABOLIC PANEL
Anion gap: 7 (ref 5–15)
BUN: 12 mg/dL (ref 8–23)
CO2: 31 mmol/L (ref 22–32)
Calcium: 9.5 mg/dL (ref 8.9–10.3)
Chloride: 100 mmol/L (ref 98–111)
Creatinine, Ser: 1.05 mg/dL (ref 0.61–1.24)
GFR calc Af Amer: >60 mL/min (ref >60)
GFR calc non Af Amer: >60 mL/min (ref >60)
Glucose, Bld: 80 mg/dL (ref 70–99)
Potassium: 4.1 mmol/L (ref 3.5–5.1)
Sodium: 138 mmol/L (ref 135–145)

## 2020-02-01 LAB — CBC
HCT: 37.2 % — ABNORMAL LOW (ref 39.0–52.0)
Hemoglobin: 12.8 g/dL — ABNORMAL LOW (ref 13.0–17.0)
MCH: 34.7 pg — ABNORMAL HIGH (ref 26.0–34.0)
MCHC: 34.4 g/dL (ref 30.0–36.0)
MCV: 100.8 fL — ABNORMAL HIGH (ref 80.0–100.0)
Platelets: 190 10*3/uL (ref 150–400)
RBC: 3.69 MIL/uL — ABNORMAL LOW (ref 4.22–5.81)
RDW: 11.9 % (ref 11.5–15.5)
WBC: 7.8 10*3/uL (ref 4.0–10.5)
nRBC: 0 % (ref 0.0–0.2)

## 2020-02-01 LAB — POCT ACTIVATED CLOTTING TIME
Activated Clotting Time: 285 seconds
Activated Clotting Time: 318 seconds
Activated Clotting Time: 246 seconds

## 2020-02-01 MED ORDER — DOCUSATE SODIUM 100 MG PO CAPS: 100.0000 mg | ORAL_CAPSULE | Freq: Every day | ORAL | Status: DC

## 2020-02-01 MED ORDER — UMECLIDINIUM-VILANTEROL 62.5-25 MCG/INH IN AEPB: 1.0000 | INHALATION_SPRAY | Freq: Every day | RESPIRATORY_TRACT | Status: DC

## 2020-02-01 MED FILL — Heparin Sodium (Porcine) Inj 1000 Unit/ML: INTRAMUSCULAR | Qty: 10 | Status: AC

## 2020-02-01 NOTE — Progress Notes (Signed)
CARDIAC REHAB PHASE I   PRE:  Rate/Rhythm: 74 SR  BP:  Supine: 163/87  Sitting:   Standing:    SaO2: 96% 4L   93%RA  MODE:  Ambulation: 160 ft   POST:  Rate/Rhythm: 84 SR PACs  BP:  Supine:   Sitting: 142/69  Standing:    SaO2: 89-97%RA 1015-1120 Pt stated he has walker at home if needed and he has oxygen. Stated he has not used the oxygen in months. Took pt off 4L since he has not been using at home. Walked 160 ft on RA with monitoring of sats during walk. When pleth is good, he is above 89% on RA. Denied increase of SOB (stated he is always a little SOB after walking). Had pt rest at 83 ft and I checked with second sat monitor and above 90%. No CP.  Discussed with pt the importance of taking his meds especially plavix. Reviewed NTG use and left heart healthy diet for wife ( she does the cooking ). Discussed CRP 2 and will refer to [facility] but pt stated will not be able to do because of back issues (about 7 surgeries). Ex ed not given due to back issues. Pt will walk as tolerated. Set up breakfast for pt while he sat on side of bed. Bed alarm on. Notified RN that I had taken off oxygen and sats have been good as long as pleth is good.    Graylon Good, RN BSN  02/01/2020 11:15 AM

## 2020-02-01 NOTE — Discharge Summary (Signed)
Discharge Summary    Patient ID: Corey Tate MRN: [id number]; DOB: March 16, 1941  Admit date: 01/31/2020 Discharge date: 02/01/2020  Primary Care Provider: Imagene Riches, NP  Primary Cardiologist: Shirlee More, MD  Primary Electrophysiologist:  None   Discharge Diagnoses    Principal Problem:   Abnormal cardiac CT angiography Active Problems:   COPD, severe ([place])   Dyslipidemia   Hypertensive chronic kidney disease   Coronary artery disease involving native coronary artery of native heart with angina pectoris [facility]) s/p  DES pLAD   DOE (dyspnea on exertion)  - as Angina Equivalent   Diagnostic Studies/Procedures    LHC 01/31/20  The left ventricular systolic function is normal. EF~55-65% by visual estimate. Normal LVEDP.  There is aortic valve calcification with no stenosis.  -  Prox LAD to Mid LAD lesion is 70% stenosed.  A drug-eluting stent was successfully placed using a SYNERGY XD 3.0X28.-Postdilated to 3.3 mm  Post intervention, there is a 0% residual stenosis.  Mid LAD lesion is 60% stenosed in the bend just after the stented segment, not good PCI targets.  Mid LAD to Dist LAD lesion is 50% stenosed.  -  Prox Cx lesion is 55% stenosed -prior to 1st Mrg. 1st Mrg lesion is 50% stenosed.  Mid Cx lesion is 70% stenosed. Dist Cx lesion is 90% stenosed.  LPAV lesion is 45% stenosed.  Prox RCA to Mid RCA lesion is 70% stenosed. Mid RCA lesion is 100% stenosed. -> Distal RCA fills via right-right collaterals   SUMMARY  Severe three-vessel CAD:  ? Diffuse mid RCA 70% followed by 100% CTO (distal RCA fills via right to right collaterals),       ? Heavily calcified Left Coronary Artery:]  LAD: 70% eccentric proximal LAD (DFR 0.90) followed by tandem sequential 60% and 50% lesions in a very tortuous calcified mid LAD on either side of 2 small diagonal branches,   Successful DFR guided, CSI Orbital Atherectomy-DES PCI of proximal LAD: Synergy DES 3.0 mm x 28 mm  -postdilated to 3.3 mm.  LCx: sequential calcified 50, 70 and 90% stenoses beginning just proximal to small but significant 1st Mrg.  Appears to be well preserved LVEF with no significant wall motion abnormalities on hand-injection LV gram.  Normal LVEDP  RECOMMENDATION  Based on the patient's significant comorbidities with severe COPD, PAD and carotid disease along with CKD-3B along with significant residual LCx disease and occluded RCA, I felt it prudent to monitor the patient overnight for hydration.  He is statin intolerant -may need to consider PCSK9 inhibitor.  We will plan staged CSI PCI of LCx 10am case on Wed 8/18.  Coronary Diagrams  Diagnostic Dominance: Co-dominant  Intervention    _____________   History of Present Illness     Corey Tate is a 79 y.o. male with pmh of CAD, paroxysmal Afib not on a/c due to history of falls, Hypertension, CKD stage 2, dyslipidemia, severe COPD, AAA without rupture, Bilateral carotid artery stenosis, stenosis of right vertebral artery who presented to the ED with chest pain. He followed with Dr. Bettina Gavia for his cardiac issues. The patient previously had a carotid duplex 11/25/19 showing less than 50% stenosis on the right, 50-69% stenosis on the left. 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 CTA head and neck 12/05/19 which showed severe atherosclerosis of the aortic arch, Rt vertebral artery occluded proximal rt internal carotid artery tandem 60-65% stenosis stable, severe stenosis  in the left vertebral artery and heavy intracranial calcification B/L of the ICA. He was started on plavix, aspirin, high dose statin and zetia. The patient then underwent cardiac CTA 01/04/20 which showed calcium score severely elevated 3211 and severe multivessel CAD. The patient was brought into the office 01/18/20 to discuss results and he agreed to undergo cardiac catheterization.   Hospital Course      Consultants: None  The patient arrived to his scheduled left heart catheterization on 01/31/20. Cath showed severe 3V CAD, diffuse mid RCA 70% followed by 100% CTO (distal RCA RCA fills via right to right collaterals, heavy calcified left coronary artery, LAD with 70% eccentric prox LAD (DFR 0.90) followed by tandem sequential 60% and 50% lesions in a very tortuous calcified mid LAD, LCx with sequential calcified 50, 70 and 90% stenosis beginning just proximal to small but significant 1st margin, normal LVEF and normal LVEDP. The patient was treated with successful DFR guided, CSI orbital atherectomy with DES PCI of proximal LAD. Post procedure the patient was admitted due to multiple complicated comorbidities for hydration. Plan for staged CSI PCI of LCx on Wednesday 8/18. The patient was brought up to the floor in stable condition. He was started on DAPT with aspirin and plavix.  The cath site, right radial, remained stable without bruising, bleeding or tenderness. Follow up labs showed creatinine 1.05 and Hgb 12.8. Plan to continue aspirin and plavix for 1 year. Continue pravastatin however patient has statin intolerance and plan to further discuss PCSK9i. Plan to discharge patient and bring him back 8/18 for staged PCI. The office will be in contact with the patient.   The patient was evaluated by Dr. Audie Box 02/01/20 and felt to be stable for discharge.   GEN: He appears his age difficulty with hearing he does not look chronically ill or debilitated well nourished, well developed in no acute distress HEENT: Normal NECK: No JVD; No carotid bruits LYMPHATICS: No lymphadenopathy CARDIAC: RRR, no murmurs, rubs, gallops RESPIRATORY:  Clear to auscultation without rales, wheezing or rhonchi fusilli diminished breath sounds ABDOMEN: Soft, non-tender, non-distended MUSCULOSKELETAL:  No edema; No deformity  SKIN: Warm and dry NEUROLOGIC:  Alert and oriented x 3 PSYCHIATRIC:  Normal affect    Did the  patient have an acute coronary syndrome (MI, NSTEMI, STEMI, etc) this admission?:  No                               Did the patient have a percutaneous coronary intervention (stent / angioplasty)?:  Yes.     Cath/PCI Registry Performance & Quality Measures: 1. Aspirin prescribed? - Yes 2. ADP Receptor Inhibitor (Plavix/Clopidogrel, Brilinta/Ticagrelor or Effient/Prasugrel) prescribed (includes medically managed patients)? - Yes 3. High Intensity Statin (Lipitor 40-80mg  or Crestor 20-40mg ) prescribed? - No, statin intolerance 4. For EF <40%, was ACEI/ARB prescribed? - Not Applicable (EF >/= 43%) 5. For EF <40%, Aldosterone Antagonist (Spironolactone or Eplerenone) prescribed? - Not Applicable (EF >/= 15%) 6. Cardiac Rehab Phase II ordered? - No, plan for staged PCI at later date   _____________  Discharge Vitals Blood pressure 122/60, pulse 63, temperature 97.9 F (36.6 C), temperature source Oral, resp. rate 18, height 5\' 11"  (1.803 m), weight 74.4 kg, SpO2 93 %.  Filed Weights   01/31/20 0546 01/31/20 1448 02/01/20 0525  Weight: 74.8 kg 77.6 kg 74.4 kg    Labs & Radiologic Studies    CBC Recent Labs  02/01/20 0427  WBC 7.8  HGB 12.8*  HCT 37.2*  MCV 100.8*  PLT 834   Basic Metabolic Panel Recent Labs    02/01/20 0427  NA 138  K 4.1  CL 100  CO2 31  GLUCOSE 80  BUN 12  CREATININE 1.05  CALCIUM 9.5   Liver Function Tests No results for input(s): AST, ALT, ALKPHOS, BILITOT, PROT, ALBUMIN in the last 72 hours. No results for input(s): LIPASE, AMYLASE in the last 72 hours. High Sensitivity Troponin:   No results for input(s): TROPONINIHS in the last 720 hours.  BNP Invalid input(s): POCBNP D-Dimer No results for input(s): DDIMER in the last 72 hours. Hemoglobin A1C No results for input(s): HGBA1C in the last 72 hours. Fasting Lipid Panel No results for input(s): CHOL, HDL, LDLCALC, TRIG, CHOLHDL, LDLDIRECT in the last 72 hours. Thyroid Function Tests No  results for input(s): TSH, T4TOTAL, T3FREE, THYROIDAB in the last 72 hours.  Invalid input(s): FREET3 _____________  CARDIAC CATHETERIZATION  Result Date: 01/31/2020  The left ventricular systolic function is normal. EF~55-65% by visual estimate. Normal LVEDP.  There is aortic valve calcification with no stenosis.  -  Prox LAD to Mid LAD lesion is 70% stenosed.  A drug-eluting stent was successfully placed using a SYNERGY XD 3.0X28.-Postdilated to 3.3 mm  Post intervention, there is a 0% residual stenosis.  Mid LAD lesion is 60% stenosed in the bend just after the stented segment, not good PCI targets.  Mid LAD to Dist LAD lesion is 50% stenosed.  -  Prox Cx lesion is 55% stenosed -prior to 1st Mrg. 1st Mrg lesion is 50% stenosed.  Mid Cx lesion is 70% stenosed. Dist Cx lesion is 90% stenosed.  LPAV lesion is 45% stenosed.  Prox RCA to Mid RCA lesion is 70% stenosed. Mid RCA lesion is 100% stenosed. -> Distal RCA fills via right-right collaterals  SUMMARY  Severe three-vessel CAD:  Diffuse mid RCA 70% followed by 100% CTO (distal RCA fills via right to right collaterals),   Heavily calcified Left Coronary Artery:]  LAD: 70% eccentric proximal LAD (DFR 0.90) followed by tandem sequential 60% and 50% lesions in a very tortuous calcified mid LAD on either side of 2 small diagonal branches,  Successful DFR guided, CSI Orbital Atherectomy-DES PCI of proximal LAD: Synergy DES 3.0 mm x 28 mm -postdilated to 3.3 mm.  LCx: sequential calcified 50, 70 and 90% stenoses beginning just proximal to small but significant 1st Mrg.  Appears to be well preserved LVEF with no significant wall motion abnormalities on hand-injection LV gram.  Normal LVEDP RECOMMENDATION  Based on the patient's significant comorbidities with severe COPD, PAD and carotid disease along with CKD-3B along with significant residual LCx disease and occluded RCA, I felt it prudent to monitor the patient overnight for hydration.  He  is statin intolerant -may need to consider PCSK9 inhibitor.  We will plan staged CSI PCI of LCx 10am case on Wed 8/18. Glenetta Hew, MD  CT CORONARY [facility] W/CTA COR W/SCORE Lewanda Rife W/CM &/OR WO/CM  Addendum Date: 01/04/2020   ADDENDUM REPORT: 01/04/2020 21:38 CLINICAL DATA:  79 year old male with hypertensin and bilateral carotid disease. EXAM: Cardiac/Coronary  CT TECHNIQUE: The patient was scanned on a [employer]. FINDINGS: A 120 kV prospective scan was triggered in the descending thoracic aorta at 111 HU's. Axial non-contrast 3 mm slices were carried out through the heart. The data set was analyzed on a dedicated work station and scored using the  Agatson method. Gantry rotation speed was 250 msecs and collimation was .6 mm. No beta blockade and 0.8 mg of sl NTG was given. The 3D data set was reconstructed in 5% intervals of the 67-82 % of the R-R cycle. Diastolic phases were analyzed on a dedicated work station using MPR, MIP and VRT modes. The patient received 80 cc of contrast. Aorta: Normal size. There is mild aortic root calcifications. No dissection. Aortic Valve:  Trileaflet.  There is mild calcifications. Coronary Arteries:  Normal coronary origin.  Right dominance. RCA is a large dominant artery that gives rise to PDA and PLVB. There is significant diffuse severe (>70%) calcified plaques through out the RCA. There is a noncalcified lesion right above the bifurcation of the PLVB and PDA which appears to be close to 99% occluded. Left main is a large artery that gives rise to LAD and LCX arteries. LAD is a large vessel. The proximal LAD with a severe (>70%) calcified plaque. The Mid to Distal LAD is a long tubular severe (>70%) calcified plaque. There is minimal diffuse calcified plaque in the LAD. LCX is a non-dominant artery that gives rise to one large OM1 branch. There is a proximal moderate (50-69%) calcified plaque. The mid to distal LCX with severe (>70%) calcified plaque. The distal  LCX with moderate (50-69%) calcified plaque. Other findings: Normal pulmonary vein drainage into the left atrium. Normal left atrial appendage without a thrombus. Normal size of the pulmonary artery. Mitral annular calcification. IMPRESSION: 1. Severe Multi-vessel Coronary artery disease. CADRADS 4A. Recommend cardiac catheterization for revascularization consideration. 2. Coronary calcium score of 3211. This was 6 percentile for age and sex matched control. 2. Normal coronary origin with right dominance. 3. Aortic atherosclerosis. 4. Mild Mitral annular calcification. Berniece Salines, DO Electronically Signed   By: Berniece Salines MD   On: 01/04/2020 21:38   Result Date: 01/04/2020 EXAM: OVER-READ INTERPRETATION  CT CHEST The following report is an over-read performed by radiologist Dr. Rolm Baptise of [facility], [place] on 01/04/2020. This over-read does not include interpretation of cardiac or coronary anatomy or pathology. The coronary CTA interpretation by the cardiologist is attached. COMPARISON:  12/05/2019 FINDINGS: Vascular: Heavily calcified aorta. No aneurysm with maximum diameter of the visualized ascending aorta 3.4 cm. Mediastinum/Nodes: No adenopathy. Lungs/Pleura: Platelike areas of atelectasis in the right middle lobe. Mild elevation of the right hemidiaphragm. No confluent opacities or effusions. Upper Abdomen: Imaging into the upper abdomen shows no acute findings. Musculoskeletal: Chest wall soft tissues are unremarkable. No acute bony abnormality. IMPRESSION: Heavily calcified aorta.  No aneurysm. No acute extra cardiac abnormality. Electronically Signed: By: Rolm Baptise M.D. On: 01/04/2020 13:06   Disposition   Pt is being discharged home today in good condition.  Follow-up Plans & Appointments       Discharge Medications   Allergies as of 02/01/2020      Reactions   Ferra-caps [iron] Hives   Streptomycin Hives, Itching      Medication List    STOP taking these medications    ibuprofen 200 MG tablet Commonly known as: ADVIL     TAKE these medications   albuterol 108 (90 Base) MCG/ACT inhaler Commonly known as: VENTOLIN HFA Inhale 2 puffs into the lungs every 6 (six) hours as needed for wheezing or shortness of breath.   amLODipine 5 MG tablet Commonly known as: NORVASC Take 1 tablet (5 mg total) by mouth daily.   Anoro Ellipta 62.5-25 MCG/INH Aepb Generic drug: umeclidinium-vilanterol Inhale 1  puff into the lungs daily.   aspirin EC 81 MG tablet Take 81 mg by mouth daily.   clopidogrel 75 MG tablet Commonly known as: Plavix Take 1 tablet (75 mg total) by mouth daily.   cyclobenzaprine 10 MG tablet Commonly known as: FLEXERIL Take 10 mg by mouth 2 (two) times daily as needed for muscle spasms.   docusate sodium 100 MG capsule Commonly known as: COLACE Take 100 mg by mouth daily.   ezetimibe 10 MG tablet Commonly known as: ZETIA Take 1 tablet (10 mg total) by mouth daily.   famotidine 20 MG tablet Commonly known as: PEPCID Take 20 mg by mouth daily.   Fish Oil 1000 MG Caps Take 1,000 mg by mouth daily.   Ibuprofen PM 200-25 MG Caps Generic drug: Ibuprofen-diphenhydrAMINE HCl Take 2 tablets by mouth at bedtime.   multivitamin tablet Take 1 tablet by mouth daily.   OXYGEN Inhale 2 L into the lungs as needed.   pantoprazole 40 MG tablet Commonly known as: PROTONIX Take 40 mg by mouth daily.   pravastatin 40 MG tablet Commonly known as: PRAVACHOL Take 40 mg by mouth daily.   vitamin B-12 500 MCG tablet Commonly known as: CYANOCOBALAMIN Take 500 mcg by mouth daily.   vitamin C 500 MG tablet Commonly known as: ASCORBIC ACID Take 500 mg by mouth daily.   VITAMIN K2-VITAMIN D3 PO Take 1 tablet by mouth daily.          Outstanding Labs/Studies   None  Duration of Discharge Encounter   Greater than 30 minutes including physician time.  Signed, Myracle Febres Ninfa Meeker, PA-C 02/01/2020, 10:28 AM

## 2020-02-01 NOTE — Progress Notes (Signed)
Patient has all discharge orders and instructions. Patient has all belongings with him upon discharge. Patient questions have been answered

## 2020-02-01 NOTE — Plan of Care (Signed)

## 2020-02-01 NOTE — Plan of Care (Signed)
  Problem: Education: Goal: Understanding of CV disease, CV risk reduction, and recovery process will improve Outcome: Adequate for Discharge Goal: Individualized Educational Video(s) Outcome: Adequate for Discharge   Problem: Activity: Goal: Ability to return to baseline activity level will improve Outcome: Adequate for Discharge   Problem: Cardiovascular: Goal: Ability to achieve and maintain adequate cardiovascular perfusion will improve Outcome: Adequate for Discharge Goal: Vascular access site(s) Level 0-1 will be maintained Outcome: Adequate for Discharge   Problem: Health Behavior/Discharge Planning: Goal: Ability to safely manage health-related needs after discharge will improve Outcome: Adequate for Discharge   Problem: Education: Goal: Knowledge of General Education information will improve Description: Including pain rating scale, medication(s)/side effects and non-pharmacologic comfort measures Outcome: Adequate for Discharge   Problem: Health Behavior/Discharge Planning: Goal: Ability to manage health-related needs will improve Outcome: Adequate for Discharge   Problem: Clinical Measurements: Goal: Ability to maintain clinical measurements within normal limits will improve Outcome: Adequate for Discharge Goal: Will remain free from infection Outcome: Adequate for Discharge Goal: Diagnostic test results will improve Outcome: Adequate for Discharge Goal: Respiratory complications will improve Outcome: Adequate for Discharge Goal: Cardiovascular complication will be avoided Outcome: Adequate for Discharge   Problem: Safety: Goal: Ability to remain free from injury will improve Outcome: Adequate for Discharge   Problem: Pain Managment: Goal: General experience of comfort will improve Outcome: Adequate for Discharge   Problem: Skin Integrity: Goal: Risk for impaired skin integrity will decrease Outcome: Adequate for Discharge

## 2020-02-06 ENCOUNTER — Other Ambulatory Visit (HOSPITAL_COMMUNITY)
Admission: RE | Admit: 2020-02-06 | Discharge: 2020-02-06 | Disposition: A | Discharge status: Home / Self Care | Payer: Medicare Other | Source: Ambulatory Visit | Attending: Cardiology | Admitting: Cardiology

## 2020-02-06 DIAGNOSIS — Z20822 Contact with and (suspected) exposure to covid-19: Secondary | ICD-10-CM | POA: Insufficient documentation

## 2020-02-06 DIAGNOSIS — Z01812 Encounter for preprocedural laboratory examination: Secondary | ICD-10-CM | POA: Insufficient documentation

## 2020-02-06 LAB — SARS CORONAVIRUS 2 (TAT 6-24 HRS): SARS Coronavirus 2: NEGATIVE

## 2020-02-07 ENCOUNTER — Telehealth: Payer: Self-pay | Admitting: *Deleted

## 2020-02-07 NOTE — Telephone Encounter (Signed)
Pt contacted pre-coronary atherectomy  scheduled at [facility] for: Wednesday February 08, 2020 10 AM Verified arrival time and place: [place] [facility]) at: 8 AM   No solid food after midnight prior to cath, clear liquids until 5 AM day of procedure.  AM meds can be  taken pre-cath with sips of water including: ASA 81 mg Plavix 75 mg  Confirmed patient has responsible adult to drive home post procedure and observe 24 hours after arriving home: yes  You are allowed ONE visitor in the waiting room during your procedure. Both you and your visitor must wear a mask once you enter the hospital.       COVID-19 Pre-Screening Questions:  . In the past 14 days have you had a new cough, shortness of breath, headache, congestion, fever (100 or greater) unexplained body aches, new sore throat, or sudden loss of taste or sense of smell? no . In the past 14  days have you been around anyone with known Covid 19? no . Have you been vaccinated for COVID-19? Yes, see immunization history  Reviewed procedure/mask/visitor instructions, COVID-19 questions with patient's wife (DPR), Margaretha Sheffield.

## 2020-02-08 ENCOUNTER — Encounter (HOSPITAL_COMMUNITY): Payer: Self-pay | Admitting: Cardiology

## 2020-02-08 ENCOUNTER — Ambulatory Visit (HOSPITAL_COMMUNITY)
Admission: RE | Admit: 2020-02-08 | Discharge: 2020-02-08 | Disposition: A | Discharge status: Home / Self Care | Payer: Medicare Other | Attending: Cardiology | Admitting: Cardiology

## 2020-02-08 ENCOUNTER — Telehealth: Payer: Self-pay | Admitting: Physician Assistant

## 2020-02-08 ENCOUNTER — Other Ambulatory Visit: Payer: Self-pay

## 2020-02-08 ENCOUNTER — Encounter (HOSPITAL_COMMUNITY)
Admission: RE | Disposition: A | Discharge status: Home / Self Care | Payer: Self-pay | Source: Home / Self Care | Attending: Cardiology

## 2020-02-08 DIAGNOSIS — I10 Essential (primary) hypertension: Secondary | ICD-10-CM

## 2020-02-08 DIAGNOSIS — I25118 Atherosclerotic heart disease of native coronary artery with other forms of angina pectoris: Secondary | ICD-10-CM | POA: Diagnosis present

## 2020-02-08 DIAGNOSIS — Z7902 Long term (current) use of antithrombotics/antiplatelets: Secondary | ICD-10-CM | POA: Insufficient documentation

## 2020-02-08 DIAGNOSIS — K219 Gastro-esophageal reflux disease without esophagitis: Secondary | ICD-10-CM | POA: Diagnosis not present

## 2020-02-08 DIAGNOSIS — Z79899 Other long term (current) drug therapy: Secondary | ICD-10-CM | POA: Insufficient documentation

## 2020-02-08 DIAGNOSIS — I129 Hypertensive chronic kidney disease with stage 1 through stage 4 chronic kidney disease, or unspecified chronic kidney disease: Secondary | ICD-10-CM | POA: Insufficient documentation

## 2020-02-08 DIAGNOSIS — N182 Chronic kidney disease, stage 2 (mild): Secondary | ICD-10-CM

## 2020-02-08 DIAGNOSIS — Z7982 Long term (current) use of aspirin: Secondary | ICD-10-CM | POA: Diagnosis not present

## 2020-02-08 DIAGNOSIS — I739 Peripheral vascular disease, unspecified: Secondary | ICD-10-CM | POA: Diagnosis not present

## 2020-02-08 DIAGNOSIS — F17210 Nicotine dependence, cigarettes, uncomplicated: Secondary | ICD-10-CM | POA: Insufficient documentation

## 2020-02-08 DIAGNOSIS — I25119 Atherosclerotic heart disease of native coronary artery with unspecified angina pectoris: Secondary | ICD-10-CM | POA: Diagnosis not present

## 2020-02-08 DIAGNOSIS — Z95820 Peripheral vascular angioplasty status with implants and grafts: Secondary | ICD-10-CM

## 2020-02-08 DIAGNOSIS — E785 Hyperlipidemia, unspecified: Secondary | ICD-10-CM | POA: Diagnosis not present

## 2020-02-08 DIAGNOSIS — I6523 Occlusion and stenosis of bilateral carotid arteries: Secondary | ICD-10-CM | POA: Insufficient documentation

## 2020-02-08 DIAGNOSIS — I714 Abdominal aortic aneurysm, without rupture: Secondary | ICD-10-CM | POA: Diagnosis not present

## 2020-02-08 DIAGNOSIS — Z955 Presence of coronary angioplasty implant and graft: Secondary | ICD-10-CM | POA: Insufficient documentation

## 2020-02-08 DIAGNOSIS — I6501 Occlusion and stenosis of right vertebral artery: Secondary | ICD-10-CM | POA: Insufficient documentation

## 2020-02-08 DIAGNOSIS — I4821 Permanent atrial fibrillation: Secondary | ICD-10-CM | POA: Diagnosis not present

## 2020-02-08 DIAGNOSIS — J449 Chronic obstructive pulmonary disease, unspecified: Secondary | ICD-10-CM | POA: Diagnosis not present

## 2020-02-08 DIAGNOSIS — I482 Chronic atrial fibrillation, unspecified: Secondary | ICD-10-CM | POA: Diagnosis present

## 2020-02-08 DIAGNOSIS — I480 Paroxysmal atrial fibrillation: Secondary | ICD-10-CM | POA: Diagnosis present

## 2020-02-08 DIAGNOSIS — R0600 Dyspnea, unspecified: Secondary | ICD-10-CM | POA: Diagnosis present

## 2020-02-08 DIAGNOSIS — R0609 Other forms of dyspnea: Secondary | ICD-10-CM | POA: Diagnosis present

## 2020-02-08 HISTORY — PX: LEFT HEART CATH: CATH118248

## 2020-02-08 HISTORY — DX: Atherosclerotic heart disease of native coronary artery without angina pectoris: I25.10

## 2020-02-08 HISTORY — DX: Chronic kidney disease, stage 2 (mild): N18.2

## 2020-02-08 HISTORY — DX: Essential (primary) hypertension: I10

## 2020-02-08 HISTORY — PX: CORONARY ATHERECTOMY: CATH118238

## 2020-02-08 LAB — POCT ACTIVATED CLOTTING TIME
Activated Clotting Time: 230 seconds
Activated Clotting Time: 279 seconds
Activated Clotting Time: 296 seconds

## 2020-02-08 SURGERY — CORONARY ATHERECTOMY
Anesthesia: LOCAL

## 2020-02-08 MED ORDER — SODIUM CHLORIDE 0.9 % WEIGHT BASED INFUSION: 1.0000 mL/kg/h | INTRAVENOUS | Status: DC

## 2020-02-08 MED ORDER — LIDOCAINE HCL (PF) 1 % IJ SOLN
INTRAMUSCULAR | Status: AC
  Filled 2020-02-08: qty 30

## 2020-02-08 MED ORDER — VIPERSLIDE LUBRICANT OPTIME
TOPICAL | Status: DC | PRN
  Administered 2020-02-08: 20 mL via SURGICAL_CAVITY

## 2020-02-08 MED ORDER — HEPARIN SODIUM (PORCINE) 1000 UNIT/ML IJ SOLN
INTRAMUSCULAR | Status: AC
  Filled 2020-02-08: qty 1

## 2020-02-08 MED ORDER — MIDAZOLAM HCL 2 MG/2ML IJ SOLN
INTRAMUSCULAR | Status: AC
  Filled 2020-02-08: qty 2

## 2020-02-08 MED ORDER — FAMOTIDINE 20 MG PO TABS: 20.0000 mg | ORAL_TABLET | Freq: Every day | ORAL | Status: DC

## 2020-02-08 MED ORDER — IOHEXOL 350 MG/ML SOLN
INTRAVENOUS | Status: AC
  Filled 2020-02-08: qty 1

## 2020-02-08 MED ORDER — ASPIRIN 81 MG PO CHEW: 81.0000 mg | CHEWABLE_TABLET | ORAL | Status: DC

## 2020-02-08 MED ORDER — ANGIOPLASTY BOOK
Status: AC
  Filled 2020-02-08: qty 1

## 2020-02-08 MED ORDER — ONDANSETRON HCL 4 MG/2ML IJ SOLN: 4.0000 mg | Freq: Four times a day (QID) | INTRAMUSCULAR | Status: DC | PRN

## 2020-02-08 MED ORDER — NITROGLYCERIN 0.4 MG SL SUBL
0.4000 mg | SUBLINGUAL_TABLET | SUBLINGUAL | 3 refills | Status: DC | PRN
Start: 2020-02-08 — End: 2020-02-23

## 2020-02-08 MED ORDER — PANTOPRAZOLE SODIUM 40 MG PO TBEC: 40.0000 mg | DELAYED_RELEASE_TABLET | Freq: Every day | ORAL | Status: DC

## 2020-02-08 MED ORDER — SODIUM CHLORIDE 0.9% FLUSH: 3.0000 mL | INTRAVENOUS | Status: DC | PRN

## 2020-02-08 MED ORDER — CLOPIDOGREL BISULFATE 75 MG PO TABS: 75.0000 mg | ORAL_TABLET | ORAL | Status: DC

## 2020-02-08 MED ORDER — UMECLIDINIUM-VILANTEROL 62.5-25 MCG/INH IN AEPB: 1.0000 | INHALATION_SPRAY | Freq: Every day | RESPIRATORY_TRACT | Status: DC

## 2020-02-08 MED ORDER — ACETAMINOPHEN 325 MG PO TABS: 650.0000 mg | ORAL_TABLET | ORAL | Status: DC | PRN

## 2020-02-08 MED ORDER — HYDRALAZINE HCL 20 MG/ML IJ SOLN: 10.0000 mg | INTRAMUSCULAR | Status: DC | PRN

## 2020-02-08 MED ORDER — AMLODIPINE BESYLATE 5 MG PO TABS: 5.0000 mg | ORAL_TABLET | Freq: Every day | ORAL | Status: DC

## 2020-02-08 MED ORDER — SODIUM CHLORIDE 0.9 % WEIGHT BASED INFUSION
3.0000 mL/kg/h | INTRAVENOUS | Status: AC
  Administered 2020-02-08: 3 mL/kg/h via INTRAVENOUS

## 2020-02-08 MED ORDER — SODIUM CHLORIDE 0.9% FLUSH: 3.0000 mL | Freq: Two times a day (BID) | INTRAVENOUS | Status: DC

## 2020-02-08 MED ORDER — IOHEXOL 350 MG/ML SOLN
INTRAVENOUS | Status: DC | PRN
  Administered 2020-02-08: 95 mL

## 2020-02-08 MED ORDER — PRAVASTATIN SODIUM 40 MG PO TABS: 40.0000 mg | ORAL_TABLET | Freq: Every day | ORAL | Status: DC

## 2020-02-08 MED ORDER — NITROGLYCERIN 1 MG/10 ML FOR IR/CATH LAB
INTRA_ARTERIAL | Status: DC | PRN
  Administered 2020-02-08 (×2): 200 ug via INTRACORONARY

## 2020-02-08 MED ORDER — LIDOCAINE HCL (PF) 1 % IJ SOLN
INTRAMUSCULAR | Status: DC | PRN
  Administered 2020-02-08: 2 mL

## 2020-02-08 MED ORDER — SODIUM CHLORIDE 0.9 % IV SOLN: 250.0000 mL | INTRAVENOUS | Status: DC | PRN

## 2020-02-08 MED ORDER — CLOPIDOGREL BISULFATE 75 MG PO TABS: 75.0000 mg | ORAL_TABLET | Freq: Every day | ORAL | Status: DC

## 2020-02-08 MED ORDER — DOCUSATE SODIUM 100 MG PO CAPS: 100.0000 mg | ORAL_CAPSULE | Freq: Every day | ORAL | Status: DC

## 2020-02-08 MED ORDER — HEPARIN (PORCINE) IN NACL 1000-0.9 UT/500ML-% IV SOLN
INTRAVENOUS | Status: AC
  Filled 2020-02-08: qty 1000

## 2020-02-08 MED ORDER — NITROGLYCERIN 1 MG/10 ML FOR IR/CATH LAB
INTRA_ARTERIAL | Status: AC
  Filled 2020-02-08: qty 10

## 2020-02-08 MED ORDER — VERAPAMIL HCL 2.5 MG/ML IV SOLN
INTRAVENOUS | Status: DC | PRN
  Administered 2020-02-08: 10 mL via INTRA_ARTERIAL

## 2020-02-08 MED ORDER — LABETALOL HCL 5 MG/ML IV SOLN: 10.0000 mg | INTRAVENOUS | Status: DC | PRN

## 2020-02-08 MED ORDER — FENTANYL CITRATE (PF) 100 MCG/2ML IJ SOLN
INTRAMUSCULAR | Status: DC | PRN
  Administered 2020-02-08 (×3): 25 ug via INTRAVENOUS

## 2020-02-08 MED ORDER — EZETIMIBE 10 MG PO TABS: 10.0000 mg | ORAL_TABLET | Freq: Every day | ORAL | Status: DC

## 2020-02-08 MED ORDER — ASPIRIN EC 81 MG PO TBEC: 81.0000 mg | DELAYED_RELEASE_TABLET | Freq: Every day | ORAL | Status: DC

## 2020-02-08 MED ORDER — FENTANYL CITRATE (PF) 100 MCG/2ML IJ SOLN
INTRAMUSCULAR | Status: AC
  Filled 2020-02-08: qty 2

## 2020-02-08 MED ORDER — VERAPAMIL HCL 2.5 MG/ML IV SOLN
INTRAVENOUS | Status: AC
  Filled 2020-02-08: qty 2

## 2020-02-08 MED ORDER — MIDAZOLAM HCL 2 MG/2ML IJ SOLN
INTRAMUSCULAR | Status: DC | PRN
  Administered 2020-02-08 (×2): 1 mg via INTRAVENOUS

## 2020-02-08 MED ORDER — SODIUM CHLORIDE 0.9 % IV SOLN: INTRAVENOUS | Status: DC

## 2020-02-08 MED ORDER — CYCLOBENZAPRINE HCL 10 MG PO TABS: 10.0000 mg | ORAL_TABLET | Freq: Two times a day (BID) | ORAL | Status: DC | PRN

## 2020-02-08 MED ORDER — HEPARIN (PORCINE) IN NACL 1000-0.9 UT/500ML-% IV SOLN
INTRAVENOUS | Status: DC | PRN
  Administered 2020-02-08 (×2): 500 mL

## 2020-02-08 MED ORDER — HEPARIN SODIUM (PORCINE) 1000 UNIT/ML IJ SOLN
INTRAMUSCULAR | Status: DC | PRN
  Administered 2020-02-08: 7000 [IU] via INTRAVENOUS
  Administered 2020-02-08: 4000 [IU] via INTRAVENOUS

## 2020-02-08 SURGICAL SUPPLY — 23 items
BALLN SAPPHIRE 2.5X12 (BALLOONS) ×2
BALLN SAPPHIRE ~~LOC~~ 3.0X12 (BALLOONS) ×1 IMPLANT
BALLN SAPPHIRE ~~LOC~~ 3.0X18 (BALLOONS) ×1 IMPLANT
BALLOON SAPPHIRE 2.5X12 (BALLOONS) IMPLANT
CATH OPTITORQUE TIG 4.0 5F (CATHETERS) ×1 IMPLANT
CATH VISTA GUIDE 6FR XBLAD3.5 (CATHETERS) ×1 IMPLANT
CROWN DIAMONDBACK CLASSIC 1.25 (BURR) ×1 IMPLANT
DEVICE RAD COMP TR BAND LRG (VASCULAR PRODUCTS) ×1 IMPLANT
ELECT DEFIB PAD ADLT CADENCE (PAD) ×1 IMPLANT
GLIDESHEATH SLEND SS 6F .021 (SHEATH) ×1 IMPLANT
GUIDEWIRE INQWIRE 1.5J.035X260 (WIRE) IMPLANT
INQWIRE 1.5J .035X260CM (WIRE) ×2
KIT ENCORE 26 ADVANTAGE (KITS) ×1 IMPLANT
KIT HEART LEFT (KITS) ×2 IMPLANT
LUBRICANT VIPERSLIDE CORONARY (MISCELLANEOUS) ×1 IMPLANT
PACK CARDIAC CATHETERIZATION (CUSTOM PROCEDURE TRAY) ×2 IMPLANT
SHEATH PROBE COVER 6X72 (BAG) ×1 IMPLANT
STENT SYNERGY XD 2.75X28 (Permanent Stent) IMPLANT
SYNERGY XD 2.75X28 (Permanent Stent) ×2 IMPLANT
TRANSDUCER W/STOPCOCK (MISCELLANEOUS) ×2 IMPLANT
TUBING CIL FLEX 10 FLL-RA (TUBING) ×2 IMPLANT
WIRE ASAHI PROWATER 180CM (WIRE) ×1 IMPLANT
WIRE VIPERWIRE COR FLEX .012 (WIRE) ×1 IMPLANT

## 2020-02-08 NOTE — Progress Notes (Signed)
Cadence, PA in to see client and ok to d/c home

## 2020-02-08 NOTE — Telephone Encounter (Signed)
    Attention TOC pool,  This patient will need a TOC phone call after discharge. They are being discharged today. Follow-up appointment has already been arranged with: Dr. Bettina Gavia 9/2 They are a patient of Shirlee More, MD.  Thank you! Charlie Pitter, PA-C

## 2020-02-08 NOTE — Interval H&P Note (Signed)
History and Physical Interval Note:  02/08/2020 10:06 AM  Corey Tate  has presented today for surgery, with the diagnosis of cad with atherectomy PCI of the LAD and existing lesions in the LCx.  He also has an occluded RCA.Corey Tate   He had diagnostic catheterization on 01/31/2020 revealing CTO of the RCA with severe proximal LAD disease, calcified as well as tandem lesions in the LCx (mid and distal).  He underwent CSI atherectomy based PCI of the proximal LAD.  In order to conserve contrast and fluoroscopy time, we will plan staged PCI of the LCx today.  He now presents for staged PCI.  He has been relatively stable since PCI.  Has not been very active.   The various methods of treatment have been discussed with the patient and family. After consideration of risks, benefits and other options for treatment, the patient has consented to  Procedure(s): CORONARY ATHERECTOMY (N/A)  CORONARY STENT INTERVENTION  as a surgical intervention.  The patient's history has been reviewed, patient examined, no change in status, stable for surgery.  I have reviewed the patient's chart and labs.  Questions were answered to the patient's satisfaction.    Cath Lab Visit (complete for each Cath Lab visit)  Clinical Evaluation Leading to the Procedure:   ACS: No.  Non-ACS:    Anginal Classification: CCS II  Anti-ischemic medical therapy: Minimal Therapy (1 class of medications)  Non-Invasive Test Results: High-risk stress test findings: cardiac mortality >3%/year =-> high risk coronary CTA results  Prior CABG: No previous CABG   Glenetta Hew

## 2020-02-08 NOTE — Progress Notes (Signed)
[id number] Brief education reviewed as I saw pt last week after PCI. Encouraged plavix and meds. Reviewed NTG use, heart healthy food choices and walking as tolerated due to back issues. Referral letter to be faxed to [facility] but pt doubtful he can do with back isues.  Graylon Good RN BSN 02/08/2020 2:25 PM

## 2020-02-08 NOTE — Discharge Summary (Addendum)
Discharge Summary for Same Day PCI   Patient ID: Corey Tate MRN: [id number]; DOB: [birth year]  Admit date: 02/08/2020 Discharge date: 02/08/2020  Primary Care Provider: Imagene Riches, NP  Primary Cardiologist: Shirlee More, MD  Primary Electrophysiologist:  None   Discharge Diagnoses    Principal Problem:   Coronary artery disease involving native coronary artery of native heart with angina pectoris [facility]) Active Problems:   DOE (dyspnea on exertion)  - as Angina Equivalent   Peripheral vascular disease (HCC)   PAF (paroxysmal atrial fibrillation) ([place])   HTN (hypertension)   CKD (chronic kidney disease), stage II    Diagnostic Studies/Procedures    Cardiac Catheterization 02/08/2020:   Prox Cx lesion is 55% stenosed. 1st Mrg lesion is 50% stenosed.  Mid Cx lesion is 70% stenosed. Dist Cx lesion is 90% stenosed.  Orbital atherectomy followed by was performed on both lesions.  A drug-eluting stent was successfully placed covering both lesions, using a SYNERGY XD 2.75X28. Post dilated to 3.1 mm  Post intervention, there is a 0% residual stenosis throughout the stented segment.  LPAV lesion is 45% stenosed.  ------------------  Previously placed Prox LAD to Mid LAD drug eluting stent is widely patent.  Mid LAD lesion is 60% stenosed. Mid LAD to Dist LAD lesion is 50% stenosed.  ------------------  LV end diastolic pressure is mildly elevated.  There is no aortic valve stenosis.   SUMMARY  Successful orbital atherectomy and DES PCI of the mid and distal LCx covering both lesions with a single Synergy DES 2.75 mm x 28 mm postdilated to 2.9 mm  Borderline LVEDP     _____________   History of Present Illness     Corey Tate is a 79 y.o. 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  He followed with Dr. Bettina Gavia for his cardiac issues. He recently underwent CTA head/neck, carotid duplex and CT angio of the abdomen demonstrating PAD in multiple areas as well as a 3.5cm infrarenal AAA. The patient then underwent cardiac CTA 01/04/20 for shortness of breath which showed calcium score severely elevated 3211 and severe multivessel CAD. Dr. Bettina Gavia recommended cardiac cath, and had planned to send him to see vascular surgery after his procedure.  He was brought in earlier this month for planned cardiac cath with findings above, ultimately treated with DFR-guided CSI orbital atherectomy with DES PCI of proximal LAD. He was admitted post-procedurally for observation and did well. The plan was for him to return for CSI PCI of the LCx today.  Hospital Course     The patient underwent cardiac cath as noted above with successful orbital atherectomy and DES PCI of the mid and distal LCx covering both lesions with a single Synergy DES 2.75 mm x 28 mm postdilated to 2.9 mm. He was already on DAPT prior to admission which he will continue (ASA + Plavix) - long term use recommended due to multivessel CAD, carotid disease and PAD. The patient was seen by cardiac rehab while in short stay. There were no observed complications post cath. He remained asymptomatic. Radial cath site will be re-evaluated prior to discharge by on-call APP this evening. Instructions/precautions regarding cath site care were given prior to discharge.   Corey Tate was seen by Dr. Ellyn Hack and determined stable for discharge home. Follow up  with our office has been arranged. Medications are listed below.  He was on pravastatin prior to admission with Zetia due to prior statin intolerance and should be considered for PCSK9i as outpatient. SL NTG rx was sent in (pt declined [place] and requested it be sent to usual pharmacy).  _____________  Cath/PCI Registry Performance & Quality  Measures: 1. Aspirin prescribed? - Yes 2. ADP Receptor Inhibitor (Plavix/Clopidogrel, Brilinta/Ticagrelor or Effient/Prasugrel) prescribed (includes medically managed patients)? - Yes 3. High Intensity Statin (Lipitor 40-80mg  or Crestor 20-40mg ) prescribed? - No - statin intolerance, to consider PCSK9i as OP 4. For EF <40%, was ACEI/ARB prescribed? - Not Applicable (EF >/= 74%) 5. For EF <40%, Aldosterone Antagonist (Spironolactone or Eplerenone) prescribed? - Not Applicable (EF >/= 25%) 6. Cardiac Rehab Phase II ordered (Included Medically managed Patients)? - Yes although patient unsure he can participate given chronic back problems  _____________   Discharge Vitals Blood pressure (!) 127/37, pulse (!) 58, temperature 97.8 F (36.6 C), temperature source Oral, resp. rate (!) 22, height 5\' 11"  (1.803 m), weight 74.8 kg, SpO2 98 %.  Filed Weights   02/08/20 0813  Weight: 74.8 kg  Tele: NSR with occasional PACs/PVCs  Last Labs & Radiologic Studies    N/A _____________  CARDIAC CATHETERIZATION  Result Date: 02/08/2020  Prox Cx lesion is 55% stenosed. 1st Mrg lesion is 50% stenosed.  Mid Cx lesion is 70% stenosed. Dist Cx lesion is 90% stenosed.  Orbital atherectomy followed by was performed on both lesions.  A drug-eluting stent was successfully placed covering both lesions, using a SYNERGY XD 2.75X28. Post dilated to 3.1 mm  Post intervention, there is a 0% residual stenosis throughout the stented segment.  LPAV lesion is 45% stenosed.  ------------------  Previously placed Prox LAD to Mid LAD drug eluting stent is widely patent.  Mid LAD lesion is 60% stenosed. Mid LAD to Dist LAD lesion is 50% stenosed.  ------------------  LV end diastolic pressure is mildly elevated.  There is no aortic valve stenosis.  SUMMARY  Successful orbital atherectomy and DES PCI of the mid and distal LCx covering both lesions with a single Synergy DES 2.75 mm x 28 mm postdilated to 2.9 mm   Borderline LVEDP Glenetta Hew, M.D., M.S. Interventional Cardiologist Pager # [phone] Phone # [phone] [address] [place], [place] [address]  CARDIAC CATHETERIZATION  Result Date: 01/31/2020  The left ventricular systolic function is normal. EF~55-65% by visual estimate. Normal LVEDP.  There is aortic valve calcification with no stenosis.  -  Prox LAD to Mid LAD lesion is 70% stenosed.  A drug-eluting stent was successfully placed using a SYNERGY XD 3.0X28.-Postdilated to 3.3 mm  Post intervention, there is a 0% residual stenosis.  Mid LAD lesion is 60% stenosed in the bend just after the stented segment, not good PCI targets.  Mid LAD to Dist LAD lesion is 50% stenosed.  -  Prox Cx lesion is 55% stenosed -prior to 1st Mrg. 1st Mrg lesion is 50% stenosed.  Mid Cx lesion is 70% stenosed. Dist Cx lesion is 90% stenosed.  LPAV lesion is 45% stenosed.  Prox RCA to Mid RCA lesion is 70% stenosed. Mid RCA lesion is 100% stenosed. -> Distal RCA fills via right-right collaterals  SUMMARY  Severe three-vessel CAD:  Diffuse mid RCA 70% followed by 100% CTO (distal RCA fills via right to right collaterals),   Heavily calcified Left Coronary Artery:]  LAD: 70% eccentric proximal LAD (DFR 0.90) followed by tandem  sequential 60% and 50% lesions in a very tortuous calcified mid LAD on either side of 2 small diagonal branches,  Successful DFR guided, CSI Orbital Atherectomy-DES PCI of proximal LAD: Synergy DES 3.0 mm x 28 mm -postdilated to 3.3 mm.  LCx: sequential calcified 50, 70 and 90% stenoses beginning just proximal to small but significant 1st Mrg.  Appears to be well preserved LVEF with no significant wall motion abnormalities on hand-injection LV gram.  Normal LVEDP RECOMMENDATION  Based on the patient's significant comorbidities with severe COPD, PAD and carotid disease along with CKD-3B along with significant residual LCx disease and occluded RCA, I felt it prudent to  monitor the patient overnight for hydration.  He is statin intolerant -may need to consider PCSK9 inhibitor.  We will plan staged CSI PCI of LCx 10am case on Wed 8/18. Glenetta Hew, MD   Disposition   Pt is being discharged home today in good condition.  Follow-up Plans & Appointments     Follow-up Information    Richardo Priest, MD Follow up.   Specialty: Cardiology Why: We have moved your follow-up appointment with Dr. Bettina Gavia to Thursday February 23, 2020 at 10:00 AM (Arrive by 9:45 AM). Contact information: [place] [address] [phone]              Discharge Instructions    Amb Referral to Cardiac Rehabilitation   Complete by: As directed    Referring to [place] CRP 2   Diagnosis: Coronary Stents   After initial evaluation and assessments completed: Virtual Based Care may be provided alone or in conjunction with Phase 2 Cardiac Rehab based on patient barriers.: Yes   Diet - low sodium heart healthy   Complete by: As directed    Discharge instructions   Complete by: As directed    You were taking one medicine with ibuprofen in it. In general, patients taking blood thinners should generally stay away from medicines like ibuprofen, Advil, Motrin, naproxen, and Aleve due to risk of stomach bleeding. You may take Tylenol as directed or talk to your primary doctor about alternatives. If you notice any bleeding such as blood in stool, black tarry stools, blood in urine, nosebleeds or any other unusual bleeding, call your doctor immediately. It is not normal to have this kind of bleeding while on a blood thinner and usually indicates there is an underlying problem with one of your body systems that needs to be checked out.   Increase activity slowly   Complete by: As directed    No driving for 3 days. No lifting over 5 lbs for 1 week. No sexual activity for 1 week. Keep procedure site clean & dry. If you notice increased pain, swelling, bleeding or pus,  call/return!  You may shower, but no soaking baths/hot tubs/pools for 1 week.       Discharge Medications   Allergies as of 02/08/2020      Reactions   Ferra-caps [iron] Hives   Streptomycin Hives, Itching      Medication List    STOP taking these medications   Ibuprofen PM 200-25 MG Caps Generic drug: Ibuprofen-diphenhydrAMINE HCl     TAKE these medications   albuterol 108 (90 Base) MCG/ACT inhaler Commonly known as: VENTOLIN HFA Inhale 2 puffs into the lungs every 6 (six) hours as needed for wheezing or shortness of breath.   amLODipine 5 MG tablet Commonly known as: NORVASC Take 1 tablet (5 mg total) by mouth daily.   Anoro  Ellipta 62.5-25 MCG/INH Aepb Generic drug: umeclidinium-vilanterol Inhale 1 puff into the lungs daily.   aspirin EC 81 MG tablet Take 81 mg by mouth daily.   clopidogrel 75 MG tablet Commonly known as: Plavix Take 1 tablet (75 mg total) by mouth daily.   cyclobenzaprine 10 MG tablet Commonly known as: FLEXERIL Take 10 mg by mouth 2 (two) times daily as needed for muscle spasms.   docusate sodium 100 MG capsule Commonly known as: COLACE Take 100 mg by mouth daily.   ezetimibe 10 MG tablet Commonly known as: ZETIA Take 1 tablet (10 mg total) by mouth daily.   famotidine 20 MG tablet Commonly known as: PEPCID Take 20 mg by mouth daily.   Fish Oil 1000 MG Caps Take 1,000 mg by mouth daily.   multivitamin tablet Take 1 tablet by mouth daily.   nitroGLYCERIN 0.4 MG SL tablet Commonly known as: Nitrostat Place 1 tablet (0.4 mg total) under the tongue every 5 (five) minutes as needed for chest pain (up to 3 doses. If taking 3rd dose call 911).   OXYGEN Inhale 2 L into the lungs as needed.   pantoprazole 40 MG tablet Commonly known as: PROTONIX Take 40 mg by mouth daily.   pravastatin 40 MG tablet Commonly known as: PRAVACHOL Take 40 mg by mouth daily.   vitamin B-12 500 MCG tablet Commonly known as: CYANOCOBALAMIN Take 500  mcg by mouth daily.   vitamin C 500 MG tablet Commonly known as: ASCORBIC ACID Take 500 mg by mouth daily.   VITAMIN K2-VITAMIN D3 PO Take 1 tablet by mouth daily.          Allergies Allergies  Allergen Reactions  . Ferra-Caps [Iron] Hives  . Streptomycin Hives and Itching    Outstanding Labs/Studies   N/A  Duration of Discharge Encounter   Greater than 30 minutes including physician time.  Signed, Charlie Pitter, PA-C 02/08/2020, 3:48 PM

## 2020-02-08 NOTE — Discharge Instructions (Signed)
Coronary Angioplasty, Care After This sheet gives you information about how to care for yourself after your procedure. Your health care provider may also give you more specific instructions. If you have problems or questions, contact your health care provider. What can I expect after the procedure? After your procedure, it is common to have:  Bruising at the catheter insertion site. This usually fades within 1-2 weeks.  Blood collecting in the tissue (hematoma) that may be painful to the touch. It should become smaller and less tender within 1-2 weeks. Follow these instructions at home: Medicines  Take over-the-counter and prescription medicines only as told by your health care provider.  Blood thinners may be prescribed after your procedure to improve blood flow. Bathing  You may shower 24-48 hours after the procedure or as told by your health care provider.  Do not take baths, swim, or use a hot tub until your health care provider approves. Insertion site care   Follow instructions from your health care provider about how to take care of your insertion site. Make sure you: ? Wash your hands with soap and water before you change your bandage (dressing). If soap and water are not available, use hand sanitizer. ? Change your dressing as told by your health care provider. ? Gently wash the site with plain soap and water. ? Use a clean towel to pat the area dry. ? Do not rub the site, because this may cause bleeding. ? Do not apply powder or lotion to the site.  Check your insertion site every day for signs of infection. Check for: ? More redness, swelling, or pain. ? More fluid or blood. ? Warmth. ? Pus or a bad smell. Lifestyle   Make any lifestyle changes as recommended by your health care provider. This may include: ? Not using any products that contain nicotine or tobacco, such as cigarettes and e-cigarettes. If you need help quitting, ask your health care  provider. ? Managing your weight. ? Getting regular exercise. ? Managing your blood pressure. ? Limiting your alcohol intake. ? Managing other health problems, such as diabetes.  Eat a heart-healthy diet. This should include plenty of fresh fruits and vegetables. Avoid foods that are: ? High in salt (sodium). ? Canned or highly processed. ? High in saturated fat or sugar. ? Fried. General instructions  Do not lift over 10 lb (4.5 kg) for 5 days after your procedure or as told by your health care provider.  Ask your health care provider when it is okay to: ? Return to work or school. ? Resume usual physical activities or sports. ? Resume sexual activity.  Keep all follow-up visits as told by your health care provider. This is important. Contact a health care provider if:  You have a fever.  You have chills.  You have increased bleeding from the insertion site. Hold pressure on the site. Get help right away if:  You develop chest pain or shortness of breath, feel faint, or pass out.  You have unusual pain at the insertion site.  You have redness, warmth, or swelling at the insertion site.  You have drainage (other than a small amount of blood on the dressing) from the insertion site.  The insertion site is bleeding, and the bleeding does not stop after 30 minutes of holding steady pressure on the site.  You develop bleeding from any other place, such as from the rectum. There may be bright red blood in your urine or stool, or it  may appear as black, tarry stool. This information is not intended to replace advice given to you by your health care provider. Make sure you discuss any questions you have with your health care provider. Document Revised: 05/22/2017 Document Reviewed: 01/13/2016 Elsevier Patient Education  2020 [employer]  This sheet gives you information about how to care for yourself after your procedure. Your health care provider may  also give you more specific instructions. If you have problems or questions, contact your health care provider. What can I expect after the procedure? After the procedure, it is common to have:  Bruising and tenderness at the catheter insertion area. Follow these instructions at home: Medicines  Take over-the-counter and prescription medicines only as told by your health care provider. Insertion site care  Follow instructions from your health care provider about how to take care of your insertion site. Make sure you: ? Wash your hands with soap and water before you change your bandage (dressing). If soap and water are not available, use hand sanitizer. ? Change your dressing as told by your health care provider. ? Leave stitches (sutures), skin glue, or adhesive strips in place. These skin closures may need to stay in place for 2 weeks or longer. If adhesive strip edges start to loosen and curl up, you may trim the loose edges. Do not remove adhesive strips completely unless your health care provider tells you to do that.  Check your insertion site every day for signs of infection. Check for: ? Redness, swelling, or pain. ? Fluid or blood. ? Pus or a bad smell. ? Warmth.  Do not take baths, swim, or use a hot tub until your health care provider approves.  You may shower 24-48 hours after the procedure, or as directed by your health care provider. ? Remove the dressing and gently wash the site with plain soap and water. ? Pat the area dry with a clean towel. ? Do not rub the site. That could cause bleeding.  Do not apply powder or lotion to the site. Activity   For 24 hours after the procedure, or as directed by your health care provider: ? Do not flex or bend the affected arm. ? Do not push or pull heavy objects with the affected arm. ? Do not drive yourself home from the hospital or clinic. You may drive 24 hours after the procedure unless your health care provider tells you not  to. ? Do not operate machinery or power tools.  Do not lift anything that is heavier than 10 lb (4.5 kg), or the limit that you are told, until your health care provider says that it is safe.  Ask your health care provider when it is okay to: ? Return to work or school. ? Resume usual physical activities or sports. ? Resume sexual activity. General instructions  If the catheter site starts to bleed, raise your arm and put firm pressure on the site. If the bleeding does not stop, get help right away. This is a medical emergency.  If you went home on the same day as your procedure, a responsible adult should be with you for the first 24 hours after you arrive home.  Keep all follow-up visits as told by your health care provider. This is important. Contact a health care provider if:  You have a fever.  You have redness, swelling, or yellow drainage around your insertion site. Get help right away if:  You have  unusual pain at the radial site.  The catheter insertion area swells very fast.  The insertion area is bleeding, and the bleeding does not stop when you hold steady pressure on the area.  Your arm or hand becomes pale, cool, tingly, or numb. These symptoms may represent a serious problem that is an emergency. Do not wait to see if the symptoms will go away. Get medical help right away. Call your local emergency services (911 in the [place] Do not drive yourself to the hospital. Summary  After the procedure, it is common to have bruising and tenderness at the site.  Follow instructions from your health care provider about how to take care of your radial site wound. Check the wound every day for signs of infection.  Do not lift anything that is heavier than 10 lb (4.5 kg), or the limit that you are told, until your health care provider says that it is safe. This information is not intended to replace advice given to you by your health care provider. Make sure you discuss any  questions you have with your health care provider. Document Revised: 07/15/2017 Document Reviewed: 07/15/2017 Elsevier Patient Education  2020 [place] about your medication: Plavix (anti-platelet agent)  Generic Name (Brand): clopidogrel (Plavix), once daily medication  PURPOSE: You are taking this medication along with aspirin to lower your chance of having a heart attack, stroke, or blood clots in your heart stent. These can be fatal. Plavix and aspirin help prevent platelets from sticking together and forming a clot that can block an artery or your stent.   Common SIDE EFFECTS you may experience include: bruising or bleeding more easily, shortness of breath  Do not stop taking PLAVIX without talking to the doctor who prescribes it for you. People who are treated with a stent and stop taking Plavix too soon, have a higher risk of getting a blood clot in the stent, having a heart attack, or dying. If you stop Plavix because of bleeding, or for other reasons, your risk of a heart attack or stroke may increase.   Avoid taking NSAID agents or anti-inflammatory medications such as ibuprofen, naproxen given increased bleed risk with plavix - can use acetaminophen (Tylenol) if needed for pain.  Avoid taking over the counter stomach medications omeprazole (Prilosec) or esomeprazole (Nexium) since these do interact and make plavix less effective - ask your pharmacist or doctor for alterative agents if needed for heartburn or GERD.   Tell all of your doctors and dentists that you are taking Plavix. They should talk to the doctor who prescribed Plavix for you before you have any surgery or invasive procedure.   Contact your health care provider if you experience: severe or uncontrollable bleeding, pink/red/brown urine, vomiting blood or vomit that looks like "coffee grounds", red or black stools (looks like tar), coughing up blood or blood  clots ----------------------------------------------------------------------------------------------------------------------

## 2020-02-08 NOTE — Interval H&P Note (Signed)
History and Physical Interval Note:  02/08/2020 10:10 AM  Please see intervening cath report 8/10/2021and discharge summary from 02/01/2020  Corey Tate

## 2020-02-09 ENCOUNTER — Encounter (HOSPITAL_COMMUNITY): Payer: Self-pay | Admitting: Cardiology

## 2020-02-09 NOTE — Telephone Encounter (Signed)
Patient contacted regarding discharge from [facility] on 02/08/20.  Patient understands to follow up with provider Dr. Bettina Gavia on 02/23/20 at 10 AM at [facility].  Patient understands discharge instructions? Yes Patient understands medications and regiment? Yes Patient understands to bring all medications to this visit? Yes  Ask patient:  Are you enrolled in My Chart no but will enroll at next office visit.

## 2020-02-21 DIAGNOSIS — C801 Malignant (primary) neoplasm, unspecified: Secondary | ICD-10-CM | POA: Insufficient documentation

## 2020-02-21 DIAGNOSIS — I2510 Atherosclerotic heart disease of native coronary artery without angina pectoris: Secondary | ICD-10-CM | POA: Insufficient documentation

## 2020-02-21 DIAGNOSIS — I10 Essential (primary) hypertension: Secondary | ICD-10-CM | POA: Insufficient documentation

## 2020-02-21 DIAGNOSIS — K219 Gastro-esophageal reflux disease without esophagitis: Secondary | ICD-10-CM | POA: Insufficient documentation

## 2020-02-21 DIAGNOSIS — J45909 Unspecified asthma, uncomplicated: Secondary | ICD-10-CM | POA: Insufficient documentation

## 2020-02-21 DIAGNOSIS — R0602 Shortness of breath: Secondary | ICD-10-CM | POA: Insufficient documentation

## 2020-02-22 NOTE — Progress Notes (Signed)
Cardiology Office Note:    Date:  02/23/2020   ID:  Corey, Tate 04/19/1941, MRN [id number]  PCP:  Corey Riches, NP  Cardiologist:  Corey More, MD    Referring MD: Corey Riches, NP    ASSESSMENT:    1. Coronary artery disease involving native coronary artery of native heart with angina pectoris (Corey Tate)   2. Dyslipidemia   3. 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 COPD, severe ([place])    PLAN:    In order of problems listed above:  1. He is doing well he is on guideline directed therapy continue uninterrupted dual antiplatelet therapy for minimum of 1 year and I will see back in the office in 6 months or sooner if needed.  Given a refill for nitroglycerin 2. Ideal lipids continue statin Zetia 3. Stable Home blood pressure consistently less than 1 35-4 30 systolic continue his current treatment including long-acting calcium channel blocker and repeat blood pressure by me in the office was 140/80 4. Stable managed by his PCP 5. Stable cerebrovascular disease continue long-term dual antiplatelet and lipid-lowering   Next appointment: 6 months   Medication Adjustments/Labs and Tests Ordered: Current medicines are reviewed at length with the patient today.  Concerns regarding medicines are outlined above.  No orders of the defined types were placed in this encounter.  Meds ordered this encounter  Medications  . nitroGLYCERIN (NITROSTAT) 0.4 MG SL tablet    Sig: Place 1 tablet (0.4 mg total) under the tongue every 5 (five) minutes as needed for chest pain (up to 3 doses. If taking 3rd dose call 911).    Dispense:  25 tablet    Refill:  3    No chief complaint on file.   History of Present Illness:    Corey Tate is a 79 y.o. male with a hx of CAD and recent PCI orbital atherectomy drug-eluting stents of the mid and distal left circumflex coronary artery Dr. Ricky Tate [place] Melfa Tate Tate after initial PCI of the LAD orbital  atherectomy and drug-eluting stent at the time of his initial coronary angiogram a 03/2020.[name]  He was last seen 01/18/2020.  Compliance with diet, lifestyle and medications: Yes  He has done well with his complex PCI no complications he feels somewhat better with strength and endurance but still has exertional shortness of breath due to severe underlying COPD.  He tolerates lipid-lowering therapy high intensity statin without muscle pain or weakness and compliant with dual antiplatelet without bleeding complication.  No edema orthopnea chest pain palpitation or syncope  He has a history of  paroxysmal atrial fibrillation seen 08/24/2018 after syncopal episode associated with a slow ventricular response.  Rate suppressant medication was withdrawn and a follow-up ambulatory heart rhythm monitor showed an adequate heart rate without episodes of bradycardia or pauses.  Other medical problems include severe COPD hypertension hyperlipidemia and peripheral vascular disease  last seen 06/21/202. He was seen 12/12/2019 after having CTA and duplex performed at [facility] and initiated clopidogrel along with aspirin and intensified lipid-lowering treatment adding Zetia to his statin..  I obtained and reviewed the reports and summarized below He had CTA head neck 12/05/2019 which shows severe atherosclerosis aortic arch head and neck right vertebral artery is occluded proximal right internal carotid artery tandem 60 to 65% stenosis stable severe stenosis left vertebral artery and also heavy intracranial calcification bilaterally of the ICA siphon.  Report from 2018 is similar except  the right vertebral artery was not occluded at that time.  I do not see any previous evaluation of abdominal aorta in care everywhere. Carotid duplex performed 11/25/2019 showed  less than 50% stenosis on the right 50 to 69% stenosis on the left. 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 . Compliance with diet, lifestyle and medications: Yes   He  underwent cardiac CTA 01/04/2020 showed a calcium score severely elevated 3211 greater than 90 percentile for age and sex and severe multivessel coronary artery disease: I reviewed the images with my partner Dr.Tobb.   Left heart cath intervention 01/31/2020: Coronary Diagrams  Diagnostic Dominance: Co-dominant  Intervention    Past Medical History:  Diagnosis Date  . Anxiety 12/11/2017  . Arthritis   . Asthma    ?  [name] Atherosclerosis of native artery of both lower extremities ([place]) 01/31/2016  . Atrial flutter by electrocardiogram ([place]) 12/10/2016  . Bilateral carotid artery stenosis 01/31/2016  . CAD (coronary artery disease)    a. 01/2020- DFR-guided CSI orbital atherectomy with DES PCI of proximal LAD with staged orbital atherectomy and DES PCI of the mid and distal LCx.  . Cancer ([place])    SKIN CANCERS  . Carotid bruit 12/11/2017  . CKD (chronic kidney disease), stage II 02/08/2020  . COPD, severe ([place]) 03/04/2016  . Coronary artery disease involving native coronary artery of native heart with angina pectoris ([place]) 01/31/2020  . Cough 03/04/2016  . Degenerative arthritis of hip 04/02/2012  . DOE (dyspnea on exertion)  - as Angina Equivalent 01/31/2020  . Dyslipidemia 09/26/2015  . GERD (gastroesophageal reflux disease)   . H/O varicella 12/11/2017  . Hypertension   . 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  . Nicotine dependence, uncomplicated 09/26/2701  . Nocturnal hypoxemia 03/04/2016  . PAF (paroxysmal atrial fibrillation) ([place]) 12/10/2016  . Peripheral vascular disease ([place])   . Shortness of breath    USES OXYGEN AT NIGHT--HX OF RIGHT LOWER LOBE PULMONARY NODULE--FOLLOWED BY PT'S MEDICAL DOCTOR AND HAS HAD FOR YEARS  . Smoking greater than 40 pack years 03/04/2016  . Syncope 08/04/2016    Past Surgical History:  Procedure Laterality Date  .  ATHERECTOMY  01/31/2020   Successful DFR guided, CSI Orbital Atherectomy-DES PCI of proximal LA  . BACK SURGERY     LOWER BACK SURGERY X 3 - FUSION  . CARPAL TUNNEL RELEASE AND SURGERY LEFT ELBOW    . CATARACT EXTRACTION    . CERVICAL DISC SURGERY     FUSION - ONLY SLIGHT LIMITATION IN NECK MOVEMENT  . CORONARY ATHERECTOMY N/A 01/31/2020   Procedure: CORONARY ATHERECTOMY;  Surgeon: Leonie Man, MD;  Location: [place];  Service: Cardiovascular;  Laterality: N/A;  . CORONARY ATHERECTOMY N/A 02/08/2020   Procedure: CORONARY ATHERECTOMY;  Surgeon: Leonie Man, MD;  Location: [place] [facility];  Service: Cardiovascular;  Laterality: N/A;  . CORONARY STENT INTERVENTION N/A 01/31/2020   Procedure: CORONARY STENT INTERVENTION;  Surgeon: Leonie Man, MD;  Location: [place] [facility];  Service: Cardiovascular;  Laterality: N/A;  . INTRAVASCULAR PRESSURE WIRE/FFR STUDY N/A 01/31/2020   Procedure: INTRAVASCULAR PRESSURE WIRE/FFR STUDY;  Surgeon: Leonie Man, MD;  Location: [place] [facility];  Service: Cardiovascular;  Laterality: N/A;  . LEFT HEART CATH N/A 02/08/2020   Procedure: Left Heart Cath;  Surgeon: Leonie Man, MD;  Location: [place] [facility];  Service: Cardiovascular;  Laterality: N/A;  . LEFT HEART CATH AND CORONARY ANGIOGRAPHY N/A 01/31/2020   Procedure: LEFT HEART CATH AND CORONARY ANGIOGRAPHY;  Surgeon: Leonie Man, MD;  Location: [place] [facility];  Service: Cardiovascular;  Laterality: N/A;  . POSTERIOR CERVICAL FUSION/FORAMINOTOMY Bilateral 09/30/2012   Procedure: CERVICAL SEVEN AND THORACIC ONE BILATERAL POSTERIOR CERVICAL FUSION/FORAMINOTOMY ;  Surgeon: Eustace Moore, MD;  Location: [facility];  Service: Neurosurgery;  Laterality: Bilateral;  . RIGHT SHOULDER SURGERY    . SINUS SURGERY WITH INSTATRAK    . TOTAL HIP ARTHROPLASTY  04/02/2012   Procedure: TOTAL HIP ARTHROPLASTY ANTERIOR APPROACH;  Surgeon: Mcarthur Rossetti, MD;  Location:  [facility];  Service: Orthopedics;  Laterality: Right;  Right Total Hip Arthroplasty  . VASCULAR SURGERY     STENT PLACEMENT RIGHT LEG AND "ROTOR ROOTER" LEFT LEG    Current Medications: Current Meds  Medication Sig  . albuterol (PROVENTIL HFA;VENTOLIN HFA) 108 (90 Base) MCG/ACT inhaler Inhale 2 puffs into the lungs every 6 (six) hours as needed for wheezing or shortness of breath.   [name] amLODipine (NORVASC) 5 MG tablet Take 1 tablet (5 mg total) by mouth daily.  Jearl Klinefelter ELLIPTA 62.5-25 MCG/INH AEPB Inhale 1 puff into the lungs daily.   [name] aspirin EC 81 MG tablet Take 81 mg by mouth daily.   . clopidogrel (PLAVIX) 75 MG tablet Take 1 tablet (75 mg total) by mouth daily.  . cyclobenzaprine (FLEXERIL) 10 MG tablet Take 10 mg by mouth 2 (two) times daily as needed for muscle spasms.   [name] docusate sodium (COLACE) 100 MG capsule Take 100 mg by mouth daily.  [name] ezetimibe (ZETIA) 10 MG tablet Take 1 tablet (10 mg total) by mouth daily.  . famotidine (PEPCID) 20 MG tablet Take 20 mg by mouth daily.  . Multiple Vitamin (MULTIVITAMIN) tablet Take 1 tablet by mouth daily.  . nitroGLYCERIN (NITROSTAT) 0.4 MG SL tablet Place 1 tablet (0.4 mg total) under the tongue every 5 (five) minutes as needed for chest pain (up to 3 doses. If taking 3rd dose call 911).  . Omega-3 Fatty Acids (FISH OIL) 1000 MG CAPS Take 1,000 mg by mouth daily.   . OXYGEN Inhale 2 L into the lungs as needed.  . pantoprazole (PROTONIX) 40 MG tablet Take 40 mg by mouth daily.  . pravastatin (PRAVACHOL) 40 MG tablet Take 40 mg by mouth daily.   . vitamin B-12 (CYANOCOBALAMIN) 500 MCG tablet Take 500 mcg by mouth daily.   . vitamin C (ASCORBIC ACID) 500 MG tablet Take 500 mg by mouth daily.  . Vitamin D-Vitamin K (VITAMIN K2-VITAMIN D3 PO) Take 1 tablet by mouth daily.  . [DISCONTINUED] nitroGLYCERIN (NITROSTAT) 0.4 MG SL tablet Place 1 tablet (0.4 mg total) under the tongue every 5 (five) minutes as needed for chest pain (up to 3 doses. If  taking 3rd dose call 911).     Allergies:   Ferra-caps [iron] and Streptomycin   Social History   Socioeconomic History  . Marital status: Married    Spouse name: Not on file  . Number of children: Not on file  . Years of education: Not on file  . Highest education level: Not on file  Occupational History  . Not on file  Tobacco Use  . Smoking status: Current Some Day Smoker    Packs/day: 1.00    Years: 55.00    Pack years: 55.00    Types: Cigarettes  . Smokeless tobacco:  Former Systems developer    Types: Secondary school teacher  . Vaping Use: Never used  Substance and Sexual Activity  . Alcohol use: Yes    Alcohol/week: 24.0 standard drinks    Types: 24 Cans of beer per week    Comment: 6 BEERS A DAY  . Drug use: No  . Sexual activity: Not on file  Other Topics Concern  . Not on file  Social History Narrative  . Not on file   Social Determinants of Health   Financial Resource Strain:   . Difficulty of Paying Living Expenses: Not on file  Food Insecurity:   . Worried About Charity fundraiser in the Last Year: Not on file  . Ran Out of Food in the Last Year: Not on file  Transportation Needs:   . Lack of Transportation (Medical): Not on file  . Lack of Transportation (Non-Medical): Not on file  Physical Activity:   . Days of Exercise per Week: Not on file  . Minutes of Exercise per Session: Not on file  Stress:   . Feeling of Stress : Not on file  Social Connections:   . Frequency of Communication with Friends and Family: Not on file  . Frequency of Social Gatherings with Friends and Family: Not on file  . Attends Religious Services: Not on file  . Active Member of Clubs or Organizations: Not on file  . Attends Archivist Meetings: Not on file  . Marital Status: Not on file     Family History: The patient's family history includes Cancer in his father; Diabetes in his brother and sister. ROS:   Please see the history of present illness.    All other systems  reviewed and are negative.  EKGs/Labs/Other Studies Reviewed:    The following studies were reviewed today:  EKG:  EKG performed [facility] 2020-01-15 showed sinus rhythm first-degree AV block 1 PVC and no ST-T ischemic abnormality  Recent Labs: 02/01/2020: BUN 12; Creatinine, Ser 1.05; Hemoglobin 12.8; Platelets 190; Potassium 4.1; Sodium 138  Recent Lipid Panel    Component Value Date/Time   CHOL 156 08/24/2018 1510   TRIG 69 08/24/2018 1510   HDL 62 08/24/2018 1510   CHOLHDL 2.5 08/24/2018 1510   LDLCALC 80 08/24/2018 1510    Physical Exam:    VS:  BP (!) 154/77   Pulse 72   Ht 5\' 11"  (1.803 m)   Wt 163 lb 9.6 oz (74.2 kg)   SpO2 94%   BMI 22.82 kg/m     Wt Readings from Last 3 Encounters:  02/23/20 163 lb 9.6 oz (74.2 kg)  02/08/20 165 lb (74.8 kg)  02/01/20 164 lb (74.4 kg)     GEN: He looks improved no longer is a grayish appearance well nourished, well developed in no acute distress HEENT: Normal NECK: No JVD; No carotid bruits LYMPHATICS: No lymphadenopathy CARDIAC: RRR, no murmurs, rubs, gallops RESPIRATORY:  Clear to auscultation without rales, wheezing or rhonchi  ABDOMEN: Soft, non-tender, non-distended MUSCULOSKELETAL:  No edema; No deformity  SKIN: Warm and dry NEUROLOGIC:  Alert and oriented x 3 PSYCHIATRIC:  Normal affect    Signed, Corey More, MD  02/23/2020 10:18 AM    [place]

## 2020-02-23 ENCOUNTER — Other Ambulatory Visit: Payer: Self-pay

## 2020-02-23 ENCOUNTER — Ambulatory Visit (INDEPENDENT_AMBULATORY_CARE_PROVIDER_SITE_OTHER): Payer: Medicare Other | Admitting: Cardiology

## 2020-02-23 ENCOUNTER — Encounter: Payer: Self-pay | Admitting: Cardiology

## 2020-02-23 VITALS — BP 154/77 | HR 72 | Ht 71.0 in | Wt 163.6 lb

## 2020-02-23 DIAGNOSIS — I129 Hypertensive chronic kidney disease with stage 1 through stage 4 chronic kidney disease, or unspecified chronic kidney disease: Secondary | ICD-10-CM | POA: Diagnosis not present

## 2020-02-23 DIAGNOSIS — J449 Chronic obstructive pulmonary disease, unspecified: Secondary | ICD-10-CM | POA: Diagnosis not present

## 2020-02-23 DIAGNOSIS — E785 Hyperlipidemia, unspecified: Secondary | ICD-10-CM

## 2020-02-23 DIAGNOSIS — I25119 Atherosclerotic heart disease of native coronary artery with unspecified angina pectoris: Secondary | ICD-10-CM

## 2020-02-23 DIAGNOSIS — N182 Chronic kidney disease, stage 2 (mild): Secondary | ICD-10-CM

## 2020-02-23 MED ORDER — NITROGLYCERIN 0.4 MG SL SUBL: 0.4000 mg | SUBLINGUAL_TABLET | SUBLINGUAL | 3 refills | Status: DC | PRN

## 2020-02-23 NOTE — Patient Instructions (Signed)

## 2020-02-24 ENCOUNTER — Telehealth (HOSPITAL_COMMUNITY): Payer: Self-pay

## 2020-02-24 NOTE — Telephone Encounter (Signed)
Faxed referral for Phase II Cardiac Rehab to [email or web address]

## 2020-03-09 ENCOUNTER — Ambulatory Visit: Payer: Medicare Other | Admitting: Cardiology

## 2020-09-03 NOTE — Progress Notes (Signed)
Cardiology Office Note:    Date:  09/04/2020   ID:  Corey Tate, Corey Tate [birth year], MRN [id number]  PCP:  Imagene Riches, NP  Cardiologist:  Shirlee More, MD    Referring MD: Imagene Riches, NP    ASSESSMENT:    1. Coronary artery disease involving native coronary artery of native heart with angina pectoris ([place])   2. AAA (abdominal aortic aneurysm) without rupture ([place])   3. 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 Dyslipidemia   5. COPD, severe ([place])    PLAN:    In order of problems listed above:  1. Primary problem is CAD and he did well with PCI and stent of right coronary artery and left anterior descending coronary artery he has no angina on current medical therapy and will continue treatment including his dual antiplatelet calcium channel blocker and combined statin and Zetia to achieve LDL goals. 2. Recheck duplex previously 3.5 cm 3. BP well controlled continue his current antihypertensive amlodipine and he tells me he is due for labs in the next few weeks with his PCP 4. Continue combined statin Zetia anticipate labs in the next few weeks 5. He continues to smoke and has stable exertional shortness of breath and has ambulatory oxygen as needed   Next appointment: 6 months   Medication Adjustments/Labs and Tests Ordered: Current medicines are reviewed at length with the patient today.  Concerns regarding medicines are outlined above.  Orders Placed This Encounter  Procedures  . VAS US AORTA/IVC/ILIACS   No orders of the defined types were placed in this encounter.  Follow-up for CAD  History of Present Illness:    Corey Tate is a 80 y.o. male with a hx of CAD dyslipidemia hypertension with stage II CKD and COPD severe last seen 02/23/2020.  He has a history of CAD and recent PCI orbital atherectomy drug-eluting stents of the mid and distal left circumflex coronary artery Dr. Ricky Ala [facility] 02/08/2020 after initial PCI of  the LAD orbital atherectomy and drug-eluting stent at the time of his initial coronary angiogram a 03/2020 had paroxysmal atrial fibrillation and syncope associated with a slow ventricular rate with withdrawal of his rates of present medications.He had CTA head neck 12/05/2019 which shows severe atherosclerosis aortic arch head and neck right vertebral artery is occluded proximal right internal carotid artery tandem 60 to 65% stenosis stable severe stenosis left vertebral artery and also heavy intracranial calcification bilaterally of the ICA siphon.  Report from 2018 is similar except the right vertebral artery was not occluded at that time.  I do not see any previous evaluation of abdominal aorta in care everywhere. Carotid duplex performed 11/25/2019 showed  less than 50% stenosis on the right 50 to 69% stenosis on the left. He also had CT angio of the abdomen showed a 3.5 centimeter infrarenal abdominal aortic aneurysm bilateral renal artery stenosis and left hydronephrosis  Compliance with diet, lifestyle and medications: Yes  Overall he is doing well he has no anginal discomfort but he has shortness of breath when he tries to do more than usual activities walking outdoors incline but not short of breath inside the home or with ADLs. He has had no follow-up for his abdominal aortic aneurysm noted last year duplex ordered in my office He has had no angina palpitation or syncope. He continues on dual antiplatelet therapy without bleeding He is on combined statin and Zetia and tells me he is due  for labs that he anticipates soon at his PCP office /Lipid profile 08/24/2018 showed LDL at target Past Medical History:  Diagnosis Date  . Anxiety 12/11/2017  . Arthritis   . Asthma    ?  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 Atrial flutter by electrocardiogram ([place]) 12/10/2016  . Bilateral carotid artery stenosis 01/31/2016  . CAD (coronary artery disease)    a.  01/2020- DFR-guided CSI orbital atherectomy with DES PCI of proximal LAD with staged orbital atherectomy and DES PCI of the mid and distal LCx.  . Cancer ([place])    SKIN CANCERS  . Carotid bruit 12/11/2017  . CKD (chronic kidney disease), stage II 02/08/2020  . COPD, severe ([place]) 03/04/2016  . Coronary artery disease involving native coronary artery of native heart with angina pectoris ([place]) 01/31/2020  . Cough 03/04/2016  . Degenerative arthritis of hip 04/02/2012  . DOE (dyspnea on exertion)  - as Angina Equivalent 01/31/2020  . Dyslipidemia 09/26/2015  . GERD (gastroesophageal reflux disease)   . H/O varicella 12/11/2017  . Hypertension   . 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  . Nicotine dependence, uncomplicated 06/29/735  . Nocturnal hypoxemia 03/04/2016  . PAF (paroxysmal atrial fibrillation) ([place]) 12/10/2016  . Peripheral vascular disease ([place])   . Shortness of breath    USES OXYGEN AT NIGHT--HX OF RIGHT LOWER LOBE PULMONARY NODULE--FOLLOWED BY PT'S MEDICAL DOCTOR AND HAS HAD FOR YEARS  . Smoking greater than 40 pack years 03/04/2016  . Syncope 08/04/2016    Past Surgical History:  Procedure Laterality Date  . ATHERECTOMY  01/31/2020   Successful DFR guided, CSI Orbital Atherectomy-DES PCI of proximal LA  . BACK SURGERY     LOWER BACK SURGERY X 3 - FUSION  . CARPAL TUNNEL RELEASE AND SURGERY LEFT ELBOW    . CATARACT EXTRACTION    . CERVICAL DISC SURGERY     FUSION - ONLY SLIGHT LIMITATION IN NECK MOVEMENT  . CORONARY ATHERECTOMY N/A 01/31/2020   Procedure: CORONARY ATHERECTOMY;  Surgeon: Leonie Man, MD;  Location: [place] [facility];  Service: Cardiovascular;  Laterality: N/A;  . CORONARY ATHERECTOMY N/A 02/08/2020   Procedure: CORONARY ATHERECTOMY;  Surgeon: Leonie Man, MD;  Location: [place] [facility];  Service: Cardiovascular;  Laterality: N/A;  . CORONARY STENT INTERVENTION N/A 01/31/2020   Procedure: CORONARY STENT INTERVENTION;  Surgeon: Leonie Man, MD;   Location: [place] [facility];  Service: Cardiovascular;  Laterality: N/A;  . INTRAVASCULAR PRESSURE WIRE/FFR STUDY N/A 01/31/2020   Procedure: INTRAVASCULAR PRESSURE WIRE/FFR STUDY;  Surgeon: Leonie Man, MD;  Location: [place] [facility];  Service: Cardiovascular;  Laterality: N/A;  . LEFT HEART CATH N/A 02/08/2020   Procedure: Left Heart Cath;  Surgeon: Leonie Man, MD;  Location: [place] [facility];  Service: Cardiovascular;  Laterality: N/A;  . LEFT HEART CATH AND CORONARY ANGIOGRAPHY N/A 01/31/2020   Procedure: LEFT HEART CATH AND CORONARY ANGIOGRAPHY;  Surgeon: Leonie Man, MD;  Location: [place] [facility];  Service: Cardiovascular;  Laterality: N/A;  . POSTERIOR CERVICAL FUSION/FORAMINOTOMY Bilateral 09/30/2012   Procedure: CERVICAL SEVEN AND THORACIC ONE BILATERAL POSTERIOR CERVICAL FUSION/FORAMINOTOMY ;  Surgeon: Eustace Moore, MD;  Location: [place] [facility];  Service: Neurosurgery;  Laterality: Bilateral;  . RIGHT SHOULDER SURGERY    . SINUS SURGERY WITH INSTATRAK    . TOTAL HIP ARTHROPLASTY  04/02/2012   Procedure: TOTAL HIP ARTHROPLASTY ANTERIOR APPROACH;  Surgeon: Lind Guest  Ninfa Linden, MD;  Location: [facility];  Service: Orthopedics;  Laterality: Right;  Right Total Hip Arthroplasty  . VASCULAR SURGERY     STENT PLACEMENT RIGHT LEG AND "ROTOR ROOTER" LEFT LEG    Current Medications: Current Meds  Medication Sig  . albuterol (PROVENTIL HFA;VENTOLIN HFA) 108 (90 Base) MCG/ACT inhaler Inhale 2 puffs into the lungs every 6 (six) hours as needed for wheezing or shortness of breath.   Jearl Klinefelter ELLIPTA 62.5-25 MCG/INH AEPB Inhale 1 puff into the lungs daily.   [name] aspirin EC 81 MG tablet Take 81 mg by mouth daily.   . clopidogrel (PLAVIX) 75 MG tablet Take 1 tablet (75 mg total) by mouth daily.  . cyclobenzaprine (FLEXERIL) 10 MG tablet Take 10 mg by mouth 2 (two) times daily as needed for muscle spasms.   [name] docusate sodium (COLACE) 100 MG capsule Take 100 mg by mouth daily.  .  famotidine (PEPCID) 20 MG tablet Take 20 mg by mouth daily.  . Multiple Vitamin (MULTIVITAMIN) tablet Take 1 tablet by mouth daily.  . nitroGLYCERIN (NITROSTAT) 0.4 MG SL tablet Place 1 tablet (0.4 mg total) under the tongue every 5 (five) minutes as needed for chest pain (up to 3 doses. If taking 3rd dose call 911).  . Omega-3 Fatty Acids (FISH OIL) 1000 MG CAPS Take 1,000 mg by mouth daily.   . OXYGEN Inhale 2 L into the lungs as needed.  . pantoprazole (PROTONIX) 40 MG tablet Take 40 mg by mouth daily.  . pravastatin (PRAVACHOL) 40 MG tablet Take 40 mg by mouth daily.   . vitamin B-12 (CYANOCOBALAMIN) 500 MCG tablet Take 500 mcg by mouth daily.   . vitamin C (ASCORBIC ACID) 500 MG tablet Take 500 mg by mouth daily.  . Vitamin D-Vitamin K (VITAMIN K2-VITAMIN D3 PO) Take 1 tablet by mouth daily.     Allergies:   Ferra-caps [iron] and Streptomycin   Social History   Socioeconomic History  . Marital status: Married    Spouse name: Not on file  . Number of children: Not on file  . Years of education: Not on file  . Highest education level: Not on file  Occupational History  . Not on file  Tobacco Use  . Smoking status: Current Some Day Smoker    Packs/day: 1.00    Years: 55.00    Pack years: 55.00    Types: Cigarettes  . Smokeless tobacco: Former Systems developer    Types: Secondary school teacher  . Vaping Use: Never used  Substance and Sexual Activity  . Alcohol use: Yes    Alcohol/week: 24.0 standard drinks    Types: 24 Cans of beer per week    Comment: 6 BEERS A DAY  . Drug use: No  . Sexual activity: Not on file  Other Topics Concern  . Not on file  Social History Narrative  . Not on file   Social Determinants of Health   Financial Resource Strain: Not on file  Food Insecurity: Not on file  Transportation Needs: Not on file  Physical Activity: Not on file  Stress: Not on file  Social Connections: Not on file     Family History: The patient's family history includes Cancer in  his father; Diabetes in his brother and sister. ROS:   Please see the history of present illness.    All other systems reviewed and are negative.  EKGs/Labs/Other Studies Reviewed:    The following studies were reviewed today:    Recent  Labs: 02/01/2020: BUN 12; Creatinine, Ser 1.05; Hemoglobin 12.8; Platelets 190; Potassium 4.1; Sodium 138  Recent Lipid Panel    Component Value Date/Time   CHOL 156 08/24/2018 1510   TRIG 69 08/24/2018 1510   HDL 62 08/24/2018 1510   CHOLHDL 2.5 08/24/2018 1510   LDLCALC 80 08/24/2018 1510    Physical Exam:    VS:  BP 118/60 (BP Location: Right Arm, Patient Position: Sitting, Cuff Size: Normal)   Pulse 86   Ht 5\' 11"  (1.803 m)   Wt 172 lb 6.4 oz (78.2 kg)   SpO2 93%   BMI 24.04 kg/m     Wt Readings from Last 3 Encounters:  09/04/20 172 lb 6.4 oz (78.2 kg)  02/23/20 163 lb 9.6 oz (74.2 kg)  02/08/20 165 lb (74.8 kg)     GEN: COPD appearance well nourished, well developed in no acute distress HEENT: Normal NECK: No JVD; No carotid bruits LYMPHATICS: No lymphadenopathy CARDIAC: Distant heart sounds RRR, no murmurs, rubs, gallops RESPIRATORY: Diminished breath sounds without rales, wheezing or rhonchi  ABDOMEN: Soft, non-tender, non-distended MUSCULOSKELETAL:  No edema; No deformity  SKIN: Warm and dry NEUROLOGIC:  Alert and oriented x 3 PSYCHIATRIC:  Normal affect    Signed, Shirlee More, MD  09/04/2020 11:18 AM    [place]

## 2020-09-04 ENCOUNTER — Encounter: Payer: Self-pay | Admitting: Cardiology

## 2020-09-04 ENCOUNTER — Other Ambulatory Visit: Payer: Self-pay

## 2020-09-04 ENCOUNTER — Ambulatory Visit (INDEPENDENT_AMBULATORY_CARE_PROVIDER_SITE_OTHER): Payer: Medicare Other | Admitting: Cardiology

## 2020-09-04 VITALS — BP 118/60 | HR 86 | Ht 71.0 in | Wt 172.4 lb

## 2020-09-04 DIAGNOSIS — E785 Hyperlipidemia, unspecified: Secondary | ICD-10-CM

## 2020-09-04 DIAGNOSIS — I714 Abdominal aortic aneurysm, without rupture, unspecified: Secondary | ICD-10-CM

## 2020-09-04 DIAGNOSIS — I25119 Atherosclerotic heart disease of native coronary artery with unspecified angina pectoris: Secondary | ICD-10-CM

## 2020-09-04 DIAGNOSIS — N182 Chronic kidney disease, stage 2 (mild): Secondary | ICD-10-CM

## 2020-09-04 DIAGNOSIS — I129 Hypertensive chronic kidney disease with stage 1 through stage 4 chronic kidney disease, or unspecified chronic kidney disease: Secondary | ICD-10-CM

## 2020-09-04 DIAGNOSIS — J449 Chronic obstructive pulmonary disease, unspecified: Secondary | ICD-10-CM

## 2020-09-04 NOTE — Patient Instructions (Signed)
Medication Instructions:  Your physician recommends that you continue on your current medications as directed. Please refer to the Current Medication list given to you today.  *If you need a refill on your cardiac medications before your next appointment, please call your pharmacy*   Lab Work: None  If you have labs (blood work) drawn today and your tests are completely normal, you will receive your results only by: . MyChart Message (if you have MyChart) OR . A paper copy in the mail If you have any lab test that is abnormal or we need to change your treatment, we will call you to review the results.   Testing/Procedures: Your physician has requested that you have an abdominal aorta duplex. During this test, an ultrasound is used to evaluate the aorta. Allow 30 minutes for this exam. Do not eat after midnight the day before and avoid carbonated beverages   Follow-Up: At [facility], you and your health needs are our priority.  As part of our continuing mission to provide you with exceptional heart care, we have created designated Provider Care Teams.  These Care Teams include your primary Cardiologist (physician) and Advanced Practice Providers (APPs -  Physician Assistants and Nurse Practitioners) who all work together to provide you with the care you need, when you need it.  We recommend signing up for the patient portal called "MyChart".  Sign up information is provided on this After Visit Summary.  MyChart is used to connect with patients for Virtual Visits (Telemedicine).  Patients are able to view lab/test results, encounter notes, upcoming appointments, etc.  Non-urgent messages can be sent to your provider as well.   To learn more about what you can do with MyChart, go to [email or web address].    Your next appointment:   6 month(s)  The format for your next appointment:   In Person  Provider:   Brian Munley, MD   Other Instructions   

## 2020-09-27 ENCOUNTER — Ambulatory Visit (INDEPENDENT_AMBULATORY_CARE_PROVIDER_SITE_OTHER): Payer: Medicare Other

## 2020-09-27 ENCOUNTER — Other Ambulatory Visit: Payer: Self-pay

## 2020-09-27 DIAGNOSIS — I714 Abdominal aortic aneurysm, without rupture, unspecified: Secondary | ICD-10-CM

## 2020-09-27 NOTE — Progress Notes (Signed)
Abdominal aortic duplex exam performed.  Jimmy Hendrix Console RDCS, RVT 

## 2020-10-04 ENCOUNTER — Telehealth: Payer: Self-pay

## 2020-10-04 DIAGNOSIS — I714 Abdominal aortic aneurysm, without rupture, unspecified: Secondary | ICD-10-CM

## 2020-10-04 NOTE — Telephone Encounter (Signed)
Spoke with patient regarding results and recommendation.  Patient verbalizes understanding and is agreeable to plan of care. Advised patient to call back with any issues or concerns.  

## 2020-10-04 NOTE — Telephone Encounter (Signed)
-----   Message from Richardo Priest, MD sent at 10/04/2020  9:31 AM EDT ----- Similar and stable to CT scan July 2021 with abdominal aortic aneurysm not severe.  Lets plan appropriate recall and interval orders for follow-up duplex next year.

## 2021-03-07 ENCOUNTER — Ambulatory Visit (INDEPENDENT_AMBULATORY_CARE_PROVIDER_SITE_OTHER): Payer: Medicare Other | Admitting: Cardiology

## 2021-03-07 ENCOUNTER — Other Ambulatory Visit: Payer: Self-pay

## 2021-03-07 ENCOUNTER — Encounter: Payer: Self-pay | Admitting: Cardiology

## 2021-03-07 VITALS — BP 120/72 | HR 84 | Ht 71.0 in | Wt 167.0 lb

## 2021-03-07 DIAGNOSIS — E785 Hyperlipidemia, unspecified: Secondary | ICD-10-CM

## 2021-03-07 DIAGNOSIS — I25119 Atherosclerotic heart disease of native coronary artery with unspecified angina pectoris: Secondary | ICD-10-CM | POA: Diagnosis not present

## 2021-03-07 DIAGNOSIS — I129 Hypertensive chronic kidney disease with stage 1 through stage 4 chronic kidney disease, or unspecified chronic kidney disease: Secondary | ICD-10-CM

## 2021-03-07 DIAGNOSIS — I10 Essential (primary) hypertension: Secondary | ICD-10-CM | POA: Diagnosis not present

## 2021-03-07 DIAGNOSIS — J449 Chronic obstructive pulmonary disease, unspecified: Secondary | ICD-10-CM

## 2021-03-07 DIAGNOSIS — N182 Chronic kidney disease, stage 2 (mild): Secondary | ICD-10-CM

## 2021-03-07 MED ORDER — PRAVASTATIN SODIUM 40 MG PO TABS: 40.0000 mg | ORAL_TABLET | Freq: Every day | ORAL | 3 refills | Status: AC

## 2021-03-07 MED ORDER — EZETIMIBE 10 MG PO TABS: 10.0000 mg | ORAL_TABLET | Freq: Every day | ORAL | 3 refills | Status: DC

## 2021-03-07 MED ORDER — AMLODIPINE BESYLATE 5 MG PO TABS: 5.0000 mg | ORAL_TABLET | Freq: Every day | ORAL | 3 refills | Status: DC

## 2021-03-07 MED ORDER — CLOPIDOGREL BISULFATE 75 MG PO TABS
75.0000 mg | ORAL_TABLET | Freq: Every day | ORAL | 3 refills | Status: DC
Start: 2021-03-07 — End: 2024-05-03

## 2021-03-07 NOTE — Progress Notes (Signed)
Cardiology Office Note:    Date:  03/07/2021   ID:  Corey Tate, Corey Tate 1940/07/03, MRN [id number]  PCP:  Imagene Riches, NP  Cardiologist:  Shirlee More, MD    Referring MD: Imagene Riches, NP    ASSESSMENT:    1. Coronary artery disease involving native coronary artery of native heart with angina pectoris ([place])   2. 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 Hypertension, unspecified type   4. Dyslipidemia   5. COPD, severe ([place])    PLAN:    In order of problems listed above:  Stable CAD he is having no angina after complex PCI and stent and on current medical therapy including his dual antiplatelet also has PAD lipid-lowering combined statin 70 and calcium channel blocker.  At this time I would not advise any ischemic evaluation BP at target continue current treatment await recent labs for renal function Lipids at target continue combined lipid-lowering therapy Stable COPD continue his bronchodilator   Next appointment: 1year     Medication Adjustments/Labs and Tests Ordered: Current medicines are reviewed at length with the patient today.  Concerns regarding medicines are outlined above.  Orders Placed This Encounter  Procedures   EKG 12-Lead   Meds ordered this encounter  Medications   ezetimibe (ZETIA) 10 MG tablet    Sig: Take 1 tablet (10 mg total) by mouth daily.    Dispense:  90 tablet    Refill:  3   pravastatin (PRAVACHOL) 40 MG tablet    Sig: Take 1 tablet (40 mg total) by mouth daily.    Dispense:  90 tablet    Refill:  3   amLODipine (NORVASC) 5 MG tablet    Sig: Take 1 tablet (5 mg total) by mouth daily.    Dispense:  90 tablet    Refill:  3   clopidogrel (PLAVIX) 75 MG tablet    Sig: Take 1 tablet (75 mg total) by mouth daily.    Dispense:  90 tablet    Refill:  3    No chief complaint on file.   History of Present Illness:    Corey Tate is a 80 y.o. male with a hx of CAD with staged PCI and stent to the LAD  and left circumflex coronary artery August 2021 severe COPD hypertensive kidney disease stage II CKD dyslipidemia and abdominal aortic aneurysm last seen 08/25/2020 he also has cerebrovascular disease withCTA head neck 12/05/2019 which shows severe atherosclerosis aortic arch head and neck right vertebral artery is occluded proximal right internal carotid artery tandem 60 to 65% stenosis stable severe stenosis left vertebral artery and also heavy intracranial calcification bilaterally of the ICA siphon.  Report from 2018 is similar except the right vertebral artery was not occluded at that time.  I do not see any previous evaluation of abdominal aorta in care everywhere. Carotid duplex performed 11/25/2019 showed  less than 50% stenosis on the right 50 to 69% stenosis on the left. He also had CT angio of the abdomen showed a 3.5 centimeter infrarenal abdominal aortic aneurysm bilateral renal artery stenosis and left hydronephrosis.  He had duplex abdominal aorta also performed 04/072022 showing stable aortic aneurysm 37 mm.  Compliance with diet, lifestyle and medications: Yes  His predominant problem is chronic back pain. He relates he had labs done last month at his PCP office and requested a copy.  Most recent lipid profile 08/27/2020 showed LDL at target 80 cholesterol 156  HDL 62 triglycerides 69. He has had no angina palpitations syncope is chronic shortness of breath more than usual activities related to his underlying lung disease no symptoms of TIA. Past Medical History:  Diagnosis Date   Anxiety 12/11/2017   Arthritis    Asthma    ?   Atherosclerosis of native artery of both lower extremities ([place]) 01/31/2016   Atrial flutter by electrocardiogram ([place]) 12/10/2016   Bilateral carotid artery stenosis 01/31/2016   CAD (coronary artery disease)    a. 01/2020- DFR-guided CSI orbital atherectomy with DES PCI of proximal LAD with staged orbital atherectomy and DES PCI of the mid and distal LCx.    Cancer ([place])    SKIN CANCERS   Carotid bruit 12/11/2017   CKD (chronic kidney disease), stage II 02/08/2020   COPD, severe ([place]) 03/04/2016   Coronary artery disease involving native coronary artery of native heart with angina pectoris ([place]) 01/31/2020   Cough 03/04/2016   Degenerative arthritis of hip 04/02/2012   DOE (dyspnea on exertion)  - as Angina Equivalent 01/31/2020   Dyslipidemia 09/26/2015   GERD (gastroesophageal reflux disease)    H/O varicella 12/11/2017   Hypertension    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   Nicotine dependence, uncomplicated 0000000   Nocturnal hypoxemia 03/04/2016   PAF (paroxysmal atrial fibrillation) ([place]) 12/10/2016   Peripheral vascular disease ([facility])    Shortness of breath    USES OXYGEN AT NIGHT--HX OF RIGHT LOWER LOBE PULMONARY NODULE--FOLLOWED BY PT'S MEDICAL DOCTOR AND HAS HAD FOR YEARS   Smoking greater than 40 pack years 03/04/2016   Syncope 08/04/2016    Past Surgical History:  Procedure Laterality Date   ATHERECTOMY  01/31/2020   Successful DFR guided, CSI Orbital Atherectomy-DES PCI of proximal LA   BACK SURGERY     LOWER BACK SURGERY X 3 - FUSION   CARPAL TUNNEL RELEASE AND SURGERY LEFT ELBOW     CATARACT EXTRACTION     CERVICAL DISC SURGERY     FUSION - ONLY SLIGHT LIMITATION IN NECK MOVEMENT   CORONARY ATHERECTOMY N/A 01/31/2020   Procedure: CORONARY ATHERECTOMY;  Surgeon: Leonie Man, MD;  Location: [facility];  Service: Cardiovascular;  Laterality: N/A;   CORONARY ATHERECTOMY N/A 02/08/2020   Procedure: CORONARY ATHERECTOMY;  Surgeon: Leonie Man, MD;  Location: [facility];  Service: Cardiovascular;  Laterality: N/A;   CORONARY STENT INTERVENTION N/A 01/31/2020   Procedure: CORONARY STENT INTERVENTION;  Surgeon: Leonie Man, MD;  Location: [facility];  Service: Cardiovascular;  Laterality: N/A;   INTRAVASCULAR PRESSURE WIRE/FFR STUDY N/A 01/31/2020   Procedure: INTRAVASCULAR PRESSURE  WIRE/FFR STUDY;  Surgeon: Leonie Man, MD;  Location: [facility];  Service: Cardiovascular;  Laterality: N/A;   LEFT HEART CATH N/A 02/08/2020   Procedure: Left Heart Cath;  Surgeon: Leonie Man, MD;  Location: [facility];  Service: Cardiovascular;  Laterality: N/A;   LEFT HEART CATH AND CORONARY ANGIOGRAPHY N/A 01/31/2020   Procedure: LEFT HEART CATH AND CORONARY ANGIOGRAPHY;  Surgeon: Leonie Man, MD;  Location: [facility];  Service: Cardiovascular;  Laterality: N/A;   POSTERIOR CERVICAL FUSION/FORAMINOTOMY Bilateral 09/30/2012   Procedure: CERVICAL SEVEN AND THORACIC ONE BILATERAL POSTERIOR CERVICAL FUSION/FORAMINOTOMY ;  Surgeon: Eustace Moore, MD;  Location: [facility];  Service: Neurosurgery;  Laterality: Bilateral;   RIGHT SHOULDER SURGERY     SINUS SURGERY WITH INSTATRAK     TOTAL HIP ARTHROPLASTY  04/02/2012   Procedure: TOTAL HIP ARTHROPLASTY ANTERIOR APPROACH;  Surgeon: Mcarthur Rossetti, MD;  Location: [facility];  Service: Orthopedics;  Laterality: Right;  Right Total Hip Arthroplasty   VASCULAR SURGERY     STENT PLACEMENT RIGHT LEG AND "ROTOR ROOTER" LEFT LEG    Current Medications: Current Meds  Medication Sig   albuterol (PROVENTIL HFA;VENTOLIN HFA) 108 (90 Base) MCG/ACT inhaler Inhale 2 puffs into the lungs every 6 (six) hours as needed for wheezing or shortness of breath.    ANORO ELLIPTA 62.5-25 MCG/INH AEPB Inhale 1 puff into the lungs daily.    aspirin EC 81 MG tablet Take 81 mg by mouth daily.    cyclobenzaprine (FLEXERIL) 10 MG tablet Take 10 mg by mouth 2 (two) times daily as needed for muscle spasms.    docusate sodium (COLACE) 100 MG capsule Take 100 mg by mouth daily.   famotidine (PEPCID) 20 MG tablet Take 20 mg by mouth daily.   Multiple Vitamin (MULTIVITAMIN) tablet Take 1 tablet by mouth daily.   nitroGLYCERIN (NITROSTAT) 0.4 MG SL tablet Place 1 tablet (0.4 mg total) under the tongue every 5 (five) minutes as needed for  chest pain (up to 3 doses. If taking 3rd dose call 911).   Omega-3 Fatty Acids (FISH OIL) 1000 MG CAPS Take 1,000 mg by mouth daily.    OXYGEN Inhale 2 L into the lungs as needed.   pantoprazole (PROTONIX) 40 MG tablet Take 40 mg by mouth daily.   vitamin B-12 (CYANOCOBALAMIN) 500 MCG tablet Take 500 mcg by mouth daily.    vitamin C (ASCORBIC ACID) 500 MG tablet Take 500 mg by mouth daily.   Vitamin D-Vitamin K (VITAMIN K2-VITAMIN D3 PO) Take 1 tablet by mouth daily.   [DISCONTINUED] amLODipine (NORVASC) 5 MG tablet Take 1 tablet (5 mg total) by mouth daily.   [DISCONTINUED] clopidogrel (PLAVIX) 75 MG tablet Take 1 tablet (75 mg total) by mouth daily.   [DISCONTINUED] ezetimibe (ZETIA) 10 MG tablet Take 1 tablet (10 mg total) by mouth daily.   [DISCONTINUED] pravastatin (PRAVACHOL) 40 MG tablet Take 40 mg by mouth daily.      Allergies:   Ferra-caps [iron] and Streptomycin   Social History   Socioeconomic History   Marital status: Married    Spouse name: Not on file   Number of children: Not on file   Years of education: Not on file   Highest education level: Not on file  Occupational History   Not on file  Tobacco Use   Smoking status: Some Days    Packs/day: 1.00    Years: 55.00    Pack years: 55.00    Types: Cigarettes   Smokeless tobacco: Former    Types: Nurse, children's Use: Never used  Substance and Sexual Activity   Alcohol use: Yes    Alcohol/week: 24.0 standard drinks    Types: 24 Cans of beer per week    Comment: 6 BEERS A DAY   Drug use: No   Sexual activity: Not on file  Other Topics Concern   Not on file  Social History Narrative   Not on file   Social Determinants of Health   Financial Resource Strain: Not on file  Food Insecurity: Not on file  Transportation Needs: Not on file  Physical Activity: Not on file  Stress: Not on file  Social Connections: Not on file     Family History: The patient's family history includes Cancer in  his  father; Diabetes in his brother and sister. ROS:   Please see the history of present illness.    All other systems reviewed and are negative.  EKGs/Labs/Other Studies Reviewed:    The following studies were reviewed today:  EKG:  EKG ordered today and personally reviewed.  The ekg ordered today demonstrates sinus rhythm first-degree AV block 1 PVC otherwise normal  Recent Labs: No results found for requested labs within last 8760 hours.  Recent Lipid Panel    Component Value Date/Time   CHOL 156 08/24/2018 1510   TRIG 69 08/24/2018 1510   HDL 62 08/24/2018 1510   CHOLHDL 2.5 08/24/2018 1510   LDLCALC 80 08/24/2018 1510    Physical Exam:    VS:  BP 120/72 (BP Location: Right Arm, Patient Position: Sitting, Cuff Size: Normal)   Pulse 84   Ht '5\' 11"'$  (1.803 m)   Wt 167 lb (75.8 kg)   SpO2 97%   BMI 23.29 kg/m     Wt Readings from Last 3 Encounters:  03/07/21 167 lb (75.8 kg)  09/04/20 172 lb 6.4 oz (78.2 kg)  02/23/20 163 lb 9.6 oz (74.2 kg)     GEN: COPD appearance well nourished, well developed in no acute distress HEENT: Normal NECK: No JVD; No carotid bruits LYMPHATICS: No lymphadenopathy CARDIAC: Distant heart sounds RRR, no murmurs, rubs, gallops RESPIRATORY:  Clear to auscultation without rales, wheezing or rhonchi  ABDOMEN: Soft, non-tender, non-distended MUSCULOSKELETAL:  No edema; No deformity  SKIN: Warm and dry NEUROLOGIC:  Alert and oriented x 3 PSYCHIATRIC:  Normal affect    Signed, Shirlee More, MD  03/07/2021 1:26 PM    [facility]

## 2021-03-07 NOTE — Patient Instructions (Signed)

## 2022-04-04 ENCOUNTER — Ambulatory Visit: Payer: Medicare Other | Admitting: Cardiology

## 2022-05-28 ENCOUNTER — Telehealth: Payer: Self-pay

## 2022-05-28 NOTE — Telephone Encounter (Signed)
   Name: Corey Tate  DOB: February 19, 1941  MRN: [id number]  Primary Cardiologist: Shirlee More, MD  Chart reviewed as part of pre-operative protocol coverage. The patient has an upcoming visit scheduled with Dr. Bettina Gavia on 06/10/2022 at which time clearance can be addressed in case there are any issues that would impact surgical recommendations.  L2-3, L3-4, L4-5 Posterolateral fusion with pedicle screws is not scheduled until TBD as below. I added preop FYI to appointment note so that provider is aware to address at time of outpatient visit.  Per office protocol the cardiology provider should forward their finalized clearance decision and recommendations regarding antiplatelet therapy to the requesting party below.    I will route this message as FYI to requesting party and remove this message from the preop box as separate preop APP input not needed at this time.   Please call with any questions.  Lenna Sciara, NP  05/28/2022, 10:43 AM

## 2022-05-28 NOTE — Telephone Encounter (Signed)
   Pre-operative Risk Assessment    Patient Name: Corey Tate  DOB: [birth year] MRN: [id number]      Request for Surgical Clearance    Procedure:   L2-3, L3-4, L4-5 Posterolateral Fusion w/ pedicle screws  Date of Surgery:  Clearance TBD                                 Surgeon:  Eustace Moore, MD Surgeon's Group or Practice Name:  [facility] Phone number:  [phone] Fax number:  [phone]   Type of Clearance Requested:   - Medical Medical and Pharmacy   Type of Anesthesia:  General    Additional requests/questions:  Please fax a copy of request form with office notes  to the surgeon's office.  Signed, Toni Arthurs   05/28/2022, 7:38 AM

## 2022-05-30 ENCOUNTER — Ambulatory Visit: Payer: Medicare Other | Admitting: Cardiology

## 2022-06-10 ENCOUNTER — Encounter: Payer: Self-pay | Admitting: Cardiology

## 2022-06-10 ENCOUNTER — Ambulatory Visit: Payer: Medicare Other | Attending: Cardiology | Admitting: Cardiology

## 2022-06-10 VITALS — BP 128/70 | HR 87 | Ht 71.0 in | Wt 156.0 lb

## 2022-06-10 DIAGNOSIS — I6523 Occlusion and stenosis of bilateral carotid arteries: Secondary | ICD-10-CM | POA: Diagnosis present

## 2022-06-10 DIAGNOSIS — I129 Hypertensive chronic kidney disease with stage 1 through stage 4 chronic kidney disease, or unspecified chronic kidney disease: Secondary | ICD-10-CM | POA: Insufficient documentation

## 2022-06-10 DIAGNOSIS — J449 Chronic obstructive pulmonary disease, unspecified: Secondary | ICD-10-CM | POA: Insufficient documentation

## 2022-06-10 DIAGNOSIS — E785 Hyperlipidemia, unspecified: Secondary | ICD-10-CM | POA: Insufficient documentation

## 2022-06-10 DIAGNOSIS — I7142 Juxtarenal abdominal aortic aneurysm, without rupture: Secondary | ICD-10-CM | POA: Diagnosis present

## 2022-06-10 DIAGNOSIS — N182 Chronic kidney disease, stage 2 (mild): Secondary | ICD-10-CM | POA: Insufficient documentation

## 2022-06-10 DIAGNOSIS — I25119 Atherosclerotic heart disease of native coronary artery with unspecified angina pectoris: Secondary | ICD-10-CM | POA: Insufficient documentation

## 2022-06-10 NOTE — Progress Notes (Signed)
Cardiology Office Note:    Date:  06/10/2022   ID:  Corey Tate, Corey Tate July 08, 1940, MRN [id number]  PCP:  Rhea Bleacher, NP  Cardiologist:  Shirlee More, MD    Referring MD: Rhea Bleacher, NP    ASSESSMENT:    1. Coronary artery disease involving native coronary artery of native heart with angina pectoris ([place])   2. COPD, severe ([place])   3. 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 Dyslipidemia   5. 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 Bilateral carotid artery stenosis    PLAN:    In order of problems listed above:  He has stable chronic CAD and does not have a history of bradycardia pacemaker atrial fibrillation heart failure.  My perspective is he is optimized for his planned surgical procedure he can withdraw dual antiplatelet therapy normally prior to surgery reinstitute aspirin within 24 hours and clopidogrel when safe postoperatively from the perspective however I do think there needs to be communication with his vascular surgeons after carotid surgery. Stable COPD continues to smoke Stable most recent renal function is preserved blood pressure is at target on amlodipine we will continue that.. His vascular disease is now followed by [facility] his dual antiplatelet therapy I will reach out and see if I can access labs from his PCP office   Next appointment: 6 months   Medication Adjustments/Labs and Tests Ordered: Current medicines are reviewed at length with the patient today.  Concerns regarding medicines are outlined above.  Orders Placed This Encounter  Procedures   EKG 12-Lead   No orders of the defined types were placed in this encounter.   Chief Complaint  Patient presents with   Follow-up   Coronary Artery Disease   Pre-op Exam    History of Present Illness:    Corey Tate is a 81 y.o. male with a hx of CAD with staged PCI and stent to LAD and left circumflex coronary  artery August 2021 severe COPD hypertensive kidney disease with stage II CKD dyslipidemia abdominal aortic aneurysm and cerebrovascular disease with a CTA performed in June 2021 showing severe atherosclerosis in the aortic arch the right vertebral artery was occluded proximally right internal carotid artery 60 to 65% stenosis stable severe stenosis left vertebral artery and also heavy intracranial calcifications bilaterally with the ICA siphon.  His last abdominal aortic duplex April 2022 showed stable 37 mm sometimes.  He was last seen 03/07/2021.  While he underwent left carotid endarterectomy [place] on 03/06/2022 with an unremarkable hospital course.  Presently he is being followed for his abdominal aortic aneurysm lower extremity peripheral vascular disease and carotid disease had vascular surgery at [facility].  Recent labs 03/07/2022 sodium 136 potassium 4.0 GFR 78 cc/min hemoglobin 10.5 platelets 157000  Compliance with diet, lifestyle and medications: Yes  He made a good recovery from his carotid surgery but is very limited by radicular pain low back, right lower extremity there is a note from Dr. Sherley Bounds neurosurgery for anticipated spine surgery under general anesthesia.  From a cardiology perspective he has done well he has no anginal discomfort does not have a history of heart failure is stable chronic CAD Unfortunately his exercise tolerance is limited by his back pain and cannot be determined He continues to smoke I will reach out to access labs from his PCP he seems to be on good medical therapy with  antiplatelet that he can withdrawal preoperatively and postoperatively start aspirin today 1 today to and clopidogrel when safe from a bleeding perspective He will need to be monitored for the first 24 hours and EKG postoperative day 1 Past Medical History:  Diagnosis Date   Anxiety 12/11/2017   Arthritis    Asthma    ?   Atherosclerosis of native artery  of both lower extremities ([place]) 01/31/2016   Atrial flutter by electrocardiogram ([place]) 12/10/2016   Bilateral carotid artery stenosis 01/31/2016   CAD (coronary artery disease)    a. 01/2020- DFR-guided CSI orbital atherectomy with DES PCI of proximal LAD with staged orbital atherectomy and DES PCI of the mid and distal LCx.   Cancer ([place])    SKIN CANCERS   Carotid bruit 12/11/2017   CKD (chronic kidney disease), stage II 02/08/2020   COPD, severe ([place]) 03/04/2016   Coronary artery disease involving native coronary artery of native heart with angina pectoris ([place]) 01/31/2020   Cough 03/04/2016   Degenerative arthritis of hip 04/02/2012   DOE (dyspnea on exertion)  - as Angina Equivalent 01/31/2020   Dyslipidemia 09/26/2015   GERD (gastroesophageal reflux disease)    H/O varicella 12/11/2017   Hypertension    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   Nicotine dependence, uncomplicated 07/02/5091   Nocturnal hypoxemia 03/04/2016   PAF (paroxysmal atrial fibrillation) ([place]) 12/10/2016   Peripheral vascular disease (HCC)    Shortness of breath    USES OXYGEN AT NIGHT--HX OF RIGHT LOWER LOBE PULMONARY NODULE--FOLLOWED BY PT'S MEDICAL DOCTOR AND HAS HAD FOR YEARS   Smoking greater than 40 pack years 03/04/2016   Syncope 08/04/2016    Past Surgical History:  Procedure Laterality Date   ATHERECTOMY  01/31/2020   Successful DFR guided, CSI Orbital Atherectomy-DES PCI of proximal LA   BACK SURGERY     LOWER BACK SURGERY X 3 - FUSION   CARPAL TUNNEL RELEASE AND SURGERY LEFT ELBOW     CATARACT EXTRACTION     CERVICAL DISC SURGERY     FUSION - ONLY SLIGHT LIMITATION IN NECK MOVEMENT   CORONARY ATHERECTOMY N/A 01/31/2020   Procedure: CORONARY ATHERECTOMY;  Surgeon: Leonie Man, MD;  Location: [place] [facility];  Service: Cardiovascular;  Laterality: N/A;   CORONARY ATHERECTOMY N/A 02/08/2020   Procedure: CORONARY ATHERECTOMY;  Surgeon: Leonie Man, MD;  Location: [place] [facility];   Service: Cardiovascular;  Laterality: N/A;   CORONARY STENT INTERVENTION N/A 01/31/2020   Procedure: CORONARY STENT INTERVENTION;  Surgeon: Leonie Man, MD;  Location: [place] [facility];  Service: Cardiovascular;  Laterality: N/A;   INTRAVASCULAR PRESSURE WIRE/FFR STUDY N/A 01/31/2020   Procedure: INTRAVASCULAR PRESSURE WIRE/FFR STUDY;  Surgeon: Leonie Man, MD;  Location: [place] [facility];  Service: Cardiovascular;  Laterality: N/A;   LEFT HEART CATH N/A 02/08/2020   Procedure: Left Heart Cath;  Surgeon: Leonie Man, MD;  Location: [place] [facility];  Service: Cardiovascular;  Laterality: N/A;   LEFT HEART CATH AND CORONARY ANGIOGRAPHY N/A 01/31/2020   Procedure: LEFT HEART CATH AND CORONARY ANGIOGRAPHY;  Surgeon: Leonie Man, MD;  Location: [place] [facility];  Service: Cardiovascular;  Laterality: N/A;   POSTERIOR CERVICAL FUSION/FORAMINOTOMY Bilateral 09/30/2012   Procedure: CERVICAL SEVEN AND THORACIC ONE BILATERAL POSTERIOR CERVICAL FUSION/FORAMINOTOMY ;  Surgeon: Eustace Moore, MD;  Location: [place] [facility];  Service: Neurosurgery;  Laterality: Bilateral;   RIGHT SHOULDER SURGERY     SINUS SURGERY WITH INSTATRAK  TOTAL HIP ARTHROPLASTY  04/02/2012   Procedure: TOTAL HIP ARTHROPLASTY ANTERIOR APPROACH;  Surgeon: Mcarthur Rossetti, MD;  Location: [facility];  Service: Orthopedics;  Laterality: Right;  Right Total Hip Arthroplasty   VASCULAR SURGERY     STENT PLACEMENT RIGHT LEG AND "ROTOR ROOTER" LEFT LEG    Current Medications: Current Meds  Medication Sig   albuterol (PROVENTIL HFA;VENTOLIN HFA) 108 (90 Base) MCG/ACT inhaler Inhale 2 puffs into the lungs every 6 (six) hours as needed for wheezing or shortness of breath.    amLODipine (NORVASC) 5 MG tablet Take 1 tablet (5 mg total) by mouth daily.   ANORO ELLIPTA 62.5-25 MCG/INH AEPB Inhale 1 puff into the lungs daily.    aspirin EC 81 MG tablet Take 81 mg by mouth daily.    cilostazol (PLETAL) 100 MG tablet Take  100 mg by mouth 2 (two) times daily.   clopidogrel (PLAVIX) 75 MG tablet Take 1 tablet (75 mg total) by mouth daily.   cyclobenzaprine (FLEXERIL) 10 MG tablet Take 10 mg by mouth 2 (two) times daily as needed for muscle spasms.    docusate sodium (COLACE) 100 MG capsule Take 100 mg by mouth daily.   ezetimibe (ZETIA) 10 MG tablet Take 1 tablet (10 mg total) by mouth daily.   famotidine (PEPCID) 20 MG tablet Take 20 mg by mouth daily.   lansoprazole (PREVACID) 15 MG capsule Take 15 mg by mouth daily.   Multiple Vitamin (MULTIVITAMIN) tablet Take 1 tablet by mouth daily.   nitroGLYCERIN (NITROSTAT) 0.4 MG SL tablet Place 1 tablet (0.4 mg total) under the tongue every 5 (five) minutes as needed for chest pain (up to 3 doses. If taking 3rd dose call 911).   Omega-3 Fatty Acids (FISH OIL) 1000 MG CAPS Take 1,000 mg by mouth daily.    OXYGEN Inhale 2 L into the lungs as needed.   pantoprazole (PROTONIX) 40 MG tablet Take 40 mg by mouth daily.   pravastatin (PRAVACHOL) 40 MG tablet Take 1 tablet (40 mg total) by mouth daily.   vitamin B-12 (CYANOCOBALAMIN) 500 MCG tablet Take 500 mcg by mouth daily.    vitamin C (ASCORBIC ACID) 500 MG tablet Take 500 mg by mouth daily.   Vitamin D-Vitamin K (VITAMIN K2-VITAMIN D3 PO) Take 1 tablet by mouth daily.     Allergies:   Ferra-caps [iron] and Streptomycin   Social History   Socioeconomic History   Marital status: Married    Spouse name: Not on file   Number of children: Not on file   Years of education: Not on file   Highest education level: Not on file  Occupational History   Not on file  Tobacco Use   Smoking status: Some Days    Packs/day: 1.00    Years: 55.00    Total pack years: 55.00    Types: Cigarettes   Smokeless tobacco: Former    Types: Nurse, children's Use: Never used  Substance and Sexual Activity   Alcohol use: Yes    Alcohol/week: 24.0 standard drinks of alcohol    Types: 24 Cans of beer per week    Comment: 6  BEERS A DAY   Drug use: No   Sexual activity: Not on file  Other Topics Concern   Not on file  Social History Narrative   Not on file   Social Determinants of Health   Financial Resource Strain: Not on file  Food Insecurity: Not on file  Transportation Needs: Not on file  Physical Activity: Not on file  Stress: Not on file  Social Connections: Not on file     Family History: The patient's family history includes Cancer in his father; Diabetes in his brother and sister. ROS:   Please see the history of present illness.    All other systems reviewed and are negative.  EKGs/Labs/Other Studies Reviewed:    The following studies were reviewed today:  EKG:  EKG ordered today and personally reviewed.  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 unchanged from September 2022 except he no longer has first-degree AV block  Recent Labs: Available [facility] 03/07/2022 creatinine 0.98 GFR 78 cc sodium 136 potassium 4.0 hemoglobin 10.5 Recent Lipid Panel    Component Value Date/Time   CHOL 156 08/24/2018 1510   TRIG 69 08/24/2018 1510   HDL 62 08/24/2018 1510   CHOLHDL 2.5 08/24/2018 1510   LDLCALC 80 08/24/2018 1510    Physical Exam:    VS:  BP 128/70 (BP Location: Right Arm, Patient Position: Sitting)   Pulse 87   Ht '5\' 11"'$  (1.803 m)   Wt 156 lb (70.8 kg)   SpO2 97%   BMI 21.76 kg/m     Wt Readings from Last 3 Encounters:  06/10/22 156 lb (70.8 kg)  03/07/21 167 lb (75.8 kg)  09/04/20 172 lb 6.4 oz (78.2 kg)     GEN: He has a COPD appearance well nourished, well developed in no acute distress HEENT: Normal NECK: No JVD; No carotid bruits LYMPHATICS: No lymphadenopathy CARDIAC: Distant heart sounds RRR, no murmurs, rubs, gallops RESPIRATORY: Hyperinflated diminished breath sounds prolonged expiration but no rhonchi or wheezing ABDOMEN: Soft, non-tender, non-distended MUSCULOSKELETAL:  No edema; No  deformity  SKIN: Warm and dry NEUROLOGIC:  Alert and oriented x 3 PSYCHIATRIC:  Normal affect    Signed, Shirlee More, MD  06/10/2022 4:45 PM    [facility]

## 2022-06-10 NOTE — Patient Instructions (Signed)
Medication Instructions:  Your physician recommends that you continue on your current medications as directed. Please refer to the Current Medication list given to you today.  *If you need a refill on your cardiac medications before your next appointment, please call your pharmacy*   Lab Work: Will request labs from PCP If you have labs (blood work) drawn today and your tests are completely normal, you will receive your results only by: [place] (if you have MyChart) OR A paper copy in the mail If you have any lab test that is abnormal or we need to change your treatment, we will call you to review the results.   Testing/Procedures: NONE   Follow-Up: At [facility], you and your health needs are our priority.  As part of our continuing mission to provide you with exceptional heart care, we have created designated Provider Care Teams.  These Care Teams include your primary Cardiologist (physician) and Advanced Practice Providers (APPs -  Physician Assistants and Nurse Practitioners) who all work together to provide you with the care you need, when you need it.  We recommend signing up for the patient portal called "MyChart".  Sign up information is provided on this After Visit Summary.  MyChart is used to connect with patients for Virtual Visits (Telemedicine).  Patients are able to view lab/test results, encounter notes, upcoming appointments, etc.  Non-urgent messages can be sent to your provider as well.   To learn more about what you can do with MyChart, go to [email or web address].    Your next appointment:   6 month(s)  The format for your next appointment:   In Person  Provider:   Shirlee More, MD    Other Instructions   Important Information About Sugar

## 2022-07-02 LAB — LAB REPORT - SCANNED: EGFR (Non-African Amer.): 46.9

## 2022-07-15 DIAGNOSIS — R221 Localized swelling, mass and lump, neck: Secondary | ICD-10-CM | POA: Insufficient documentation

## 2022-07-15 DIAGNOSIS — L723 Sebaceous cyst: Secondary | ICD-10-CM | POA: Insufficient documentation

## 2022-07-15 HISTORY — DX: Localized swelling, mass and lump, neck: R22.1

## 2022-07-15 HISTORY — DX: Sebaceous cyst: L72.3

## 2022-09-24 ENCOUNTER — Other Ambulatory Visit: Payer: Self-pay | Admitting: Neurological Surgery

## 2022-10-13 NOTE — Pre-Procedure Instructions (Addendum)
Surgical Instructions    Your procedure is scheduled on Monday, October 20, 2022 at 7:30 AM.  Report to [facility] Main Entrance "A" at 5:30 A.M., then check in with the Admitting office.  Call this number if you have problems the morning of surgery:  [phone]   If you have any questions prior to your surgery date call [phone]: Open Monday-Friday 8am-4pm  *If you experience any cold or flu symptoms such as cough, fever, chills, shortness of breath, etc. between now and your scheduled surgery, please notify us.*    Remember:  Do not eat after midnight the night before your surgery  You may drink clear liquids until 4:30 AM the morning of your surgery.   Clear liquids allowed are: Water, Non-Citrus Juices (without pulp), Carbonated Beverages, Clear Tea, Black Coffee Only (NO MILK, CREAM OR POWDERED CREAMER of any kind), and Gatorade.    Take these medicines the morning of surgery with A SIP OF WATER:  amLODipine (NORVASC)  ANORO ELLIPTA  pravastatin (PRAVACHOL)   IF NEEDED: albuterol (PROVENTIL HFA;VENTOLIN HFA) cyclobenzaprine (FLEXERIL)   Please bring all inhalers with you the day of surgery.   Follow your surgeon's instructions on when to stop Aspirin, cilostazol (PLETAL), and clopidogrel (PLAVIX).  If no instructions were given by your surgeon then you will need to call the office to get those instructions.     As of today, STOP taking any Aleve, Naproxen, Ibuprofen, Motrin, Advil, Goody's, BC's, all herbal medications, fish oil, and all vitamins.                     Do NOT Smoke (Tobacco/Vaping) for 24 hours prior to your procedure.  If you use a CPAP at night, you may bring your mask/headgear for your overnight stay.   Contacts, glasses, piercing's, hearing aid's, dentures or partials may not be worn into surgery, please bring cases for these belongings.    For patients admitted to the hospital, discharge time will be determined by your treatment team.    Patients discharged the day of surgery will not be allowed to drive home, and someone needs to stay with them for 24 hours.  SURGICAL WAITING ROOM VISITATION Patients having surgery or a procedure may have 2 support people in the waiting area. Visitors may stay in the waiting area during the procedure and switch out with other visitors if needed. Only 1 support person is allowed in the pre-op area with the patient AFTER the patient is prepped. This person cannot be switched out.  Children under the age of 24 must have an adult accompany them who is not the patient. If the patient needs to stay at the hospital during part of their recovery, the visitor guidelines for inpatient rooms apply.  Please refer to the [facility] website for the visitor guidelines for Inpatients (after your surgery is over and you are in a regular room).    Oral Hygiene is also important to reduce your risk of infection.  Remember - BRUSH YOUR TEETH THE MORNING OF SURGERY WITH YOUR REGULAR TOOTHPASTE   Day of Surgery: Take a shower with CHG soap. Do not wear lotions, powders, perfumes/colognes, or deodorant. Do not wear jewelry or makeup Do not shave 48 hours prior to surgery.  Men may shave face and neck. Do not wear nail polish, gel polish, artificial nails, or any other type of covering on natural nails (fingers and toes) If you have artificial nails or gel coating that need to  be removed by a nail salon, please have this removed prior to surgery. Artificial nails or gel coating may interfere with anesthesia's ability to adequately monitor your vital signs. Wear Clean/Comfortable clothing the morning of surgery Do not bring valuables to the hospital.  [facility] is not responsible for any belongings or valuables. Remember to brush your teeth WITH YOUR REGULAR TOOTHPASTE.   Please read over the following fact sheets that you were given.  If you received a COVID test during your pre-op visit  it is requested  that you wear a mask when out in public, stay away from anyone that may not be feeling well and notify your surgeon if you develop symptoms. If you have been in contact with anyone that has tested positive in the last 10 days please notify you surgeon.

## 2022-10-14 ENCOUNTER — Encounter (HOSPITAL_COMMUNITY): Payer: Self-pay

## 2022-10-14 ENCOUNTER — Encounter (HOSPITAL_COMMUNITY)
Admission: RE | Admit: 2022-10-14 | Discharge: 2022-10-14 | Disposition: A | Discharge status: Home / Self Care | Payer: Medicare Other | Source: Ambulatory Visit | Attending: Neurological Surgery | Admitting: Neurological Surgery

## 2022-10-14 ENCOUNTER — Other Ambulatory Visit: Payer: Self-pay

## 2022-10-14 VITALS — BP 140/67 | HR 90 | Temp 98.2°F | Resp 18 | Ht 68.0 in | Wt 150.5 lb

## 2022-10-14 DIAGNOSIS — E785 Hyperlipidemia, unspecified: Secondary | ICD-10-CM | POA: Insufficient documentation

## 2022-10-14 DIAGNOSIS — I480 Paroxysmal atrial fibrillation: Secondary | ICD-10-CM | POA: Diagnosis not present

## 2022-10-14 DIAGNOSIS — F17200 Nicotine dependence, unspecified, uncomplicated: Secondary | ICD-10-CM | POA: Diagnosis not present

## 2022-10-14 DIAGNOSIS — Z01818 Encounter for other preprocedural examination: Secondary | ICD-10-CM

## 2022-10-14 DIAGNOSIS — Z8679 Personal history of other diseases of the circulatory system: Secondary | ICD-10-CM | POA: Diagnosis not present

## 2022-10-14 DIAGNOSIS — Z01812 Encounter for preprocedural laboratory examination: Secondary | ICD-10-CM | POA: Insufficient documentation

## 2022-10-14 DIAGNOSIS — I2510 Atherosclerotic heart disease of native coronary artery without angina pectoris: Secondary | ICD-10-CM | POA: Insufficient documentation

## 2022-10-14 DIAGNOSIS — Z955 Presence of coronary angioplasty implant and graft: Secondary | ICD-10-CM | POA: Diagnosis not present

## 2022-10-14 LAB — TYPE AND SCREEN
ABO/RH(D): O POS
Antibody Screen: NEGATIVE

## 2022-10-14 LAB — CBC
HCT: 37.6 % — ABNORMAL LOW (ref 39.0–52.0)
Hemoglobin: 12.9 g/dL — ABNORMAL LOW (ref 13.0–17.0)
MCH: 32.7 pg (ref 26.0–34.0)
MCHC: 34.3 g/dL (ref 30.0–36.0)
MCV: 95.2 fL (ref 80.0–100.0)
Platelets: 178 10*3/uL (ref 150–400)
RBC: 3.95 MIL/uL — ABNORMAL LOW (ref 4.22–5.81)
RDW: 13.1 % (ref 11.5–15.5)
WBC: 8 10*3/uL (ref 4.0–10.5)
nRBC: 0 % (ref 0.0–0.2)

## 2022-10-14 LAB — BASIC METABOLIC PANEL
Anion gap: 11 (ref 5–15)
BUN: 18 mg/dL (ref 8–23)
CO2: 26 mmol/L (ref 22–32)
Calcium: 9.8 mg/dL (ref 8.9–10.3)
Chloride: 98 mmol/L (ref 98–111)
Creatinine, Ser: 1.19 mg/dL (ref 0.61–1.24)
GFR, Estimated: >60 mL/min (ref >60)
Glucose, Bld: 143 mg/dL — ABNORMAL HIGH (ref 70–99)
Potassium: 3.9 mmol/L (ref 3.5–5.1)
Sodium: 135 mmol/L (ref 135–145)

## 2022-10-14 LAB — SURGICAL PCR SCREEN
MRSA, PCR: NEGATIVE
Staphylococcus aureus: NEGATIVE

## 2022-10-14 LAB — PROTIME-INR
INR: 1 (ref 0.8–1.2)
Prothrombin Time: 13.3 seconds (ref 11.4–15.2)

## 2022-10-14 NOTE — Progress Notes (Addendum)
PCP - Drusilla Kanner NP Cardiologist - Dr. Norman Herrlich Vascular: Dr. Dutch Quint Pulmonologist: Dr. Ralph Leyden  PPM/ICD - Denies  Chest x-ray - N/A EKG - 06/10/22 Stress Test - 12/18/2009 ECHO - 2018 Cardiac Cath - 02/08/20  Sleep Study - Denies  2L of O2 @ night  Diabetes: Denies  Blood Thinner Instructions: Follow surgeon's instructions. Aspirin Instructions: Follow surgeon's instructions.  ERAS Protcol - Yes PRE-SURGERY Ensure or G2- No  COVID TEST- N/A   Anesthesia review: Yes, cardiac hx  Patient denies shortness of breath, fever, cough and chest pain at PAT appointment   All instructions explained to the patient, with a verbal understanding of the material. Patient agrees to go over the instructions while at home for a better understanding. Patient also instructed to self quarantine after being tested for COVID-19. The opportunity to ask questions was provided.

## 2022-10-15 NOTE — Progress Notes (Signed)
Anesthesia Chart Review:  Follows with cardiology for history of CAD s/p staged PCI and stent to the LAD and left circumflex August 2021, paroxysmal A-fib (not on anticoagulation), HLD.  Seen by Dr. Dulce Sellar 06/10/2022 and cleared for surgery at that time.  Per note, "He has stable chronic CAD and does not have a history of bradycardia pacemaker atrial fibrillation heart failure.  My perspective is he is optimized for his planned surgical procedure he can withdraw dual antiplatelet therapy normally prior to surgery reinstitute aspirin within 24 hours and clopidogrel when safe postoperatively from the perspective however I do think there needs to be communication with his vascular surgeons after carotid surgery."  Follows with pulmonology at [facility] for history of severe COPD and nocturnal hypoxemia (uses 2 L supplemental O2 nightly and as needed during the day), likely OSA (patient did not want to undergo testing).  He is a current 65-pack-year smoker and is also noted to have significant EtOH use.  Last seen by Dr. Su Monks 09/16/2022 for preop evaluation.  Per note, "Patient is otherwise cleared from a pulmonary standpoint to undergo procedure for his back he has severe underlying lung disease duration of anesthesia should be minimized as much as possible and consider PAP therapy nightly and as needed in the postop phase."  Follows with vascular surgery at [facility] for history of PAD.  He is s/p left CEA 02/2022 for asymptomatic high-grade left carotid stenosis. e also has a prior right SFA stent. He has diffuse lower extremity PAD with lifestyle limiting right lower extremity claudication.  Was last seen by Dr. Alben Spittle 10/17/2022 for routine follow-up as well as preop evaluation.  Per note, carotid duplex at that time showed bilateral 60 to 79% stenosis.  Note states, "He is asymptomatic from his carotid disease and will continue on surveillance. We discussed that he may require treatment if his scabs do not heal or  if he develops worsening wounds on the right lower extremity. He will proceed with his back surgery and call me if he has any difficulty with these wounds not healing. We will repeat his ABI at his next visit."  Preop labs reviewed, unremarkable.  EKG 06/10/2022: Normal sinus rhythm.  Rate 87.  Nonspecific ST and T wave abnormality.  CTA abdominal aorta and bilateral iliofemoral runoff 04/18/2022 (Care Everywhere): IMPRESSION:  VASCULAR   1. Right lower extremity: Short-segment high-grade stenosis of the  distal external iliac artery secondary to atherosclerotic plaque.  Occluded right superficial femoral artery from the level of [place]'s  canal through the P1 segment of the popliteal artery including total  occlusion of the indwelling distal superficial femoral artery to  popliteal artery stent. Two vessel runoff to the level of the ankle.  2. Left lower extremity: No evidence of significant inflow stenosis.  Focal high-grade stenosis about the proximal left superficial  femoral artery. Three-vessel runoff.  3. Unchanged fusiform infrarenal abdominal aortic aneurysm measuring  up to 3.8 cm in maximum short axis dimension. Recommend follow-up  ultrasound every 2 years. This recommendation follows ACR consensus  guidelines: White Paper of the ACR Incidental Findings Committee II  on Vascular Findings. J Am Coll Radiol 2013; 09:811-914.  4.  Aortic Atherosclerosis (ICD10-I70.0).   NON-VASCULAR   1. Hepatic steatosis.  2. Nonobstructive punctate left nephrolithiasis.   Cath and PCI 02/08/2020:  Prox Cx lesion is 55% stenosed. 1st Mrg lesion is 50% stenosed. Mid Cx lesion is 70% stenosed. Dist Cx lesion is 90% stenosed. Orbital atherectomy followed by was performed  on both lesions. A drug-eluting stent was successfully placed covering both lesions, using a SYNERGY XD [id number]. Post dilated to 3.1 mm Post intervention, there is a 0% residual stenosis throughout the stented  segment. LPAV lesion is 45% stenosed. ------------------ Previously placed Prox LAD to Mid LAD drug eluting stent is widely patent. Mid LAD lesion is 60% stenosed. Mid LAD to Dist LAD lesion is 50% stenosed. ------------------ LV end diastolic pressure is mildly elevated. There is no aortic valve stenosis.   SUMMARY Successful orbital atherectomy and DES PCI of the mid and distal LCx covering both lesions with a single Synergy DES 2.75 mm x 28 mm postdilated to 2.9 mm Borderline LVEDP    Zannie Cove [facility] Short Stay Center/Anesthesiology Phone [phone] 10/17/2022 12:48 PM

## 2022-10-17 NOTE — Anesthesia Preprocedure Evaluation (Signed)
Anesthesia Evaluation  Patient identified by MRN, date of birth, ID band Patient awake    Reviewed: Allergy & Precautions, H&P , NPO status , Patient's Chart, lab work & pertinent test results  Airway Mallampati: II  TM Distance: >3 FB Neck ROM: Full    Dental no notable dental hx.    Pulmonary shortness of breath, asthma , COPD,  COPD inhaler and oxygen dependent, Current Smoker and Patient abstained from smoking.   breath sounds clear to auscultation + decreased breath sounds      Cardiovascular hypertension, + CAD, + Cardiac Stents and + Peripheral Vascular Disease  Normal cardiovascular exam Rhythm:Regular Rate:Normal     Neuro/Psych negative neurological ROS  negative psych ROS   GI/Hepatic negative GI ROS,,,(+)     substance abuse  alcohol use  Endo/Other  negative endocrine ROS    Renal/GU negative Renal ROS  negative genitourinary   Musculoskeletal negative musculoskeletal ROS (+)    Abdominal   Peds negative pediatric ROS (+)  Hematology negative hematology ROS (+)   Anesthesia Other Findings   Reproductive/Obstetrics negative OB ROS                             Anesthesia Physical Anesthesia Plan  ASA: 4  Anesthesia Plan: General   Post-op Pain Management:    Induction: Intravenous  PONV Risk Score and Plan: 1 and Ondansetron, Dexamethasone and Treatment may vary due to age or medical condition  Airway Management Planned: Oral ETT  Additional Equipment: Arterial line  Intra-op Plan:   Post-operative Plan: Possible Post-op intubation/ventilation  Informed Consent: I have reviewed the patients History and Physical, chart, labs and discussed the procedure including the risks, benefits and alternatives for the proposed anesthesia with the patient or authorized representative who has indicated his/her understanding and acceptance.     Dental advisory given  Plan  Discussed with: CRNA and Surgeon  Anesthesia Plan Comments: (PAT note by Antionette Poles, PA-C: Follows with cardiology for history of CAD s/p staged PCI and stent to the LAD and left circumflex August 2021, paroxysmal A-fib (not on anticoagulation), HLD.  Seen by Dr. Dulce Sellar 06/10/2022 and cleared for surgery at that time.  Per note, "He has stable chronic CAD and does not have a history of bradycardia pacemaker atrial fibrillation heart failure.  My perspective is he is optimized for his planned surgical procedure he can withdraw dual antiplatelet therapy normally prior to surgery reinstitute aspirin within 24 hours and clopidogrel when safe postoperatively from the perspective however I do think there needs to be communication with his vascular surgeons after carotid surgery."  Follows with pulmonology at [facility] for history of severe COPD and nocturnal hypoxemia (uses 2 L supplemental O2 nightly and as needed during the day), likely OSA (patient did not want to undergo testing).  He is a current 65-pack-year smoker and is also noted to have significant EtOH use.  Last seen by Dr. Su Monks 09/16/2022 for preop evaluation.  Per note, "Patient is otherwise cleared from a pulmonary standpoint to undergo procedure for his back he has severe underlying lung disease duration of anesthesia should be minimized as much as possible and consider PAP therapy nightly and as needed in the postop phase."  Follows with vascular surgery at [facility] for history of PAD.  He is s/p left CEA 02/2022 for asymptomatic high-grade left carotid stenosis. e also has a prior right SFA stent. He has diffuse lower extremity PAD with  lifestyle limiting right lower extremity claudication.  Was last seen by Dr. Alben Spittle 10/17/2022 for routine follow-up as well as preop evaluation.  Per note, carotid duplex at that time showed bilateral 60 to 79% stenosis.  Note states, "He is asymptomatic from his carotid disease and will continue on surveillance. We  discussed that he may require treatment if his scabs do not heal or if he develops worsening wounds on the right lower extremity. He will proceed with his back surgery and call me if he has any difficulty with these wounds not healing. We will repeat his ABI at his next visit."  Preop labs reviewed, unremarkable.  EKG 06/10/2022: Normal sinus rhythm.  Rate 87.  Nonspecific ST and T wave abnormality.  CTA abdominal aorta and bilateral iliofemoral runoff 04/18/2022 (Care Everywhere): IMPRESSION:  VASCULAR   1. Right lowerextremity: Short-segment high-grade stenosis of the  distal external iliac artery secondary to atherosclerotic plaque.  Occluded right superficial femoral artery from the level of Hunter's  canal through the P1 segment of the popliteal artery including total  occlusion of the indwelling distal superficial femoral artery to  popliteal artery stent. Two vessel runoff to the level of the ankle.  2. Left lower extremity: No evidence of significant inflow stenosis.  Focal high-grade stenosis about the proximal left superficial  femoral artery. Three-vessel runoff.  3. Unchanged fusiform infrarenal abdominal aortic aneurysm measuring  up to 3.8 cm in maximum short axis dimension. Recommend follow-up  ultrasound every 2 years. This recommendation follows ACRconsensus  guidelines: White Paper of the ACR Incidental Findings Committee II  on Vascular Findings. J Am Coll Radiol 2013; 46:962-952.  4. Aortic Atherosclerosis (ICD10-I70.0).   NON-VASCULAR   1. Hepatic steatosis.  2. Nonobstructive punctate leftnephrolithiasis.   Cath and PCI 02/08/2020: ?  Prox Cx lesion is 55% stenosed. 1st Mrg lesion is 50% stenosed. ? Mid Cx lesion is 70% stenosed. Dist Cx lesion is 90% stenosed. ? Orbital atherectomy followed by was performed on both lesions. ? A drug-eluting stent was successfully placed covering both lesions, using a SYNERGY XD [id number]. Post dilated to 3.1 mm ? Post  intervention, there is a 0% residual stenosis throughout the stented segment. ? LPAV lesion is 45% stenosed. ? ------------------ ? Previously placed Prox LAD to Mid LAD drug eluting stent is widely patent. ? Mid LAD lesion is 60% stenosed. Mid LAD to Dist LAD lesion is 50% stenosed. ? ------------------ ? LV end diastolic pressure is mildly elevated. ? There is no aortic valve stenosis.   SUMMARY ? Successful orbital atherectomy and DES PCI of the mid and distal LCx covering both lesions with a single Synergy DES 2.75 mm x 28 mm postdilated to 2.9 mm ? Borderline LVEDP  )        Anesthesia Quick Evaluation

## 2022-10-20 ENCOUNTER — Inpatient Hospital Stay (HOSPITAL_COMMUNITY): Payer: Medicare Other | Admitting: Anesthesiology

## 2022-10-20 ENCOUNTER — Observation Stay (HOSPITAL_COMMUNITY)
Admission: RE | Admit: 2022-10-20 | Discharge: 2022-10-21 | Disposition: A | Discharge status: Home / Self Care | Payer: Medicare Other | Attending: Neurological Surgery | Admitting: Neurological Surgery

## 2022-10-20 ENCOUNTER — Inpatient Hospital Stay (HOSPITAL_COMMUNITY): Payer: Medicare Other | Admitting: Physician Assistant

## 2022-10-20 ENCOUNTER — Other Ambulatory Visit: Payer: Self-pay

## 2022-10-20 ENCOUNTER — Inpatient Hospital Stay (HOSPITAL_COMMUNITY): Payer: Medicare Other

## 2022-10-20 ENCOUNTER — Ambulatory Visit (HOSPITAL_COMMUNITY)
Admission: RE | Disposition: A | Discharge status: Home / Self Care | Payer: Self-pay | Source: Home / Self Care | Attending: Neurological Surgery

## 2022-10-20 ENCOUNTER — Encounter (HOSPITAL_COMMUNITY): Payer: Self-pay | Admitting: Neurological Surgery

## 2022-10-20 DIAGNOSIS — Z79899 Other long term (current) drug therapy: Secondary | ICD-10-CM | POA: Diagnosis not present

## 2022-10-20 DIAGNOSIS — M48061 Spinal stenosis, lumbar region without neurogenic claudication: Secondary | ICD-10-CM

## 2022-10-20 DIAGNOSIS — F17210 Nicotine dependence, cigarettes, uncomplicated: Secondary | ICD-10-CM

## 2022-10-20 DIAGNOSIS — N182 Chronic kidney disease, stage 2 (mild): Secondary | ICD-10-CM | POA: Diagnosis not present

## 2022-10-20 DIAGNOSIS — M4316 Spondylolisthesis, lumbar region: Principal | ICD-10-CM | POA: Insufficient documentation

## 2022-10-20 DIAGNOSIS — J449 Chronic obstructive pulmonary disease, unspecified: Secondary | ICD-10-CM

## 2022-10-20 DIAGNOSIS — M5416 Radiculopathy, lumbar region: Secondary | ICD-10-CM | POA: Insufficient documentation

## 2022-10-20 DIAGNOSIS — Z96641 Presence of right artificial hip joint: Secondary | ICD-10-CM | POA: Diagnosis not present

## 2022-10-20 DIAGNOSIS — Z981 Arthrodesis status: Principal | ICD-10-CM

## 2022-10-20 DIAGNOSIS — Z955 Presence of coronary angioplasty implant and graft: Secondary | ICD-10-CM | POA: Diagnosis not present

## 2022-10-20 DIAGNOSIS — Z7902 Long term (current) use of antithrombotics/antiplatelets: Secondary | ICD-10-CM | POA: Diagnosis not present

## 2022-10-20 DIAGNOSIS — J45909 Unspecified asthma, uncomplicated: Secondary | ICD-10-CM | POA: Diagnosis not present

## 2022-10-20 DIAGNOSIS — I480 Paroxysmal atrial fibrillation: Secondary | ICD-10-CM | POA: Diagnosis not present

## 2022-10-20 DIAGNOSIS — Z85828 Personal history of other malignant neoplasm of skin: Secondary | ICD-10-CM | POA: Insufficient documentation

## 2022-10-20 DIAGNOSIS — I2510 Atherosclerotic heart disease of native coronary artery without angina pectoris: Secondary | ICD-10-CM | POA: Insufficient documentation

## 2022-10-20 DIAGNOSIS — I129 Hypertensive chronic kidney disease with stage 1 through stage 4 chronic kidney disease, or unspecified chronic kidney disease: Secondary | ICD-10-CM | POA: Insufficient documentation

## 2022-10-20 DIAGNOSIS — Z9889 Other specified postprocedural states: Secondary | ICD-10-CM

## 2022-10-20 HISTORY — DX: Arthrodesis status: Z98.1

## 2022-10-20 HISTORY — PX: LAMINECTOMY WITH POSTERIOR LATERAL ARTHRODESIS LEVEL 3: SHX6337

## 2022-10-20 SURGERY — LAMINECTOMY WITH POSTERIOR LATERAL ARTHRODESIS LEVEL 3
Anesthesia: General | Site: Back | Laterality: Bilateral

## 2022-10-20 MED ORDER — ALBUTEROL SULFATE (2.5 MG/3ML) 0.083% IN NEBU: 2.5000 mg | INHALATION_SOLUTION | Freq: Four times a day (QID) | RESPIRATORY_TRACT | Status: DC | PRN

## 2022-10-20 MED ORDER — DEXAMETHASONE SODIUM PHOSPHATE 4 MG/ML IJ SOLN
4.0000 mg | Freq: Four times a day (QID) | INTRAMUSCULAR | Status: DC
  Administered 2022-10-20 (×2): 4 mg via INTRAVENOUS
  Filled 2022-10-20 (×2): qty 1

## 2022-10-20 MED ORDER — OXYCODONE HCL 5 MG PO TABS: 5.0000 mg | ORAL_TABLET | Freq: Once | ORAL | Status: DC | PRN

## 2022-10-20 MED ORDER — DEXAMETHASONE SODIUM PHOSPHATE 10 MG/ML IJ SOLN
INTRAMUSCULAR | Status: AC
  Filled 2022-10-20: qty 1

## 2022-10-20 MED ORDER — MORPHINE SULFATE (PF) 2 MG/ML IV SOLN: 2.0000 mg | INTRAVENOUS | Status: DC | PRN

## 2022-10-20 MED ORDER — STERILE WATER FOR IRRIGATION IR SOLN
Status: DC | PRN
  Administered 2022-10-20: 1000 mL

## 2022-10-20 MED ORDER — CHLORHEXIDINE GLUCONATE 0.12 % MT SOLN: 15.0000 mL | Freq: Once | OROMUCOSAL | Status: AC

## 2022-10-20 MED ORDER — FENTANYL CITRATE (PF) 250 MCG/5ML IJ SOLN
INTRAMUSCULAR | Status: AC
  Filled 2022-10-20: qty 5

## 2022-10-20 MED ORDER — PHENYLEPHRINE 80 MCG/ML (10ML) SYRINGE FOR IV PUSH (FOR BLOOD PRESSURE SUPPORT)
PREFILLED_SYRINGE | INTRAVENOUS | Status: DC | PRN
  Administered 2022-10-20 (×4): 80 ug via INTRAVENOUS
  Administered 2022-10-20: 160 ug via INTRAVENOUS
  Administered 2022-10-20: 80 ug via INTRAVENOUS

## 2022-10-20 MED ORDER — 0.9 % SODIUM CHLORIDE (POUR BTL) OPTIME
TOPICAL | Status: DC | PRN
  Administered 2022-10-20: 1000 mL

## 2022-10-20 MED ORDER — ACETAMINOPHEN 500 MG PO TABS: 1000.0000 mg | ORAL_TABLET | ORAL | Status: AC

## 2022-10-20 MED ORDER — GABAPENTIN 300 MG PO CAPS: 300.0000 mg | ORAL_CAPSULE | ORAL | Status: AC

## 2022-10-20 MED ORDER — METHOCARBAMOL 1000 MG/10ML IJ SOLN
500.0000 mg | Freq: Four times a day (QID) | INTRAVENOUS | Status: DC | PRN
  Filled 2022-10-20: qty 5

## 2022-10-20 MED ORDER — PHENOL 1.4 % MT LIQD: 1.0000 | OROMUCOSAL | Status: DC | PRN

## 2022-10-20 MED ORDER — IPRATROPIUM-ALBUTEROL 0.5-2.5 (3) MG/3ML IN SOLN
RESPIRATORY_TRACT | Status: AC
  Administered 2022-10-20: 3 mL via RESPIRATORY_TRACT
  Filled 2022-10-20: qty 3

## 2022-10-20 MED ORDER — PROPOFOL 10 MG/ML IV BOLUS
INTRAVENOUS | Status: AC
  Filled 2022-10-20: qty 20

## 2022-10-20 MED ORDER — ORAL CARE MOUTH RINSE: 15.0000 mL | Freq: Once | OROMUCOSAL | Status: AC

## 2022-10-20 MED ORDER — CHLORHEXIDINE GLUCONATE CLOTH 2 % EX PADS: 6.0000 | MEDICATED_PAD | Freq: Once | CUTANEOUS | Status: DC

## 2022-10-20 MED ORDER — ACETAMINOPHEN 500 MG PO TABS
ORAL_TABLET | ORAL | Status: AC
  Administered 2022-10-20: 1000 mg via ORAL
  Filled 2022-10-20: qty 2

## 2022-10-20 MED ORDER — SUCCINYLCHOLINE CHLORIDE 200 MG/10ML IV SOSY
PREFILLED_SYRINGE | INTRAVENOUS | Status: AC
  Filled 2022-10-20: qty 10

## 2022-10-20 MED ORDER — THROMBIN 5000 UNITS EX SOLR
CUTANEOUS | Status: AC
  Filled 2022-10-20: qty 5000

## 2022-10-20 MED ORDER — ONDANSETRON HCL 4 MG PO TABS: 4.0000 mg | ORAL_TABLET | Freq: Four times a day (QID) | ORAL | Status: DC | PRN

## 2022-10-20 MED ORDER — CELECOXIB 200 MG PO CAPS
200.0000 mg | ORAL_CAPSULE | Freq: Two times a day (BID) | ORAL | Status: DC
  Administered 2022-10-20 – 2022-10-21 (×3): 200 mg via ORAL
  Filled 2022-10-20 (×3): qty 1

## 2022-10-20 MED ORDER — OXYCODONE HCL 5 MG/5ML PO SOLN: 5.0000 mg | Freq: Once | ORAL | Status: DC | PRN

## 2022-10-20 MED ORDER — DEXAMETHASONE 4 MG PO TABS
4.0000 mg | ORAL_TABLET | Freq: Four times a day (QID) | ORAL | Status: DC
  Administered 2022-10-20 – 2022-10-21 (×2): 4 mg via ORAL
  Filled 2022-10-20 (×2): qty 1

## 2022-10-20 MED ORDER — PHENYLEPHRINE 80 MCG/ML (10ML) SYRINGE FOR IV PUSH (FOR BLOOD PRESSURE SUPPORT)
PREFILLED_SYRINGE | INTRAVENOUS | Status: AC
  Filled 2022-10-20: qty 10

## 2022-10-20 MED ORDER — CEFAZOLIN SODIUM-DEXTROSE 2-4 GM/100ML-% IV SOLN
2.0000 g | Freq: Three times a day (TID) | INTRAVENOUS | Status: AC
  Administered 2022-10-20 (×2): 2 g via INTRAVENOUS
  Filled 2022-10-20 (×2): qty 100

## 2022-10-20 MED ORDER — FLUTICASONE FUROATE-VILANTEROL 200-25 MCG/ACT IN AEPB
1.0000 | INHALATION_SPRAY | Freq: Every day | RESPIRATORY_TRACT | Status: DC
  Administered 2022-10-20 – 2022-10-21 (×2): 1 via RESPIRATORY_TRACT
  Filled 2022-10-20: qty 28

## 2022-10-20 MED ORDER — SENNA 8.6 MG PO TABS
1.0000 | ORAL_TABLET | Freq: Two times a day (BID) | ORAL | Status: DC
  Administered 2022-10-20 – 2022-10-21 (×3): 8.6 mg via ORAL
  Filled 2022-10-20 (×3): qty 1

## 2022-10-20 MED ORDER — PHENYLEPHRINE HCL-NACL 20-0.9 MG/250ML-% IV SOLN
INTRAVENOUS | Status: DC | PRN
  Administered 2022-10-20: 30 ug/min via INTRAVENOUS

## 2022-10-20 MED ORDER — UMECLIDINIUM BROMIDE 62.5 MCG/ACT IN AEPB
1.0000 | INHALATION_SPRAY | Freq: Every day | RESPIRATORY_TRACT | Status: DC
  Administered 2022-10-20 – 2022-10-21 (×2): 1 via RESPIRATORY_TRACT
  Filled 2022-10-20: qty 7

## 2022-10-20 MED ORDER — PROPOFOL 10 MG/ML IV BOLUS
INTRAVENOUS | Status: DC | PRN
  Administered 2022-10-20: 100 mg via INTRAVENOUS

## 2022-10-20 MED ORDER — GABAPENTIN 300 MG PO CAPS
ORAL_CAPSULE | ORAL | Status: AC
  Administered 2022-10-20: 300 mg via ORAL
  Filled 2022-10-20: qty 1

## 2022-10-20 MED ORDER — GELATIN ABSORBABLE MT POWD
OROMUCOSAL | Status: DC | PRN
  Administered 2022-10-20: 1 via TOPICAL

## 2022-10-20 MED ORDER — HYDROMORPHONE HCL 1 MG/ML IJ SOLN: 0.2500 mg | INTRAMUSCULAR | Status: DC | PRN

## 2022-10-20 MED ORDER — CEFAZOLIN SODIUM-DEXTROSE 2-4 GM/100ML-% IV SOLN
2.0000 g | INTRAVENOUS | Status: AC
  Administered 2022-10-20: 2 g via INTRAVENOUS

## 2022-10-20 MED ORDER — ALBUMIN HUMAN 5 % IV SOLN
INTRAVENOUS | Status: AC
  Filled 2022-10-20: qty 250

## 2022-10-20 MED ORDER — ONDANSETRON HCL 4 MG/2ML IJ SOLN: 4.0000 mg | Freq: Four times a day (QID) | INTRAMUSCULAR | Status: DC | PRN

## 2022-10-20 MED ORDER — ROCURONIUM BROMIDE 10 MG/ML (PF) SYRINGE
PREFILLED_SYRINGE | INTRAVENOUS | Status: DC | PRN
  Administered 2022-10-20: 50 mg via INTRAVENOUS

## 2022-10-20 MED ORDER — SURGIPHOR WOUND IRRIGATION SYSTEM - OPTIME
TOPICAL | Status: DC | PRN
  Administered 2022-10-20: 450 mL

## 2022-10-20 MED ORDER — LACTATED RINGERS IV SOLN: INTRAVENOUS | Status: DC

## 2022-10-20 MED ORDER — BUDESON-GLYCOPYRROL-FORMOTEROL 160-9-4.8 MCG/ACT IN AERO: 2.0000 | INHALATION_SPRAY | Freq: Every day | RESPIRATORY_TRACT | Status: DC

## 2022-10-20 MED ORDER — ONDANSETRON HCL 4 MG/2ML IJ SOLN
INTRAMUSCULAR | Status: DC | PRN
  Administered 2022-10-20: 4 mg via INTRAVENOUS

## 2022-10-20 MED ORDER — ALBUMIN HUMAN 5 % IV SOLN
12.5000 g | Freq: Once | INTRAVENOUS | Status: AC
  Administered 2022-10-20: 12.5 g via INTRAVENOUS

## 2022-10-20 MED ORDER — VANCOMYCIN HCL 1000 MG IV SOLR
INTRAVENOUS | Status: AC
  Filled 2022-10-20: qty 20

## 2022-10-20 MED ORDER — OXYCODONE HCL 5 MG PO TABS
5.0000 mg | ORAL_TABLET | ORAL | Status: DC | PRN
  Administered 2022-10-20 (×2): 5 mg via ORAL
  Filled 2022-10-20 (×2): qty 1

## 2022-10-20 MED ORDER — THROMBIN (RECOMBINANT) 20000 UNITS EX SOLR
CUTANEOUS | Status: AC
  Filled 2022-10-20: qty 20000

## 2022-10-20 MED ORDER — ASPIRIN 81 MG PO TBEC
81.0000 mg | DELAYED_RELEASE_TABLET | Freq: Every day | ORAL | Status: DC
  Administered 2022-10-20 – 2022-10-21 (×2): 81 mg via ORAL
  Filled 2022-10-20 (×2): qty 1

## 2022-10-20 MED ORDER — SODIUM CHLORIDE 0.9% FLUSH
3.0000 mL | Freq: Two times a day (BID) | INTRAVENOUS | Status: DC
  Administered 2022-10-20: 3 mL via INTRAVENOUS

## 2022-10-20 MED ORDER — SUGAMMADEX SODIUM 200 MG/2ML IV SOLN
INTRAVENOUS | Status: DC | PRN
  Administered 2022-10-20: 200 mg via INTRAVENOUS

## 2022-10-20 MED ORDER — FENTANYL CITRATE (PF) 250 MCG/5ML IJ SOLN
INTRAMUSCULAR | Status: DC | PRN
  Administered 2022-10-20 (×5): 50 ug via INTRAVENOUS

## 2022-10-20 MED ORDER — BUPIVACAINE HCL (PF) 0.25 % IJ SOLN
INTRAMUSCULAR | Status: DC | PRN
  Administered 2022-10-20: 7 mL

## 2022-10-20 MED ORDER — CEFAZOLIN SODIUM-DEXTROSE 2-4 GM/100ML-% IV SOLN
INTRAVENOUS | Status: AC
  Filled 2022-10-20: qty 100

## 2022-10-20 MED ORDER — THROMBIN 5000 UNITS EX SOLR
CUTANEOUS | Status: DC | PRN
  Administered 2022-10-20: 5000 [IU] via TOPICAL

## 2022-10-20 MED ORDER — ALBUTEROL SULFATE HFA 108 (90 BASE) MCG/ACT IN AERS
INHALATION_SPRAY | RESPIRATORY_TRACT | Status: DC | PRN
  Administered 2022-10-20: 4 via RESPIRATORY_TRACT

## 2022-10-20 MED ORDER — EPHEDRINE SULFATE-NACL 50-0.9 MG/10ML-% IV SOSY
PREFILLED_SYRINGE | INTRAVENOUS | Status: DC | PRN
  Administered 2022-10-20 (×2): 5 mg via INTRAVENOUS

## 2022-10-20 MED ORDER — SODIUM CHLORIDE 0.9% FLUSH: 3.0000 mL | INTRAVENOUS | Status: DC | PRN

## 2022-10-20 MED ORDER — IPRATROPIUM-ALBUTEROL 0.5-2.5 (3) MG/3ML IN SOLN: 3.0000 mL | RESPIRATORY_TRACT | Status: DC

## 2022-10-20 MED ORDER — DEXAMETHASONE SODIUM PHOSPHATE 10 MG/ML IJ SOLN
INTRAMUSCULAR | Status: DC | PRN
  Administered 2022-10-20: 10 mg via INTRAVENOUS

## 2022-10-20 MED ORDER — POTASSIUM CHLORIDE IN NACL 20-0.9 MEQ/L-% IV SOLN: INTRAVENOUS | Status: DC

## 2022-10-20 MED ORDER — METHOCARBAMOL 500 MG PO TABS
500.0000 mg | ORAL_TABLET | Freq: Four times a day (QID) | ORAL | Status: DC | PRN
  Administered 2022-10-20 – 2022-10-21 (×3): 500 mg via ORAL
  Filled 2022-10-20 (×3): qty 1

## 2022-10-20 MED ORDER — LIDOCAINE 2% (20 MG/ML) 5 ML SYRINGE
INTRAMUSCULAR | Status: DC | PRN
  Administered 2022-10-20: 100 mg via INTRAVENOUS

## 2022-10-20 MED ORDER — ROCURONIUM BROMIDE 10 MG/ML (PF) SYRINGE
PREFILLED_SYRINGE | INTRAVENOUS | Status: AC
  Filled 2022-10-20: qty 10

## 2022-10-20 MED ORDER — ACETAMINOPHEN 500 MG PO TABS
1000.0000 mg | ORAL_TABLET | Freq: Four times a day (QID) | ORAL | Status: AC
  Administered 2022-10-20 – 2022-10-21 (×4): 1000 mg via ORAL
  Filled 2022-10-20 (×4): qty 2

## 2022-10-20 MED ORDER — MENTHOL 3 MG MT LOZG: 1.0000 | LOZENGE | OROMUCOSAL | Status: DC | PRN

## 2022-10-20 MED ORDER — UMECLIDINIUM-VILANTEROL 62.5-25 MCG/ACT IN AEPB: 1.0000 | INHALATION_SPRAY | Freq: Every day | RESPIRATORY_TRACT | Status: DC

## 2022-10-20 MED ORDER — CHLORHEXIDINE GLUCONATE 0.12 % MT SOLN
OROMUCOSAL | Status: AC
  Administered 2022-10-20: 15 mL via OROMUCOSAL
  Filled 2022-10-20: qty 15

## 2022-10-20 MED ORDER — THROMBIN 20000 UNITS EX SOLR
CUTANEOUS | Status: DC | PRN
  Administered 2022-10-20: 20000 [IU] via TOPICAL

## 2022-10-20 MED ORDER — ALBUTEROL SULFATE HFA 108 (90 BASE) MCG/ACT IN AERS
INHALATION_SPRAY | RESPIRATORY_TRACT | Status: AC
  Filled 2022-10-20: qty 6.7

## 2022-10-20 MED ORDER — OXYCODONE HCL 5 MG PO TABS
5.0000 mg | ORAL_TABLET | ORAL | Status: DC | PRN
  Administered 2022-10-20 – 2022-10-21 (×3): 10 mg via ORAL
  Filled 2022-10-20 (×3): qty 2

## 2022-10-20 MED ORDER — GELATIN ABSORBABLE 100 EX MISC
CUTANEOUS | Status: DC | PRN
  Administered 2022-10-20: 1

## 2022-10-20 MED ORDER — ONDANSETRON HCL 4 MG/2ML IJ SOLN: 4.0000 mg | Freq: Once | INTRAMUSCULAR | Status: DC | PRN

## 2022-10-20 MED ORDER — LIDOCAINE 2% (20 MG/ML) 5 ML SYRINGE
INTRAMUSCULAR | Status: AC
  Filled 2022-10-20: qty 5

## 2022-10-20 MED ORDER — BUPIVACAINE HCL (PF) 0.25 % IJ SOLN
INTRAMUSCULAR | Status: AC
  Filled 2022-10-20: qty 30

## 2022-10-20 MED ORDER — ONDANSETRON HCL 4 MG/2ML IJ SOLN
INTRAMUSCULAR | Status: AC
  Filled 2022-10-20: qty 2

## 2022-10-20 MED ORDER — VANCOMYCIN HCL 1000 MG IV SOLR
INTRAVENOUS | Status: DC | PRN
  Administered 2022-10-20: 1000 mg via TOPICAL

## 2022-10-20 MED ORDER — SUCCINYLCHOLINE CHLORIDE 200 MG/10ML IV SOSY
PREFILLED_SYRINGE | INTRAVENOUS | Status: DC | PRN
  Administered 2022-10-20: 120 mg via INTRAVENOUS

## 2022-10-20 MED ORDER — SODIUM CHLORIDE 0.9 % IV SOLN
250.0000 mL | INTRAVENOUS | Status: DC
  Administered 2022-10-20: 250 mL via INTRAVENOUS

## 2022-10-20 SURGICAL SUPPLY — 61 items
ADH SKN CLS APL DERMABOND .7 (GAUZE/BANDAGES/DRESSINGS) ×1
APL SKNCLS STERI-STRIP NONHPOA (GAUZE/BANDAGES/DRESSINGS) ×1
BAG COUNTER SPONGE SURGICOUNT (BAG) ×1 IMPLANT
BAG SPNG CNTER NS LX DISP (BAG) ×1
BASKET BONE COLLECTION (BASKET) IMPLANT
BENZOIN TINCTURE PRP APPL 2/3 (GAUZE/BANDAGES/DRESSINGS) ×1 IMPLANT
BLADE BONE MILL MEDIUM (MISCELLANEOUS) IMPLANT
BLADE CLIPPER SURG (BLADE) IMPLANT
BUR CARBIDE MATCH 3.0 (BURR) ×1 IMPLANT
CANISTER SUCT 3000ML PPV (MISCELLANEOUS) ×1 IMPLANT
CNTNR URN SCR LID CUP LEK RST (MISCELLANEOUS) ×1 IMPLANT
CONT SPEC 4OZ STRL OR WHT (MISCELLANEOUS) ×1
COVER BACK TABLE 60X90IN (DRAPES) ×1 IMPLANT
DERMABOND ADVANCED .7 DNX12 (GAUZE/BANDAGES/DRESSINGS) IMPLANT
DRAPE C-ARM 42X72 X-RAY (DRAPES) IMPLANT
DRAPE LAPAROTOMY 100X72X124 (DRAPES) ×1 IMPLANT
DRAPE SURG 17X23 STRL (DRAPES) ×1 IMPLANT
DRSG OPSITE POSTOP 4X6 (GAUZE/BANDAGES/DRESSINGS) IMPLANT
DURAPREP 26ML APPLICATOR (WOUND CARE) ×1 IMPLANT
ELECT REM PT RETURN 9FT ADLT (ELECTROSURGICAL) ×1
ELECTRODE REM PT RTRN 9FT ADLT (ELECTROSURGICAL) ×1 IMPLANT
EVACUATOR 1/8 PVC DRAIN (DRAIN) IMPLANT
FIBER BONE ALLOSYNC EXPAND 5 (Bone Implant) IMPLANT
GAUZE 4X4 16PLY ~~LOC~~+RFID DBL (SPONGE) IMPLANT
GLOVE BIO SURGEON STRL SZ7 (GLOVE) IMPLANT
GLOVE BIO SURGEON STRL SZ8 (GLOVE) ×2 IMPLANT
GLOVE BIOGEL PI IND STRL 7.0 (GLOVE) IMPLANT
GOWN STRL REUS W/ TWL LRG LVL3 (GOWN DISPOSABLE) IMPLANT
GOWN STRL REUS W/ TWL XL LVL3 (GOWN DISPOSABLE) ×2 IMPLANT
GOWN STRL REUS W/TWL 2XL LVL3 (GOWN DISPOSABLE) IMPLANT
GOWN STRL REUS W/TWL LRG LVL3 (GOWN DISPOSABLE)
GOWN STRL REUS W/TWL XL LVL3 (GOWN DISPOSABLE) ×2
HEMOSTAT POWDER KIT SURGIFOAM (HEMOSTASIS) IMPLANT
KIT BASIN OR (CUSTOM PROCEDURE TRAY) ×1 IMPLANT
KIT TURNOVER KIT B (KITS) ×1 IMPLANT
MILL BONE PREP (MISCELLANEOUS) IMPLANT
NDL HYPO 25X1 1.5 SAFETY (NEEDLE) ×1 IMPLANT
NEEDLE HYPO 25X1 1.5 SAFETY (NEEDLE) ×1 IMPLANT
NS IRRIG 1000ML POUR BTL (IV SOLUTION) ×1 IMPLANT
PACK LAMINECTOMY NEURO (CUSTOM PROCEDURE TRAY) ×1 IMPLANT
PAD ARMBOARD 7.5X6 YLW CONV (MISCELLANEOUS) ×3 IMPLANT
PUTTY DBM 10CC (Putty) IMPLANT
ROD LORD LIPPED TI 5.5X75 (Rod) IMPLANT
SCREW CORT SHANK MOD 6.5X40 (Screw) IMPLANT
SCREW POLYAXIAL TULIP (Screw) IMPLANT
SET SCREW (Screw) ×6 IMPLANT
SET SCREW SPNE (Screw) IMPLANT
SOL ELECTROSURG ANTI STICK (MISCELLANEOUS) ×1
SOLUTION ELECTROSURG ANTI STCK (MISCELLANEOUS) ×1 IMPLANT
SOLUTION IRRIG SURGIPHOR (IV SOLUTION) IMPLANT
SPONGE SURGIFOAM ABS GEL 100 (HEMOSTASIS) ×1 IMPLANT
SPONGE T-LAP 4X18 ~~LOC~~+RFID (SPONGE) IMPLANT
STRIP CLOSURE SKIN 1/2X4 (GAUZE/BANDAGES/DRESSINGS) ×2 IMPLANT
SUT VIC AB 0 CT1 18XCR BRD8 (SUTURE) ×1 IMPLANT
SUT VIC AB 0 CT1 8-18 (SUTURE) ×1
SUT VIC AB 2-0 CP2 18 (SUTURE) ×1 IMPLANT
SUT VIC AB 3-0 SH 8-18 (SUTURE) ×2 IMPLANT
TOWEL GREEN STERILE (TOWEL DISPOSABLE) ×1 IMPLANT
TOWEL GREEN STERILE FF (TOWEL DISPOSABLE) ×1 IMPLANT
TRAY FOLEY MTR SLVR 16FR STAT (SET/KITS/TRAYS/PACK) IMPLANT
WATER STERILE IRR 1000ML POUR (IV SOLUTION) ×1 IMPLANT

## 2022-10-20 NOTE — Transfer of Care (Signed)
Immediate Anesthesia Transfer of Care Note  Patient: Corey Tate  Procedure(s) Performed: Laminectomy and Foraminotomy - Lumbar two-Lumbar three - bilateral - Lumbar three-Lumbar four - right - Lumbar four-Lumbar five - right, posterolateral fusion Lumbar three-five with pedicle screw fixation Lumbar three-five (Bilateral: Back)  Patient Location: PACU  Anesthesia Type:General  Level of Consciousness: awake and alert   Airway & Oxygen Therapy: Patient Spontanous Breathing and Patient connected to face mask oxygen  Post-op Assessment: Report given to RN, Post -op Vital signs reviewed and stable, and Patient moving all extremities X 4  Post vital signs: Reviewed and stable  Last Vitals:  Vitals Value Taken Time  BP 98/78 10/20/22 1130  Temp    Pulse 76 10/20/22 1132  Resp 24 10/20/22 1132  SpO2 98 % 10/20/22 1132  Vitals shown include unvalidated device data.  Last Pain:  Vitals:   10/20/22 0631  TempSrc:   PainSc: 0-No pain         Complications: No notable events documented.

## 2022-10-20 NOTE — Anesthesia Procedure Notes (Signed)
Procedure Name: Intubation Date/Time: 10/20/2022 7:48 AM  Performed by: Nils Pyle, CRNAPre-anesthesia Checklist: Patient identified, Emergency Drugs available, Suction available and Patient being monitored Patient Re-evaluated:Patient Re-evaluated prior to induction Oxygen Delivery Method: Circle System Utilized Preoxygenation: Pre-oxygenation with 100% oxygen Induction Type: IV induction and Rapid sequence Ventilation: Mask ventilation without difficulty Laryngoscope Size: Miller and 2 Grade View: Grade I Tube type: Oral Tube size: 7.5 mm Number of attempts: 1 Airway Equipment and Method: Stylet and Oral airway Placement Confirmation: ETT inserted through vocal cords under direct vision, positive ETCO2 and breath sounds checked- equal and bilateral Secured at: 23 cm Tube secured with: Tape Dental Injury: Teeth and Oropharynx as per pre-operative assessment

## 2022-10-20 NOTE — Op Note (Signed)
10/20/2022  11:32 AM  PATIENT:  Corey Tate  82 y.o. 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 Decompressive lumbar laminectomy, hemi facetectomy and foraminotomies L3-4 and L4-5 right  2.  Right L2-3 hemilaminectomy, medial facetectomy and foraminotomies followed by sublaminar decompression 3. Posterior fixation L3-L5 inclusive using ATEC cortical pedicle screws.  4. Intertransverse arthrodesis L2-L5 using morcellized autograft and allograft.  SURGEON:  Marikay Alar, MD  ASSISTANTS: Verlin Dike FNP  ANESTHESIA:  General  EBL: 300 ml  Total I/O In: 1600 [I.V.:1500; IV Piggyback:100] Out: 600 [Urine:300; Blood:300]  BLOOD ADMINISTERED:none  DRAINS: none   INDICATION FOR PROCEDURE: This patient presented with severe right leg pain. Imaging revealed moderate spinal stenosis at L2-3 with severe spinal stenosis at L3-4 and L4-5 where he had a previous laminectomy on the left and had postoperative spondylolisthesis at these levels. The patient tried a reasonable attempt at conservative medical measures without relief. I recommended decompression and instrumented fusion to address the stenosis as well as the segmental  instability.  Patient understood the risks, benefits, and alternatives and potential outcomes and wished to proceed.  PROCEDURE DETAILS:  The patient was brought to the operating room. After induction of generalized endotracheal anesthesia the patient was rolled into the prone position on chest rolls and all pressure points were padded. The patient's lumbar region was cleaned and then prepped with DuraPrep and draped in the usual sterile fashion. Anesthesia was injected and then a dorsal midline incision was made and carried down to the lumbosacral fascia. The fascia was opened and  the paraspinous musculature was taken down in a subperiosteal fashion to expose L2-3 L3-4 and L4-5 bilaterally. A self-retaining retractor was placed. Intraoperative fluoroscopy confirmed my level, and I started with placement of the L3-L4 and L5 cortical pedicle screws. The pedicle screw entry zones were identified utilizing surface landmarks and  AP and lateral fluoroscopy. I scored the cortex with the high-speed drill and then used the hand drill to drill an upward and outward direction into the pedicle. I then tapped line to line. I then placed a 6.5 x 40 mm cortical pedicle screw into the pedicles of L3, L4, and L5 bilaterally.    I then turned my attention to the decompression and right lumbar laminectomies, hemi- facetectomies, and foraminotomies were performed at L3-4 and L4-5.  At L2-3 I performed a right hemilaminectomy, medial facetectomy and foraminotomies and drilled up under the spinous process and performed a sublaminar decompression at this level.  My nurse practitioner was directly involved in the decompression and exposure of the neural elements. Much more generous decompression and generous foraminotomy was undertaken in order to adequately decompress the neural elements and address the patient's right leg pain. The yellow ligament was removed to expose the underlying dura and nerve roots, and generous foraminotomies were performed to adequately decompress the neural elements. Both the exiting and traversing nerve roots were decompressed until a coronary dilator passed easily along the nerve roots.  I marched out over the L5 nerve root specifically to help decompress the 5 root going into the L5-S1 foramen.      We then decorticated the transverse processes of L2, L3, L4, and L5 bilaterally and laid a mixture of morcellized autograft and allograft out over these to perform intertransverse arthrodesis at L2-L5. We then placed lordotic rods into the multiaxial  screw heads of the pedicle screws  and locked these in position with the locking caps and anti-torque device. We then checked our construct with AP and lateral fluoroscopy. Irrigated with copious amounts of surgiphor 0.5% iodine solution followed by saline solution. Inspected the nerve roots once again to assure adequate decompression, lined to the dura with Gelfoam,  and then we closed the muscle and the fascia with 0 Vicryl. Closed the subcutaneous tissues with 2-0 Vicryl and subcuticular tissues with 3-0 Vicryl. The skin was closed with benzoin and Steri-Strips. Dressing was then applied, the patient was awakened from general anesthesia and transported to the recovery room in stable condition. At the end of the procedure all sponge, needle and instrument counts were correct.   PLAN OF CARE: admit to inpatient  PATIENT DISPOSITION:  PACU - hemodynamically stable.   Delay start of Pharmacological VTE agent (>24hrs) due to surgical blood loss or risk of bleeding:  yes

## 2022-10-20 NOTE — Anesthesia Postprocedure Evaluation (Signed)
Anesthesia Post Note  Patient: Corey Tate  Procedure(s) Performed: Laminectomy and Foraminotomy - Lumbar two-Lumbar three - bilateral - Lumbar three-Lumbar four - right - Lumbar four-Lumbar five - right, posterolateral fusion Lumbar three-five with pedicle screw fixation Lumbar three-five (Bilateral: Back)     Patient location during evaluation: PACU Anesthesia Type: General Level of consciousness: awake and alert Pain management: pain level controlled Vital Signs Assessment: post-procedure vital signs reviewed and stable Respiratory status: spontaneous breathing, nonlabored ventilation, respiratory function stable and patient connected to nasal cannula oxygen Cardiovascular status: blood pressure returned to baseline and stable Postop Assessment: no apparent nausea or vomiting Anesthetic complications: no  No notable events documented.  Last Vitals:  Vitals:   10/20/22 1300 10/20/22 1334  BP: 113/61 105/67  Pulse: 70 63  Resp: 20 17  Temp: (!) 36.4 C (!) 36.3 C  SpO2: 92% 94%    Last Pain:  Vitals:   10/20/22 1410  TempSrc:   PainSc: 8                  Donique Hammonds S

## 2022-10-20 NOTE — H&P (Signed)
Subjective: Patient is a 82 y.o. male admitted for stenosis with radiculopathy. Onset of symptoms was several months ago, gradually worsening since that time.  The pain is rated severe, and is located at the across the lower back and radiates to RLE. The pain is described as aching and occurs all day. The symptoms have been progressive. Symptoms are exacerbated by exercise, standing, and walking for more than a few minutes. MRI or CT showed recurrent stenosis with instability L2-5   Past Medical History:  Diagnosis Date   Anxiety 12/11/2017   Arthritis    Asthma    ?   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 01/2020- DFR-guided CSI orbital atherectomy with DES PCI of proximal LAD with staged orbital atherectomy and DES PCI of the mid and distal LCx.   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   Nicotine dependence, uncomplicated 09/26/2015   Nocturnal hypoxemia 03/04/2016   PAF (paroxysmal atrial fibrillation) (HCC) 12/10/2016   Peripheral vascular disease (HCC)    Pneumonia 2021   Shortness of breath    USES OXYGEN AT NIGHT--HX OF RIGHT LOWER LOBE PULMONARY NODULE--FOLLOWED BY PT'S MEDICAL DOCTOR AND HAS HAD FOR YEARS   Smoking greater than 40 pack years 03/04/2016   Syncope 08/04/2016    Past Surgical History:   Procedure Laterality Date   ATHERECTOMY  01/31/2020   Successful DFR guided, CSI Orbital Atherectomy-DES PCI of proximal LA   BACK SURGERY     LOWER BACK SURGERY X 3 - FUSION   CARPAL TUNNEL RELEASE AND SURGERY LEFT ELBOW     CATARACT EXTRACTION     CERVICAL DISC SURGERY     FUSION - ONLY SLIGHT LIMITATION IN NECK MOVEMENT   CORONARY ATHERECTOMY N/A 01/31/2020   Procedure: CORONARY ATHERECTOMY;  Surgeon: Marykay Lex, MD;  Location: [facility] INVASIVE CV LAB;  Service: Cardiovascular;  Laterality: N/A;   CORONARY ATHERECTOMY N/A 02/08/2020   Procedure: CORONARY ATHERECTOMY;  Surgeon: Marykay Lex, MD;  Location: [facility] INVASIVE CV LAB;  Service: Cardiovascular;  Laterality: N/A;   CORONARY PRESSURE/FFR STUDY N/A 01/31/2020   Procedure: INTRAVASCULAR PRESSURE WIRE/FFR STUDY;  Surgeon: Marykay Lex, MD;  Location: [facility] INVASIVE CV LAB;  Service: Cardiovascular;  Laterality: N/A;   CORONARY STENT INTERVENTION N/A 01/31/2020   Procedure: CORONARY STENT INTERVENTION;  Surgeon: Marykay Lex, MD;  Location: [facility] INVASIVE CV LAB;  Service: Cardiovascular;  Laterality: N/A;   LEFT HEART CATH N/A 02/08/2020   Procedure: Left Heart Cath;  Surgeon: Marykay Lex, MD;  Location: [facility] INVASIVE CV LAB;  Service: Cardiovascular;  Laterality: N/A;   LEFT HEART CATH AND CORONARY ANGIOGRAPHY N/A 01/31/2020   Procedure: LEFT HEART CATH AND CORONARY ANGIOGRAPHY;  Surgeon: Marykay Lex, MD;  Location: [facility] INVASIVE CV LAB;  Service: Cardiovascular;  Laterality: N/A;   POSTERIOR  CERVICAL FUSION/FORAMINOTOMY Bilateral 09/30/2012   Procedure: CERVICAL SEVEN AND THORACIC ONE BILATERAL POSTERIOR CERVICAL FUSION/FORAMINOTOMY ;  Surgeon: Tia Alert, MD;  Location: [facility] NEURO ORS;  Service: Neurosurgery;  Laterality: Bilateral;   RIGHT SHOULDER SURGERY     SINUS SURGERY WITH INSTATRAK     TOTAL HIP ARTHROPLASTY  04/02/2012   Procedure: TOTAL HIP ARTHROPLASTY ANTERIOR APPROACH;  Surgeon: Kathryne Hitch, MD;   Location: [facility] ORS;  Service: Orthopedics;  Laterality: Right;  Right Total Hip Arthroplasty   VASCULAR SURGERY     STENT PLACEMENT RIGHT LEG AND "ROTOR ROOTER" LEFT LEG    Prior to Admission medications   Medication Sig Start Date End Date Taking? Authorizing Provider  BREZTRI AEROSPHERE 160-9-4.8 MCG/ACT AERO Inhale 2 puffs into the lungs daily. 09/29/22  Yes [provider]  docusate sodium (COLACE) 100 MG capsule Take 100 mg by mouth daily.   Yes [provider]  OXYGEN Inhale 2 L into the lungs as needed.   Yes [provider]  pravastatin (PRAVACHOL) 40 MG tablet Take 1 tablet (40 mg total) by mouth daily. 03/07/21  Yes Baldo Daub, MD  albuterol (PROVENTIL HFA;VENTOLIN HFA) 108 (90 Base) MCG/ACT inhaler Inhale 2 puffs into the lungs every 6 (six) hours as needed for wheezing or shortness of breath.     [provider]  amLODipine (NORVASC) 5 MG tablet Take 1 tablet (5 mg total) by mouth daily. Patient not taking: Reported on 10/14/2022 03/07/21 06/10/22  Baldo Daub, MD  ANORO ELLIPTA 62.5-25 MCG/INH AEPB Inhale 1 puff into the lungs daily.  11/24/17   [provider]  aspirin EC 81 MG tablet Take 81 mg by mouth daily.     [provider]  clopidogrel (PLAVIX) 75 MG tablet Take 1 tablet (75 mg total) by mouth daily. Patient not taking: Reported on 10/14/2022 03/07/21   Baldo Daub, MD  ezetimibe (ZETIA) 10 MG tablet Take 1 tablet (10 mg total) by mouth daily. Patient not taking: Reported on 10/14/2022 03/07/21 06/10/22  Baldo Daub, MD  nitroGLYCERIN (NITROSTAT) 0.4 MG SL tablet Place 1 tablet (0.4 mg total) under the tongue every 5 (five) minutes as needed for chest pain (up to 3 doses. If taking 3rd dose call 911). Patient not taking: Reported on 10/14/2022 02/23/20 06/10/22  Baldo Daub, MD   Allergies  Allergen Reactions   Ferra-Caps [Iron] Hives   Streptomycin Hives and Itching    Social History   Tobacco Use    Smoking status: Some Days    Packs/day: 1.00    Years: 55.00    Additional pack years: 0.00    Total pack years: 55.00    Types: Cigarettes   Smokeless tobacco: Never  Substance Use Topics   Alcohol use: Yes    Alcohol/week: 21.0 standard drinks of alcohol    Types: 21 Cans of beer per week    Comment: 3 BEERS A DAY    Family History  Problem Relation Age of Onset   Cancer Father    Diabetes Sister    Diabetes Brother      Review of Systems  Positive ROS: neg  All other systems have been reviewed and were otherwise negative with the exception of those mentioned in the HPI and as above.  Objective: Vital signs in last 24 hours: Temp:  [98.7 F (37.1 C)] 98.7 F (37.1 C) (04/29 0605) Pulse Rate:  [72] 72 (04/29 0605) Resp:  [17] 17 (04/29 0605) BP: (  109)/(67) 109/67 (04/29 0605) SpO2:  [94 %] 94 % (04/29 0605) Weight:  [68.5 kg] 68.5 kg (04/29 0605)  General Appearance: Alert, cooperative, no distress, appears stated age Head: Normocephalic, without obvious abnormality, atraumatic Eyes: PERRL, conjunctiva/corneas clear, EOM's intact    Neck: Supple, symmetrical, trachea midline Back: Symmetric, no curvature, ROM normal, no CVA tenderness Lungs:  respirations unlabored Heart: Regular rate and rhythm Abdomen: Soft, non-tender Extremities: Extremities normal, atraumatic, no cyanosis or edema Pulses: 2+ and symmetric all extremities Skin: Skin color, texture, turgor normal, no rashes or lesions  NEUROLOGIC:   Mental status: Alert and oriented x4,  no aphasia, good attention span, fund of knowledge, and memory Motor Exam - grossly normal Sensory Exam - grossly normal Reflexes: 1+ Coordination - grossly normal Gait - grossly normal Balance - grossly normal Cranial Nerves: I: smell Not tested  II: visual acuity  OS: nl    OD: nl  II: visual fields Full to confrontation  II: pupils Equal, round, reactive to light  III,VII: ptosis None  III,IV,VI: extraocular  muscles  Full ROM  V: mastication Normal  V: facial light touch sensation  Normal  V,VII: corneal reflex  Present  VII: facial muscle function - upper  Normal  VII: facial muscle function - lower Normal  VIII: hearing Not tested  IX: soft palate elevation  Normal  IX,X: gag reflex Present  XI: trapezius strength  5/5  XI: sternocleidomastoid strength 5/5  XI: neck flexion strength  5/5  XII: tongue strength  Normal    Data Review Lab Results  Component Value Date   WBC 8.0 10/14/2022   HGB 12.9 (L) 10/14/2022   HCT 37.6 (L) 10/14/2022   MCV 95.2 10/14/2022   PLT 178 10/14/2022   Lab Results  Component Value Date   NA 135 10/14/2022   K 3.9 10/14/2022   CL 98 10/14/2022   CO2 26 10/14/2022   BUN 18 10/14/2022   CREATININE 1.19 10/14/2022   GLUCOSE 143 (H) 10/14/2022   Lab Results  Component Value Date   INR 1.0 10/14/2022    Assessment/Plan:  Estimated body mass index is 22.96 kg/m as calculated from the following:   Height as of this encounter: 5\' 8"  (1.727 m).   Weight as of this encounter: 68.5 kg. Patient admitted for decompression and  fusion L2-5. Patient has failed a reasonable attempt at conservative therapy.  I explained the condition and procedure to the patient and answered any questions.  Patient wishes to proceed with procedure as planned. Understands risks/ benefits and typical outcomes of procedure.   Tia Alert 10/20/2022 7:03 AM

## 2022-10-21 DIAGNOSIS — M4316 Spondylolisthesis, lumbar region: Secondary | ICD-10-CM | POA: Diagnosis not present

## 2022-10-21 DIAGNOSIS — Z9889 Other specified postprocedural states: Secondary | ICD-10-CM

## 2022-10-21 HISTORY — DX: Other specified postprocedural states: Z98.890

## 2022-10-21 MED ORDER — OXYCODONE HCL 5 MG PO TABS: 5.0000 mg | ORAL_TABLET | ORAL | 0 refills | Status: DC | PRN

## 2022-10-21 MED ORDER — POLYETHYLENE GLYCOL 3350 17 G PO PACK: 17.0000 g | PACK | Freq: Every day | ORAL | Status: DC | PRN

## 2022-10-21 MED ORDER — METHOCARBAMOL 500 MG PO TABS: 500.0000 mg | ORAL_TABLET | Freq: Four times a day (QID) | ORAL | 1 refills | Status: AC | PRN

## 2022-10-21 MED FILL — Thrombin (Recombinant) For Soln 20000 Unit: CUTANEOUS | Qty: 1 | Status: AC

## 2022-10-21 NOTE — Evaluation (Signed)
Occupational Therapy Evaluation Patient Details Name: Corey Tate MRN: [id number] DOB: [birth year] Today's Date: 10/21/2022   History of Present Illness 82 yo M s/p PLIF.  PMH includes 6 back surgeries, R THA, R TSA, Arthritis, CKD, CAD, COPD.   Clinical Impression   Patient admitted for the procedure above.  PTA he lives at home with his spouse, who is recovering from a fall with THA.  Patient does not have to assist his wife physically, but does provide supportive assist.  The patient's daughter and granddaughter will be available to assist them, and he should do well.  Patient able to complete his own ADL, but needs reinforcement for back precautions during functional tasks.  Precautions reviewed, and ADL completed.  No further needs in the acute setting, and no post acute OT anticipated.  Recommend follow up as prescribed by MD.       Recommendations for follow up therapy are one component of a multi-disciplinary discharge planning process, led by the attending physician.  Recommendations may be updated based on patient status, additional functional criteria and insurance authorization.   Assistance Recommended at Discharge Set up Supervision/Assistance  Patient can return home with the following Assist for transportation;Assistance with cooking/housework    Functional Status Assessment  Patient has not had a recent decline in their functional status  Equipment Recommendations  None recommended by OT    Recommendations for Other Services       Precautions / Restrictions Precautions Precautions: Back Precaution Booklet Issued: Yes (comment) Precaution Comments: needs reinforcement during functional tasks Restrictions Weight Bearing Restrictions: No      Mobility Bed Mobility Overal bed mobility: Modified Independent                  Transfers Overall transfer level: Modified independent Equipment used: None                      Balance Overall balance  assessment: Mild deficits observed, not formally tested                                         ADL either performed or assessed with clinical judgement   ADL Overall ADL's : At baseline                                       General ADL Comments: Generalized supervision to restrict bending forward for LB ADL     Vision Patient Visual Report: No change from baseline       Perception     Praxis      Pertinent Vitals/Pain Pain Assessment Pain Assessment: Faces Faces Pain Scale: Hurts a little bit Pain Location: Incisional Pain Descriptors / Indicators: Aching Pain Intervention(s): Monitored during session     Hand Dominance Right   Extremity/Trunk Assessment Upper Extremity Assessment Upper Extremity Assessment: Overall WFL for tasks assessed   Lower Extremity Assessment Lower Extremity Assessment: Defer to PT evaluation   Cervical / Trunk Assessment Cervical / Trunk Assessment: Back Surgery   Communication Communication Communication: No difficulties;HOH   Cognition Arousal/Alertness: Awake/alert Behavior During Therapy: WFL for tasks assessed/performed Overall Cognitive Status: Within Functional Limits for tasks assessed  General Comments  VSS on RA     Exercises     Shoulder Instructions      Home Living Family/patient expects to be discharged to:: Private residence Living Arrangements: Spouse/significant other Available Help at Discharge: Family Type of Home: House Home Access: Stairs to enter Secretary/administrator of Steps: 1   Home Layout: One level     Bathroom Shower/Tub: Other (comment) (walk in seated tub with shower)   Bathroom Toilet: Standard Bathroom Accessibility: Yes How Accessible: Accessible via walker Home Equipment: Grab bars - tub/shower;Cane - single point;BSC/3in1          Prior Functioning/Environment Prior Level of Function :  Independent/Modified Independent;Driving                        OT Problem List: Pain      OT Treatment/Interventions:      OT Goals(Current goals can be found in the care plan section) Acute Rehab OT Goals Patient Stated Goal: Ready to return home OT Goal Formulation: With patient Time For Goal Achievement: 10/24/22 Potential to Achieve Goals: Good  OT Frequency:      Co-evaluation              AM-PAC OT "6 Clicks" Daily Activity     Outcome Measure Help from another person eating meals?: None Help from another person taking care of personal grooming?: None Help from another person toileting, which includes using toliet, bedpan, or urinal?: None Help from another person bathing (including washing, rinsing, drying)?: A Little Help from another person to put on and taking off regular upper body clothing?: None Help from another person to put on and taking off regular lower body clothing?: A Little 6 Click Score: 22   End of Session Nurse Communication: Mobility status  Activity Tolerance: Patient tolerated treatment well Patient left: in bed;with call bell/phone within reach  OT Visit Diagnosis: Unsteadiness on feet (R26.81)                Time: 1610-9604 OT Time Calculation (min): 21 min Charges:  OT General Charges $OT Visit: 1 Visit OT Evaluation $OT Eval Moderate Complexity: 1 Mod  10/21/2022  RP, OTR/L  Acute Rehabilitation Services  Office:  [phone]   Suzanna Obey 10/21/2022, 9:00 AM

## 2022-10-21 NOTE — Plan of Care (Signed)
Pt doing well. Pt and daughter given D/C instructions with verbal understanding. Rx's were sent to the pharmacy by MD. Pt's incision is clean and dry with no sign of infection. Pt's IV was removed prior to D/C. Pt D/C'd home via wheelchair per MD order. Pt is stable @ D/C and has no other needs at this time. Drue Camera, RN 

## 2022-10-21 NOTE — Evaluation (Signed)
Physical Therapy Evaluation  Patient Details Name: Corey Tate MRN: [id number] DOB: 1940-08-16 Today's Date: 10/21/2022  History of Present Illness  Pt is a 82 y/o male who presents s/p L2-L5 PLIF on 10/20/2022. PMH significant for 6 prior back surgeries, R THA, R TSA, CKD, CAD, COPD.   Clinical Impression  Pt admitted with above diagnosis. At the time of PT eval, pt was able to demonstrate transfers and ambulation with gross min guard assist and RW for support. Pt was educated on precautions, brace application/wearing schedule, appropriate activity progression, and car transfer. Pt currently with functional limitations due to the deficits listed below (see PT Problem List). Pt will benefit from skilled PT to increase their independence and safety with mobility to allow discharge to the venue listed below.         Recommendations for follow up therapy are one component of a multi-disciplinary discharge planning process, led by the attending physician.  Recommendations may be updated based on patient status, additional functional criteria and insurance authorization.  Follow Up Recommendations       Assistance Recommended at Discharge PRN  Patient can return home with the following  A little help with walking and/or transfers;A little help with bathing/dressing/bathroom;Assistance with cooking/housework;Assist for transportation;Help with stairs or ramp for entrance    Equipment Recommendations None recommended by PT  Recommendations for Other Services       Functional Status Assessment Patient has had a recent decline in their functional status and demonstrates the ability to make significant improvements in function in a reasonable and predictable amount of time.     Precautions / Restrictions Precautions Precautions: Back;Fall Precaution Booklet Issued: Yes (comment) Precaution Comments: Reviewed handout and pt was cued for precautions during functional mobility. Required Braces  or Orthoses: Spinal Brace Spinal Brace: Lumbar corset;Applied in sitting position Restrictions Weight Bearing Restrictions: No      Mobility  Bed Mobility Overal bed mobility: Modified Independent                  Transfers Overall transfer level: Needs assistance Equipment used: Rolling walker (2 wheels) Transfers: Sit to/from Stand Sit to Stand: Min guard           General transfer comment: VC's for hand placement on seated surface for safety and for improved posture during sit<>stand.    Ambulation/Gait Ambulation/Gait assistance: Min guard Gait Distance (Feet): 300 Feet Assistive device: Rolling walker (2 wheels) Gait Pattern/deviations: Step-through pattern, Decreased stride length, Trunk flexed Gait velocity: Decreased Gait velocity interpretation: 1.31 - 2.62 ft/sec, indicative of limited community ambulator   General Gait Details: VC's throughout for improved posture, closer walker proximity, and forward gaze. No assist required but tactile cues helpful for maintenance of upright posture.  Stairs            Wheelchair Mobility    Modified Rankin (Stroke Patients Only)       Balance Overall balance assessment: Mild deficits observed, not formally tested                                           Pertinent Vitals/Pain Pain Assessment Pain Assessment: Faces Faces Pain Scale: Hurts a little bit Pain Location: Incisional Pain Descriptors / Indicators: Aching Pain Intervention(s): Limited activity within patient's tolerance, Monitored during session, Repositioned    Home Living Family/patient expects to be discharged to:: Private residence Living Arrangements: Spouse/significant  other Available Help at Discharge: Family Type of Home: House Home Access: Stairs to enter   [employer] of Steps: 1   Home Layout: One level Home Equipment: Grab bars - tub/shower;Cane - single point;BSC/3in1      Prior Function  Prior Level of Function : Independent/Modified Independent;Driving                     Hand Dominance   Dominant Hand: Right    Extremity/Trunk Assessment   Upper Extremity Assessment Upper Extremity Assessment: Defer to OT evaluation    Lower Extremity Assessment Lower Extremity Assessment: Generalized weakness    Cervical / Trunk Assessment Cervical / Trunk Assessment: Back Surgery  Communication   Communication: No difficulties;HOH  Cognition Arousal/Alertness: Awake/alert Behavior During Therapy: WFL for tasks assessed/performed Overall Cognitive Status: Within Functional Limits for tasks assessed                                          General Comments      Exercises     Assessment/Plan    PT Assessment Patient needs continued PT services  PT Problem List Decreased strength;Decreased activity tolerance;Decreased balance;Decreased mobility;Decreased knowledge of use of DME;Decreased safety awareness;Decreased knowledge of precautions;Pain       PT Treatment Interventions DME instruction;Gait training;Functional mobility training;Therapeutic activities;Therapeutic exercise;Balance training;Patient/family education    PT Goals (Current goals can be found in the Care Plan section)  Acute Rehab PT Goals Patient Stated Goal: Home today PT Goal Formulation: With patient/family Time For Goal Achievement: 10/28/22 Potential to Achieve Goals: Good    Frequency Min 5X/week     Co-evaluation               AM-PAC PT "6 Clicks" Mobility  Outcome Measure Help needed turning from your back to your side while in a flat bed without using bedrails?: A Little Help needed moving from lying on your back to sitting on the side of a flat bed without using bedrails?: A Little Help needed moving to and from a bed to a chair (including a wheelchair)?: A Little Help needed standing up from a chair using your arms (e.g., wheelchair or bedside  chair)?: A Little Help needed to walk in hospital room?: A Little Help needed climbing 3-5 steps with a railing? : A Little 6 Click Score: 18    End of Session Equipment Utilized During Treatment: Gait belt;Back brace Activity Tolerance: Patient tolerated treatment well Patient left: in bed;with call bell/phone within reach;with family/visitor present Nurse Communication: Mobility status PT Visit Diagnosis: Unsteadiness on feet (R26.81);Pain Pain - part of body:  (back)    Time: 1003-1017 PT Time Calculation (min) (ACUTE ONLY): 14 min   Charges:   PT Evaluation $PT Eval Low Complexity: 1 Low          Conni Slipper, PT, DPT Acute Rehabilitation Services Secure Chat Preferred Office: [phone]   Marylynn Pearson 10/21/2022, 1:26 PM

## 2022-10-21 NOTE — Care Management CC44 (Signed)
Condition Code 44 Documentation Completed  Patient Details  Name: BARTLOMIEJ JENKINSON MRN: [id number] Date of Birth: 01-13-1941   Condition Code 44 given:  Yes Patient signature on Condition Code 44 notice:  Yes Documentation of 2 MD's agreement:  Yes Code 44 added to claim:  Yes    Lawerance Sabal, RN 10/21/2022, 8:08 AM

## 2022-10-21 NOTE — Care Management Obs Status (Signed)
MEDICARE OBSERVATION STATUS NOTIFICATION   Patient Details  Name: REINHARDT LICAUSI MRN: [id number] Date of Birth: [birth year]   Medicare Observation Status Notification Given:  Yes    Lawerance Sabal, RN 10/21/2022, 8:07 AM

## 2022-10-21 NOTE — Discharge Summary (Signed)
Physician Discharge Summary  Patient ID: Corey Tate MRN: [id number] DOB/AGE: 30-Mar-1941 82 y.o.  Admit date: 10/20/2022 Discharge date: 10/21/2022  Admission Diagnoses: spondylolisthesis with stenosis, lumbar    Discharge Diagnoses: same   Discharged Condition: good  Hospital Course: The patient was admitted on 10/20/2022 and taken to the operating room where the patient underwent decompression and instrumented fusion L3-5. The patient tolerated the procedure well and was taken to the recovery room and then to the floor in stable condition. The hospital course was routine. There were no complications. The wound remained clean dry and intact. Pt had appropriate back soreness. No complaints of leg pain or new N/T/W. The patient remained afebrile with stable vital signs, and tolerated a regular diet. The patient continued to increase activities, and pain was well controlled with oral pain medications.   Consults: None  Significant Diagnostic Studies:  Results for orders placed or performed during the hospital encounter of 10/14/22  Surgical pcr screen   Specimen: Nasal Mucosa; Nasal Swab  Result Value Ref Range   MRSA, PCR NEGATIVE NEGATIVE   Staphylococcus aureus NEGATIVE NEGATIVE  Protime-INR  Result Value Ref Range   Prothrombin Time 13.3 11.4 - 15.2 seconds   INR 1.0 0.8 - 1.2  Basic metabolic panel per protocol  Result Value Ref Range   Sodium 135 135 - 145 mmol/L   Potassium 3.9 3.5 - 5.1 mmol/L   Chloride 98 98 - 111 mmol/L   CO2 26 22 - 32 mmol/L   Glucose, Bld 143 (H) 70 - 99 mg/dL   BUN 18 8 - 23 mg/dL   Creatinine, Ser 4.09 0.61 - 1.24 mg/dL   Calcium 9.8 8.9 - 81.1 mg/dL   GFR, Estimated >91 >47 mL/min   Anion gap 11 5 - 15  CBC per protocol  Result Value Ref Range   WBC 8.0 4.0 - 10.5 K/uL   RBC 3.95 (L) 4.22 - 5.81 MIL/uL   Hemoglobin 12.9 (L) 13.0 - 17.0 g/dL   HCT 82.9 (L) 56.2 - 13.0 %   MCV 95.2 80.0 - 100.0 fL   MCH 32.7 26.0 - 34.0 pg   MCHC 34.3  30.0 - 36.0 g/dL   RDW 86.5 78.4 - 69.6 %   Platelets 178 150 - 400 K/uL   nRBC 0.0 0.0 - 0.2 %  Type and screen [facility]  Result Value Ref Range   ABO/RH(D) O POS    Antibody Screen NEG    Sample Expiration 10/28/2022,2359    Extend sample reason      NO TRANSFUSIONS OR PREGNANCY IN THE PAST 3 MONTHS Performed at [facility], [address], [place], [place] [address]     DG Lumbar Spine 2-3 Views  Result Date: 10/20/2022 CLINICAL DATA:  Laminectomy and foraminotomy. EXAM: LUMBAR SPINE - 2-3 VIEW COMPARISON:  Lumbar spine 05/27/2022 FINDINGS: Fluoroscopic images demonstrate bilateral pedicle screws at L3, L4 and L5. Radiation Exposure Index (as provided by the fluoroscopic device): 20.895 mGy Kerma IMPRESSION: Fluoroscopy for lumbar spine surgery. Electronically Signed   By: Richarda Overlie M.D.   On: 10/20/2022 09:47   DG C-Arm 1-60 Min-No Report  Result Date: 10/20/2022 Fluoroscopy was utilized by the requesting physician.  No radiographic interpretation.   DG C-Arm 1-60 Min-No Report  Result Date: 10/20/2022 Fluoroscopy was utilized by the requesting physician.  No radiographic interpretation.    Antibiotics:  Anti-infectives (From admission, onward)    Start     Dose/Rate Route  Frequency Ordered Stop   10/20/22 1600  ceFAZolin (ANCEF) IVPB 2g/100 mL premix        2 g 200 mL/hr over 30 Minutes Intravenous Every 8 hours 10/20/22 1256 10/20/22 2344   10/20/22 0843  vancomycin (VANCOCIN) powder  Status:  Discontinued          As needed 10/20/22 0844 10/20/22 1127   10/20/22 0600  ceFAZolin (ANCEF) IVPB 2g/100 mL premix        2 g 200 mL/hr over 30 Minutes Intravenous On call to O.R. 10/20/22 0555 10/20/22 0810   10/20/22 0559  ceFAZolin (ANCEF) 2-4 GM/100ML-% IVPB       Note to Pharmacy: Camillo Flaming: cabinet override      10/20/22 0559 10/20/22 0802       Discharge Exam: Blood pressure (!) 123/57, pulse 77, temperature 97.7 F (36.5 C),  temperature source Oral, resp. rate 16, height 5\' 8"  (1.727 m), weight 68.5 kg, SpO2 92 %. Neurologic: Grossly normal Dressing dry  Discharge Medications:   Allergies as of 10/21/2022       Reactions   Ferra-caps [iron] Hives   Streptomycin Hives, Itching        Medication List     TAKE these medications    acetaminophen 325 MG tablet Commonly known as: TYLENOL Take 650 mg by mouth 2 (two) times daily as needed for moderate pain, fever or headache.   ADVIL PM PO Take 1 tablet by mouth at bedtime as needed (sleep).   amLODipine 5 MG tablet Commonly known as: NORVASC Take 1 tablet (5 mg total) by mouth daily.   aspirin EC 81 MG tablet Take 81 mg by mouth daily.   Breztri Aerosphere 160-9-4.8 MCG/ACT Aero Generic drug: Budeson-Glycopyrrol-Formoterol Inhale 2 puffs into the lungs 2 (two) times daily.   cilostazol 100 MG tablet Commonly known as: PLETAL Take 100 mg by mouth 2 (two) times daily.   clopidogrel 75 MG tablet Commonly known as: Plavix Take 1 tablet (75 mg total) by mouth daily.   docusate sodium 100 MG capsule Commonly known as: COLACE Take 100 mg by mouth daily as needed (constipation).   ezetimibe 10 MG tablet Commonly known as: ZETIA Take 1 tablet (10 mg total) by mouth daily.   Fish Oil 1000 MG Caps Take 1,000 mg by mouth daily.   ibuprofen 200 MG tablet Commonly known as: ADVIL Take 200-400 mg by mouth daily as needed for headache or moderate pain.   K2 PLUS D3 PO Take 1 tablet by mouth daily.   lansoprazole 15 MG capsule Commonly known as: PREVACID Take 15 mg by mouth daily.   methocarbamol 500 MG tablet Commonly known as: ROBAXIN Take 1 tablet (500 mg total) by mouth every 6 (six) hours as needed for muscle spasms.   nitroGLYCERIN 0.4 MG SL tablet Commonly known as: Nitrostat Place 1 tablet (0.4 mg total) under the tongue every 5 (five) minutes as needed for chest pain (up to 3 doses. If taking 3rd dose call 911).   oxyCODONE 5  MG immediate release tablet Commonly known as: Oxy IR/ROXICODONE Take 1 tablet (5 mg total) by mouth every 4 (four) hours as needed for moderate pain ((score 4 to 6)).   OXYGEN Inhale 2 L into the lungs as needed.   pravastatin 40 MG tablet Commonly known as: PRAVACHOL Take 1 tablet (40 mg total) by mouth daily. What changed: when to take this  Durable Medical Equipment  (From admission, onward)           Start     Ordered   10/20/22 1308  DME Walker rolling  Once       Question:  Patient needs a walker to treat with the following condition  Answer:  S/P lumbar fusion   10/20/22 1307   10/20/22 1308  DME 3 n 1  Once        10/20/22 1307            Disposition: home   Final Dx: deconmpression and fusion L3-5  Discharge Instructions     Call MD for:  difficulty breathing, headache or visual disturbances   Complete by: As directed    Call MD for:  persistant nausea and vomiting   Complete by: As directed    Call MD for:  redness, tenderness, or signs of infection (pain, swelling, redness, odor or green/yellow discharge around incision site)   Complete by: As directed    Call MD for:  severe uncontrolled pain   Complete by: As directed    Call MD for:  temperature >100.4   Complete by: As directed    Diet - low sodium heart healthy   Complete by: As directed    Increase activity slowly   Complete by: As directed    Remove dressing in 48 hours   Complete by: As directed         Follow-up Information     Tia Alert, MD. Schedule an appointment as soon as possible for a visit in 2 week(s).   Specialty: Neurosurgery Contact information: [address] [place] [address] [phone]                  Signed: Tia Alert 10/21/2022, 7:50 AM

## 2022-12-14 NOTE — Progress Notes (Unsigned)
Cardiology Office Note:    Date:  12/15/2022   ID:  Corey Tate, Corey Tate 10/18/1940, MRN [id number]  PCP:  Erskine Emery, NP  Cardiologist:  Norman Herrlich, MD    Referring MD: Erskine Emery, NP    ASSESSMENT:    1. Coronary artery disease involving native coronary artery of native heart with angina pectoris (HCC)   2. 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 Dyslipidemia   4. 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 Bilateral carotid artery stenosis   6. COPD, severe (HCC)    PLAN:    In order of problems listed above:  From a cardiology perspective abdominal is doing well continue his dual antiplatelet therapy with diffuse vascular disease combined statin and Zetia for lipid-lowering therapy and he tells me he has intermittent episodes of blood pressure less than 90 and reduce his amlodipine to every other day.  At this time I do not think he requires an ischemia evaluation Labs for lipids renal function are followed by his PCP I cannot see them in Care Everywhere Vascular disease followed by [facility] vascular surgery Follows with [facility] pulmonary for COPD   Next appointment: 6 months   Medication Adjustments/Labs and Tests Ordered: Current medicines are reviewed at length with the patient today.  Concerns regarding medicines are outlined above.  No orders of the defined types were placed in this encounter.  No orders of the defined types were placed in this encounter.    History of Present Illness:    [name] is a 82 y.o. male with a hx of CAD with staged PCI and stent to the LAD and left circumflex coronary artery August 2021 hypertensive kidney disease stage II CKD severe COPD dyslipidemia abdominal aortic aneurysm and cerebrovascular disease with left carotid endarterectomy September 2023 last seen 06/10/2022.  He was seen by vascular surgery [facility] April 2024  his carotid duplex showed 60 to 79% bilateral stenosis performed at the [facility] his abdominal aortic aneurysm 38 mm by CT angiography October 2023  Compliance with diet, lifestyle and medications: Yes  He is improved after his back surgery but still is having claudication and follows with vascular surgery [facility]. Chronic shortness of breath not progressive does not wear oxygen and during the day but wears it at nighttime 2 L/min He is not having edema orthopnea chest pain palpitation or syncope He continues on dual antiplatelet therapy without GI side effects and tolerates combined statin and Zetia without muscle pain or weakness.  EKG 06/10/2022 showed sinus rhythm nonspecific ST-T abnormality Past Medical History:  Diagnosis Date   Anxiety 12/11/2017   Arthritis    Asthma    ?   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 01/2020- DFR-guided CSI orbital atherectomy with DES PCI of proximal LAD with staged orbital atherectomy and DES PCI of the mid and distal LCx.   Cancer [facility])    SKIN CANCERS   Carotid bruit 12/11/2017   CKD (chronic kidney disease), stage II 02/08/2020   COPD, severe (HCC) 03/04/2016   Coronary artery disease involving native coronary artery of native heart with angina pectoris (HCC) 01/31/2020   Cough 03/04/2016   Degenerative arthritis of hip 04/02/2012  DOE (dyspnea on exertion)  - as Angina Equivalent 01/31/2020   Dyslipidemia 09/26/2015   GERD (gastroesophageal reflux disease)    H/O varicella 12/11/2017   Hypertension    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   Nicotine dependence, uncomplicated 09/26/2015   Nocturnal hypoxemia 03/04/2016   PAF (paroxysmal atrial fibrillation) (HCC) 12/10/2016   Peripheral vascular disease (HCC)    Pneumonia 2021    Shortness of breath    USES OXYGEN AT NIGHT--HX OF RIGHT LOWER LOBE PULMONARY NODULE--FOLLOWED BY PT'S MEDICAL DOCTOR AND HAS HAD FOR YEARS   Smoking greater than 40 pack years 03/04/2016   Syncope 08/04/2016    Past Surgical History:  Procedure Laterality Date   ATHERECTOMY  01/31/2020   Successful DFR guided, CSI Orbital Atherectomy-DES PCI of proximal LA   BACK SURGERY     LOWER BACK SURGERY X 3 - FUSION   CARPAL TUNNEL RELEASE AND SURGERY LEFT ELBOW     CATARACT EXTRACTION     CERVICAL DISC SURGERY     FUSION - ONLY SLIGHT LIMITATION IN NECK MOVEMENT   CORONARY ATHERECTOMY N/A 01/31/2020   Procedure: CORONARY ATHERECTOMY;  Surgeon: Marykay Lex, MD;  Location: [facility] INVASIVE CV LAB;  Service: Cardiovascular;  Laterality: N/A;   CORONARY ATHERECTOMY N/A 02/08/2020   Procedure: CORONARY ATHERECTOMY;  Surgeon: Marykay Lex, MD;  Location: [facility] INVASIVE CV LAB;  Service: Cardiovascular;  Laterality: N/A;   CORONARY PRESSURE/FFR STUDY N/A 01/31/2020   Procedure: INTRAVASCULAR PRESSURE WIRE/FFR STUDY;  Surgeon: Marykay Lex, MD;  Location: [facility] INVASIVE CV LAB;  Service: Cardiovascular;  Laterality: N/A;   CORONARY STENT INTERVENTION N/A 01/31/2020   Procedure: CORONARY STENT INTERVENTION;  Surgeon: Marykay Lex, MD;  Location: [facility] INVASIVE CV LAB;  Service: Cardiovascular;  Laterality: N/A;   LAMINECTOMY WITH POSTERIOR LATERAL ARTHRODESIS LEVEL 3 Bilateral 10/20/2022   Procedure: Laminectomy and Foraminotomy - Lumbar two-Lumbar three - bilateral - Lumbar three-Lumbar four - right - Lumbar four-Lumbar five - right, posterolateral fusion Lumbar three-five with pedicle screw fixation Lumbar three-five;  Surgeon: Tia Alert, MD;  Location: [facility] OR;  Service: Neurosurgery;  Laterality: Bilateral;   LEFT HEART CATH N/A 02/08/2020   Procedure: Left Heart Cath;  Surgeon: Marykay Lex, MD;  Location: [facility] INVASIVE CV LAB;  Service: Cardiovascular;  Laterality: N/A;   LEFT HEART CATH AND  CORONARY ANGIOGRAPHY N/A 01/31/2020   Procedure: LEFT HEART CATH AND CORONARY ANGIOGRAPHY;  Surgeon: Marykay Lex, MD;  Location: [facility] INVASIVE CV LAB;  Service: Cardiovascular;  Laterality: N/A;   POSTERIOR CERVICAL FUSION/FORAMINOTOMY Bilateral 09/30/2012   Procedure: CERVICAL SEVEN AND THORACIC ONE BILATERAL POSTERIOR CERVICAL FUSION/FORAMINOTOMY ;  Surgeon: Tia Alert, MD;  Location: [facility] NEURO ORS;  Service: Neurosurgery;  Laterality: Bilateral;   RIGHT SHOULDER SURGERY     SINUS SURGERY WITH INSTATRAK     TOTAL HIP ARTHROPLASTY  04/02/2012   Procedure: TOTAL HIP ARTHROPLASTY ANTERIOR APPROACH;  Surgeon: Kathryne Hitch, MD;  Location: [facility] ORS;  Service: Orthopedics;  Laterality: Right;  Right Total Hip Arthroplasty   VASCULAR SURGERY     STENT PLACEMENT RIGHT LEG AND "ROTOR ROOTER" LEFT LEG    Current Medications: Current Meds  Medication Sig   acetaminophen (TYLENOL) 325 MG tablet Take 650 mg by mouth 2 (two) times daily as needed for moderate pain, fever or headache.   amLODipine (NORVASC) 5 MG tablet Take 1 tablet (5 mg total) by mouth daily.   aspirin EC 81 MG tablet Take 81 mg  by mouth daily.   BREZTRI AEROSPHERE 160-9-4.8 MCG/ACT AERO Inhale 2 puffs into the lungs 2 (two) times daily.   cilostazol (PLETAL) 100 MG tablet Take 100 mg by mouth 2 (two) times daily.   clopidogrel (PLAVIX) 75 MG tablet Take 1 tablet (75 mg total) by mouth daily.   docusate sodium (COLACE) 100 MG capsule Take 100 mg by mouth daily as needed (constipation).   ezetimibe (ZETIA) 10 MG tablet Take 1 tablet (10 mg total) by mouth daily.   gabapentin (NEURONTIN) 300 MG capsule Take 300 mg by mouth 2 (two) times daily.   ibuprofen (ADVIL) 200 MG tablet Take 200-400 mg by mouth daily as needed for headache or moderate pain.   Ibuprofen-diphenhydrAMINE Cit (ADVIL PM PO) Take 1 tablet by mouth at bedtime as needed (sleep).   lansoprazole (PREVACID) 15 MG capsule Take 15 mg by mouth daily.   methocarbamol  (ROBAXIN) 500 MG tablet Take 1 tablet (500 mg total) by mouth every 6 (six) hours as needed for muscle spasms.   nitroGLYCERIN (NITROSTAT) 0.4 MG SL tablet Place 1 tablet (0.4 mg total) under the tongue every 5 (five) minutes as needed for chest pain (up to 3 doses. If taking 3rd dose call 911).   Omega-3 Fatty Acids (FISH OIL) 1000 MG CAPS Take 1,000 mg by mouth daily.   oxyCODONE (OXY IR/ROXICODONE) 5 MG immediate release tablet Take 1 tablet (5 mg total) by mouth every 4 (four) hours as needed for moderate pain ((score 4 to 6)).   OXYGEN Inhale 2 L into the lungs as needed.   pravastatin (PRAVACHOL) 40 MG tablet Take 1 tablet (40 mg total) by mouth daily.   Vitamin D-Vitamin K (K2 PLUS D3 PO) Take 1 tablet by mouth daily.     Allergies:   Ferra-caps [iron] and Streptomycin   Social History   Socioeconomic History   Marital status: Married    Spouse name: Not on file   Number of children: Not on file   Years of education: Not on file   Highest education level: Not on file  Occupational History   Not on file  Tobacco Use   Smoking status: Some Days    Packs/day: 1.00    Years: 55.00    Additional pack years: 0.00    Total pack years: 55.00    Types: Cigarettes   Smokeless tobacco: Never  Vaping Use   Vaping Use: Never used  Substance and Sexual Activity   Alcohol use: Yes    Alcohol/week: 21.0 standard drinks of alcohol    Types: 21 Cans of beer per week    Comment: 3 BEERS A DAY   Drug use: No   Sexual activity: Not on file  Other Topics Concern   Not on file  Social History Narrative   Not on file   Social Determinants of Health   Financial Resource Strain: Not on file  Food Insecurity: Not on file  Transportation Needs: Not on file  Physical Activity: Not on file  Stress: Not on file  Social Connections: Not on file     Family History: The patient's family history includes Cancer in his father; Diabetes in his brother and sister. ROS:   Please see the  history of present illness.    All other systems reviewed and are negative.  EKGs/Labs/Other Studies Reviewed:    The following studies were reviewed today:  Cardiac Studies & Procedures   CARDIAC CATHETERIZATION  CARDIAC CATHETERIZATION 02/08/2020  Narrative Images from the  original result were not included.   Prox Cx lesion is 55% stenosed. 1st Mrg lesion is 50% stenosed.  Mid Cx lesion is 70% stenosed. Dist Cx lesion is 90% stenosed.  Orbital atherectomy followed by was performed on both lesions.  A drug-eluting stent was successfully placed covering both lesions, using a SYNERGY XD 2.75X28. Post dilated to 3.1 mm  Post intervention, there is a 0% residual stenosis throughout the stented segment.  LPAV lesion is 45% stenosed.  ------------------  Previously placed Prox LAD to Mid LAD drug eluting stent is widely patent.  Mid LAD lesion is 60% stenosed. Mid LAD to Dist LAD lesion is 50% stenosed.  ------------------  LV end diastolic pressure is mildly elevated.  There is no aortic valve stenosis.  SUMMARY  Successful orbital atherectomy and DES PCI of the mid and distal LCx covering both lesions with a single Synergy DES 2.75 mm x 28 mm postdilated to 2.9 mm  Borderline LVEDP     Bryan Lemma, M.D., M.S. Interventional Cardiologist  Pager # [phone] Phone # [phone] [address] [place], [place] [address]  Findings Coronary Findings Diagnostic  Dominance: Co-dominant  Left Anterior Descending There is moderate diffuse disease throughout the vessel. The vessel is moderately calcified. The vessel is severely tortuous. Previously placed Prox LAD to Mid LAD drug eluting stent is widely patent. The lesion is located proximal to the major branch, segmental and eccentric. The lesion is moderately calcified. Mid LAD lesion is 60% stenosed. The lesion is located at the bend and eccentric. The lesion is severely calcified. With this lesion  being an extreme bend, difficult to wire, I did not feel it was safe to do atherectomy in this calcified segment. Mid LAD to Dist LAD lesion is 50% stenosed. The lesion is located at the bend. The lesion is moderately calcified.  First Diagonal Branch Vessel is small in size.  First Septal Branch Vessel is moderate in size.  Second Diagonal Branch Vessel is small in size.  Second Septal Branch Vessel is small in size.  Ramus Intermedius Vessel is small.  Left Circumflex Vessel is large. There is mild diffuse disease throughout the vessel. Prox Cx lesion is 55% stenosed. The lesion is located proximal to the major branch and eccentric. The lesion is severely calcified. Mid Cx lesion is 70% stenosed. The lesion is severely calcified. Dist Cx lesion is 90% stenosed. The lesion is located proximal to the major branch, concentric and irregular. The lesion is calcified.  First Obtuse Marginal Branch Vessel is small in size. 1st Mrg lesion is 50% stenosed. The lesion is focal.  Second Left Posterolateral Branch Vessel is moderate in size.  Left Posterior Atrioventricular Artery LPAV lesion is 45% stenosed. The lesion is eccentric.  Intervention  Mid Cx lesion Atherectomy CATH VISTA GUIDE 6FR XBLAD3.5 guide catheter was inserted. WIRE VIPERWIRE COR FLEX TIP guidewire was used to cross lesion. Orbital atherectomy was performed using a CROWN DIAMONDBACK CLASSIC 1.25. 4 passes taken. Stent (Also treats lesions: Dist Cx) Lesion length:  26 mm. Lesion crossed with guidewire using a WIRE ASAHI PROWATER 180CM. Pre-stent angioplasty was performed using a BALLOON SAPPHIRE 2.5X12. Maximum pressure:  8 atm. Inflation time:  20 sec. A drug-eluting stent was successfully placed using a SYNERGY XD 2.75X28. Maximum pressure: 18 atm. Inflation time: 30 sec. Minimum lumen area:  3.1 mm. Stent strut is well apposed. Post-stent angioplasty was performed using a BALLOON SAPPHIRE [place] 3.0X12. Maximum  pressure:  18 atm. Inflation time: 20  sec. Post-Intervention Lesion Assessment The intervention was successful. Pre-interventional TIMI flow is 3. Post-intervention TIMI flow is 3. Treated lesion length:  28 mm. No complications occurred at this lesion. There is a 0% residual stenosis post intervention.  Dist Cx lesion Atherectomy Orbital atherectomy was performed using a CROWN DIAMONDBACK CLASSIC 1.25. 5 passes taken. Stent (Also treats lesions: Mid Cx) See details in Mid Cx lesion. Post-Intervention Lesion Assessment The intervention was successful. There is a 0% residual stenosis post intervention.   CARDIAC CATHETERIZATION  CARDIAC CATHETERIZATION 01/31/2020  Narrative  The left ventricular systolic function is normal. EF~55-65% by visual estimate. Normal LVEDP.  There is aortic valve calcification with no stenosis.  -  Prox LAD to Mid LAD lesion is 70% stenosed.  A drug-eluting stent was successfully placed using a SYNERGY XD 3.0X28.-Postdilated to 3.3 mm  Post intervention, there is a 0% residual stenosis.  Mid LAD lesion is 60% stenosed in the bend just after the stented segment, not good PCI targets.  Mid LAD to Dist LAD lesion is 50% stenosed.  -  Prox Cx lesion is 55% stenosed -prior to 1st Mrg. 1st Mrg lesion is 50% stenosed.  Mid Cx lesion is 70% stenosed. Dist Cx lesion is 90% stenosed.  LPAV lesion is 45% stenosed.  Prox RCA to Mid RCA lesion is 70% stenosed. Mid RCA lesion is 100% stenosed. -> Distal RCA fills via right-right collaterals  SUMMARY  Severe three-vessel CAD:  Diffuse mid RCA 70% followed by 100% CTO (distal RCA fills via right to right collaterals),  Heavily calcified Left Coronary Artery:]  LAD: 70% eccentric proximal LAD (DFR 0.90) followed by tandem sequential 60% and 50% lesions in a very tortuous calcified mid LAD on either side of 2 small diagonal branches,  Successful DFR guided, CSI Orbital Atherectomy-DES PCI of proximal  LAD: Synergy DES 3.0 mm x 28 mm -postdilated to 3.3 mm.  LCx: sequential calcified 50, 70 and 90% stenoses beginning just proximal to small but significant 1st Mrg.  Appears to be well preserved LVEF with no significant wall motion abnormalities on hand-injection LV gram.  Normal LVEDP  RECOMMENDATION  Based on the patient's significant comorbidities with severe COPD, PAD and carotid disease along with CKD-3B along with significant residual LCx disease and occluded RCA, I felt it prudent to monitor the patient overnight for hydration.  He is statin intolerant -may need to consider PCSK9 inhibitor.  We will plan staged CSI PCI of LCx 10am case on Wed 8/18.   Bryan Lemma, MD  Findings Coronary Findings Diagnostic  Dominance: Co-dominant  Left Anterior Descending There is moderate diffuse disease throughout the vessel. The vessel is moderately calcified. The vessel is severely tortuous. Prox LAD to Mid LAD lesion is 70% stenosed. The lesion is located proximal to the major branch, segmental and eccentric. The lesion is moderately calcified. Pressure wire/FFR was performed on the lesion. DFR 0.9 Mid LAD lesion is 60% stenosed. The lesion is located at the bend and eccentric. The lesion is severely calcified. Pressure wire/FFR was performed on the lesion. DFR 0.85 With this lesion being an extreme bend, difficult to wire, I did not feel it was safe to do atherectomy in this calcified segment. Mid LAD to Dist LAD lesion is 50% stenosed. The lesion is located at the bend. The lesion is moderately calcified.  First Diagonal Branch Vessel is small in size.  First Septal Branch Vessel is moderate in size.  Second Diagonal Branch Vessel is small in size.  Second  Septal Branch Vessel is small in size.  Ramus Intermedius Vessel is small.  Left Circumflex There is mild diffuse disease throughout the vessel. Prox Cx lesion is 55% stenosed. The lesion is located proximal to the major  branch and eccentric. The lesion is severely calcified. Mid Cx lesion is 70% stenosed. The lesion is severely calcified. Dist Cx lesion is 90% stenosed. The lesion is located proximal to the major branch, concentric and irregular. The lesion is calcified.  First Obtuse Marginal Branch Vessel is small in size. 1st Mrg lesion is 50% stenosed. The lesion is focal.  Second Left Posterolateral Branch Vessel is moderate in size.  Left Posterior Atrioventricular Artery LPAV lesion is 45% stenosed. The lesion is eccentric.  Right Coronary Artery Vessel is small. There is severe diffuse disease throughout the vessel. Prox RCA to Mid RCA lesion is 70% stenosed. The lesion is concentric. Tapers distally Mid RCA lesion is 100% stenosed. Vessel is not the culprit lesion. The lesion is chronically occluded with right-to-right collateral flow.  Acute Marginal Branch Vessel is small in size. Collaterals Acute Mrg filled by collaterals from RV Branch.  Right Ventricular Branch Vessel is small in size.  Right Posterior Descending Artery Vessel is small in size. There is mild disease in the vessel.  Right Posterior Atrioventricular Artery Vessel is small in size.  Intervention  Prox LAD to Mid LAD lesion Atherectomy CATH VISTA GUIDE 6FR XB3.5 guide catheter was inserted. WIRE VIPERWIRE COR FLEX TIP guidewire was used to cross lesion. Orbital atherectomy was performed using a CROWN DIAMONDBACK CLASSIC 1.25. 5 passes taken. Stent Lesion length:  24 mm. Lesion crossed with guidewire using a WIRE ASAHI PROWATER 180CM. Pre-stent angioplasty was performed using a BALLOON SAPPHIRE 2.5X15. Maximum pressure:  10 atm. Inflation time:  20 sec. A drug-eluting stent was successfully placed using a SYNERGY XD 3.0X28. Maximum pressure: 18 atm. Inflation time: 30 sec. Minimum lumen area:  3.3 mm. Stent strut is well apposed. Post-stent angioplasty was performed using a BALLOON SAPPHIRE [place] 3.25X15. Maximum  pressure:  18 atm. Inflation time: 20 sec. Post-Intervention Lesion Assessment The intervention was successful. Pre-interventional TIMI flow is 3. Post-intervention TIMI flow is 3. Treated lesion length:  28 mm. No complications occurred at this lesion. There is a 0% residual stenosis post intervention.       MONITORS  LONG TERM MONITOR (3-14 DAYS) 09/14/2018  Narrative A Zio monitor was performed 7 days 6 hours beginning 09/01/2018 to assess syncope.  The rhythm throughout is sinus with minimum average and maximum heart rates of 40, 79, and 136 bpm.  The minimum rate was sinus bradycardia.  Ventricular ectopy was occasional 1.8% burden of PVCs with rare couplet and triplet.  Supraventricular ectopy was frequent with frequent APCs and rare couplets and triplets.  There were no episodes of atrial fibrillation or flutter.  There were no episodes of second or third-degree A-V block or sinus node exit block and no episodes of pauses greater than 3 seconds.  There were no symptomatic or triggered events   Conclusion, frequent supraventricular ectopy occasional ventricular ectopy no significant bradycardia to account for syncope.   CT SCANS  CT CORONARY MORPH W/CTA COR W/SCORE 01/04/2020  Addendum 01/04/2020  9:41 PM ADDENDUM REPORT: 01/04/2020 21:38  CLINICAL DATA:  82 year old male with hypertensin and bilateral carotid disease.  EXAM: Cardiac/Coronary  CT  TECHNIQUE: The patient was scanned on a [employer].  FINDINGS: A 120 kV prospective scan was triggered in the descending  thoracic aorta at 111 HU's. Axial non-contrast 3 mm slices were carried out through the heart. The data set was analyzed on a dedicated work station and scored using the Agatson method. Gantry rotation speed was 250 msecs and collimation was .6 mm. No beta blockade and 0.8 mg of sl NTG was given. The 3D data set was reconstructed in 5% intervals of the 67-82 % of the R-R cycle. Diastolic  phases were analyzed on a dedicated work station using MPR, MIP and VRT modes. The patient received 80 cc of contrast.  Aorta: Normal size. There is mild aortic root calcifications. No dissection.  Aortic Valve:  Trileaflet.  There is mild calcifications.  Coronary Arteries:  Normal coronary origin.  Right dominance.  RCA is a large dominant artery that gives rise to PDA and PLVB. There is significant diffuse severe (>70%) calcified plaques through out the RCA. There is a noncalcified lesion right above the bifurcation of the PLVB and PDA which appears to be close to 99% occluded.  Left main is a large artery that gives rise to LAD and LCX arteries.  LAD is a large vessel. The proximal LAD with a severe (>70%) calcified plaque. The Mid to Distal LAD is a long tubular severe (>70%) calcified plaque. There is minimal diffuse calcified plaque in the LAD.  LCX is a non-dominant artery that gives rise to one large OM1 branch. There is a proximal moderate (50-69%) calcified plaque. The mid to distal LCX with severe (>70%) calcified plaque. The distal LCX with moderate (50-69%) calcified plaque.  Other findings:  Normal pulmonary vein drainage into the left atrium.  Normal left atrial appendage without a thrombus.  Normal size of the pulmonary artery.  Mitral annular calcification.  IMPRESSION: 1. Severe Multi-vessel Coronary artery disease. CADRADS 4A. Recommend cardiac catheterization for revascularization consideration.  2. Coronary calcium score of 3211. This was 55 percentile for age and sex matched control.  2. Normal coronary origin with right dominance.  3. Aortic atherosclerosis.  4. Mild Mitral annular calcification.  Thomasene Ripple, DO   Electronically Signed By: Thomasene Ripple MD On: 01/04/2020 21:38  Narrative EXAM: OVER-READ INTERPRETATION  CT CHEST  The following report is an over-read performed by radiologist Dr. Charlett Nose of [facility] on 01/04/2020. This over-read does not include interpretation of cardiac or coronary anatomy or pathology. The coronary CTA interpretation by the cardiologist is attached.  COMPARISON:  12/05/2019  FINDINGS: Vascular: Heavily calcified aorta. No aneurysm with maximum diameter of the visualized ascending aorta 3.4 cm.  Mediastinum/Nodes: No adenopathy.  Lungs/Pleura: Platelike areas of atelectasis in the right middle lobe. Mild elevation of the right hemidiaphragm. No confluent opacities or effusions.  Upper Abdomen: Imaging into the upper abdomen shows no acute findings.  Musculoskeletal: Chest wall soft tissues are unremarkable. No acute bony abnormality.  IMPRESSION: Heavily calcified aorta.  No aneurysm.  No acute extra cardiac abnormality.  Electronically Signed: By: Charlett Nose M.D. On: 01/04/2020 13:06            Recent Labs: 10/14/2022: BUN 18; Creatinine, Ser 1.19; Hemoglobin 12.9; Platelets 178; Potassium 3.9; Sodium 135  Recent Lipid Panel    Component Value Date/Time   CHOL 156 08/24/2018 1510   TRIG 69 08/24/2018 1510   HDL 62 08/24/2018 1510   CHOLHDL 2.5 08/24/2018 1510   LDLCALC 80 08/24/2018 1510    Physical Exam:    VS:  BP (!) 84/50 (BP Location: Left Arm, Patient Position: Sitting, Cuff Size:  Normal)   Pulse 80   Ht 5\' 8"  (1.727 m)   Wt 146 lb (66.2 kg)   SpO2 99%   BMI 22.20 kg/m     Wt Readings from Last 3 Encounters:  12/15/22 146 lb (66.2 kg)  10/20/22 151 lb (68.5 kg)  10/14/22 150 lb 8 oz (68.3 kg)     GEN:  Well nourished, well developed in no acute distress HEENT: Normal NECK: No JVD; No carotid bruits LYMPHATICS: No lymphadenopathy CARDIAC: RRR, no murmurs, rubs, gallops RESPIRATORY:  Clear to auscultation without rales, wheezing or rhonchi  ABDOMEN: Soft, non-tender, non-distended MUSCULOSKELETAL:  No edema; No deformity  SKIN: Warm and dry NEUROLOGIC:  Alert and oriented x 3 PSYCHIATRIC:  Normal  affect    Signed, Norman Herrlich, MD  12/15/2022 2:52 PM    [facility]

## 2022-12-15 ENCOUNTER — Ambulatory Visit: Payer: Medicare Other | Attending: Cardiology | Admitting: Cardiology

## 2022-12-15 ENCOUNTER — Encounter: Payer: Self-pay | Admitting: Cardiology

## 2022-12-15 VITALS — BP 132/80 | HR 80 | Ht 68.0 in | Wt 146.0 lb

## 2022-12-15 DIAGNOSIS — I25119 Atherosclerotic heart disease of native coronary artery with unspecified angina pectoris: Secondary | ICD-10-CM

## 2022-12-15 DIAGNOSIS — I6523 Occlusion and stenosis of bilateral carotid arteries: Secondary | ICD-10-CM

## 2022-12-15 DIAGNOSIS — N182 Chronic kidney disease, stage 2 (mild): Secondary | ICD-10-CM | POA: Diagnosis present

## 2022-12-15 DIAGNOSIS — E785 Hyperlipidemia, unspecified: Secondary | ICD-10-CM | POA: Diagnosis present

## 2022-12-15 DIAGNOSIS — I129 Hypertensive chronic kidney disease with stage 1 through stage 4 chronic kidney disease, or unspecified chronic kidney disease: Secondary | ICD-10-CM

## 2022-12-15 DIAGNOSIS — M961 Postlaminectomy syndrome, not elsewhere classified: Secondary | ICD-10-CM

## 2022-12-15 DIAGNOSIS — J449 Chronic obstructive pulmonary disease, unspecified: Secondary | ICD-10-CM | POA: Diagnosis present

## 2022-12-15 DIAGNOSIS — I7142 Juxtarenal abdominal aortic aneurysm, without rupture: Secondary | ICD-10-CM | POA: Diagnosis present

## 2022-12-15 HISTORY — DX: Postlaminectomy syndrome, not elsewhere classified: M96.1

## 2022-12-15 MED ORDER — AMLODIPINE BESYLATE 5 MG PO TABS: 5.0000 mg | ORAL_TABLET | ORAL | 3 refills | Status: DC

## 2022-12-15 NOTE — Patient Instructions (Signed)
Medication Instructions:  Your physician has recommended you make the following change in your medication:   START: Amlodipine 5 mg every other day  *If you need a refill on your cardiac medications before your next appointment, please call your pharmacy*   Lab Work: None If you have labs (blood work) drawn today and your tests are completely normal, you will receive your results only by: MyChart Message (if you have MyChart) OR A paper copy in the mail If you have any lab test that is abnormal or we need to change your treatment, we will call you to review the results.   Testing/Procedures: None   Follow-Up: At [facility], you and your health needs are our priority.  As part of our continuing mission to provide you with exceptional heart care, we have created designated Provider Care Teams.  These Care Teams include your primary Cardiologist (physician) and Advanced Practice Providers (APPs -  Physician Assistants and Nurse Practitioners) who all work together to provide you with the care you need, when you need it.  We recommend signing up for the patient portal called "MyChart".  Sign up information is provided on this After Visit Summary.  MyChart is used to connect with patients for Virtual Visits (Telemedicine).  Patients are able to view lab/test results, encounter notes, upcoming appointments, etc.  Non-urgent messages can be sent to your provider as well.   To learn more about what you can do with MyChart, go to [email or web address].    Your next appointment:   6 month(s)  Provider:   Norman Herrlich, MD    Other Instructions None

## 2023-05-28 DIAGNOSIS — I714 Abdominal aortic aneurysm, without rupture, unspecified: Secondary | ICD-10-CM

## 2023-05-28 DIAGNOSIS — C3411 Malignant neoplasm of upper lobe, right bronchus or lung: Secondary | ICD-10-CM | POA: Insufficient documentation

## 2023-05-28 HISTORY — DX: Abdominal aortic aneurysm, without rupture, unspecified: I71.40

## 2023-07-07 NOTE — Progress Notes (Signed)
 Radiation Oncology SBRT Clinical Treatment Planning Note  Patient Name: Corey Tate Patient Age: 83 y.o. 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 Refer to the consult for full clinical details.  I plan to treat with photons utilizing SBRT technique.   The radiation target area/treatment site will be the lung.   I will attempt to minimize the dose to the surrounding normal lung.  The total radiation dose will be 54-55Gy in 3-5 fractions,treated daily or every other day as long as dose constraints are met with this fractionation regimen.  If dose constraints are not met, then modification of the fractionation regimen will occur.  Technique Rationale:   STEREOTACTIC BODY RADIOTHERAPY:  The use of SBRT is anticipated to be necessary to deliver high dose per fraction and minimuze dose to surrounding normal lungs.  Simulation Order: I requested radiation treatment planning CT scan to acquire information regarding patient's anatomy of the treatment site, radiation treatment target volumes, and adjacent normal organs. This is required to be done in patient's treatment position for radiation treatment planning and for patient's subsequent radiation treatment positioning and treatment setups. .   Image Fusion:  I have ordered that the following additional imaging studies be fused to the CT simulation scan to aid in tumor mapping and treatment planning: CT at maximal inspiration and expiration.  Special Treatment Procedure: Treatment planning for Corey Tate is more complex and requires more time because the patient will be treated with high dose per fraction SBRT.   SIMULATION:  Type:  Initial simulation of the lungs.  Contrast: None  CT SCAN FOR TREATMENT PLANNING: The CT Scan is performed with 1 mm slice thickness with the body site and range to be scanned from above the thoracic inlet through  the diaphragm, not to exceed 500 slices.   SPECIAL CONSIDERATIONS FOR THE CT SCAN FOR TREATMENT PLANNING: The scan is performed with a freebreathing technique through the region of interest utilizing the [employer] respiratory cycle monitor system.  IMMOBILIZATION/POSITIONING FOR TREATMENT PLANNING: The patient was immobilized with a BodyFixx Vacu-Loc in the supine position with the arms above the head.   Respiratory Motion:  In terms of respiratory motion, respiratory motion was accounted for using 4D CT. In order to take into account motion of the tumor target and comprehensively treat the disease in the chest, we utilized 4DCT imaging to account for the respiratory motion of the target lesion. This process involves recording the real-time breathing cycle signal simultaneously while multiple overlapping CT slice images are acquired at high speed as the simulator table moves longitudinally through the scanner. This multi-image technique is known as oversampling. Because multiple images are obtained for each axial level in the patient, the appearance of each given axial level can be known for any point within the breathing cycle as images are sorted (or binned) and assembled into sets that represent the anatomic appearance at discrete phases of the breathing cycle (typically representing 0%-100% inhalation, at 10% increments). Once the image sets are assembled, we reviewed the cine (movie-like) playback sequence of image sets in three planes (axial, coronal, sagittal) to verify that the sets faithfully represent the smooth and continuous tumor motion throughout the breathing cycles without unacceptable artifact. Using this data, we will contour the tumor volumes and establish the treatment volume, which has accounted for the respiratory motion as an ITV. This process is necessary to generate the highest quality treatment plan  possible for this patient to minimize unnecessary radiation dose to surrounding normal  tissue and ensure that we are fully treating the mobile target lesion.  I have placed an isocenter/localization point in three dimensions on these images.  This was marked on the patient's skin for subsequent radiation treatment set-up.  Additional details of the CT simulation are available in the departmental [facility] electronic medical record.  CT images were then transferred to the radiation treatment-planning computer for planning and dosimetry.

## 2023-07-16 ENCOUNTER — Encounter: Payer: Self-pay | Admitting: Cardiology

## 2023-07-19 NOTE — Progress Notes (Unsigned)
Cardiology Office Note:    Date:  07/20/2023   ID:  Corey, Tate June 15, 1941, MRN [id number]  PCP:  Corey Emery, NP  Cardiologist:  Corey Herrlich, MD    Referring MD: Corey Emery, NP    ASSESSMENT:    1. Coronary artery disease involving native coronary artery of native heart with angina pectoris (HCC)   2. Dyslipidemia   3. 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 COPD, severe (HCC)   5. 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 Bilateral carotid artery stenosis   7. Malignant neoplasm of upper lobe of right lung (HCC)    PLAN:    In order of problems listed above:  From a cardiology perspective Corey Tate continues to do well with CAD having no anginal discomfort in view of his other medical problems lung cancer pending XRT and further vascular evaluation intervention I would not pursue cardiac diagnostic testing continue his treatment which includes long-term dual antiplatelet therapy lipid-lowering pravastatin nitroglycerin if needed along with his calcium channel blocker Lipids are followed in his PCP office LDL is at target Hyper tension controlled CKD followed by his his PCP PAD carotid disease abdominal aortic aneurysm managed by vascular surgery He is to initiate XRT  Next appointment: 1 year   Medication Adjustments/Labs and Tests Ordered: Current medicines are reviewed at length with the patient today.  Concerns regarding medicines are outlined above.  Orders Placed This Encounter  Procedures   EKG 12-Lead   No orders of the defined types were placed in this encounter.    History of Present Illness:    Corey Tate is a 83 y.o. male with a hx of CAD with staged PCI and stent LAD and left circumflex coronary artery in August 2021 hypertensive kidney disease stage II CKD severe COPD dyslipidemia previous left carotid endarterectomy stable abdominal aortic aneurysm 38 mm and dyslipidemia last seen  12/15/2022.  Recently found to have cancer upper lobe with right lung was plan for radiation therapy.  Follows with vascular surgery Corey Tate most recent carotid duplex with previous left carotid endarterectomy.  His last evaluation abdominal aorta 38 mm maximum diameter CT October 2023.  Compliance with diet, lifestyle and medications: Yes  I was inclined to order duplex of abdominal aorta but he tells me he has an appointment for vascular surgery later this week With his underlying lung disease he short of breath with any activity he continues to smoke Fortunately he is not having edema orthopnea chest pain has not needed nitroglycerin no palpitations syncope or TIA He is bothered by what seems to be claudication in the right leg He will be initiating radiation therapy Labs are followed in his PCP office for his lipids.  Currently he tolerates pravastatin Past Medical History:  Diagnosis Date   Abdominal aortic aneurysm (AAA) without rupture (HCC) 05/28/2023   Abnormal cardiac CT angiography 01/31/2020   Anxiety 12/11/2017   Arthritis    Asthma    ?   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 01/2020- DFR-guided CSI orbital atherectomy with DES PCI of proximal LAD with staged orbital atherectomy and DES PCI of the mid and distal LCx.   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   GERD (gastroesophageal reflux disease)    H/O varicella 12/11/2017   History of atrial fibrillation 12/10/2016   HTN (hypertension) 02/08/2020   Hypertension    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   Lumbar spondylosis 11/06/2014   Neck mass 07/15/2022   Nicotine dependence, uncomplicated 09/26/2015   Nocturnal hypoxemia 03/04/2016   PAF (paroxysmal atrial fibrillation) (HCC) 12/10/2016   Peripheral vascular disease (HCC)    Pneumonia 2021   S/P lumbar fusion 10/20/2022   S/P lumbar laminectomy 10/21/2022   Sebaceous cyst 07/15/2022   Shortness of breath    USES OXYGEN AT NIGHT--HX OF RIGHT LOWER LOBE PULMONARY NODULE--FOLLOWED BY PT'S MEDICAL DOCTOR AND HAS HAD FOR YEARS   Smoking greater than 40 pack years 03/04/2016   Spinal stenosis of lumbar region 10/02/2014   Spondylolisthesis 10/16/2014   Syncope 08/04/2016    Current Medications: Current Meds  Medication Sig   acetaminophen (TYLENOL) 325 MG tablet Take 650 mg by mouth 2 (two) times daily as needed for moderate pain, fever or headache.   amLODipine (NORVASC) 5 MG tablet Take 1 tablet (5 mg total) by mouth every other day.   aspirin EC 81 MG tablet Take 81 mg by mouth daily.   BREZTRI AEROSPHERE 160-9-4.8 MCG/ACT AERO Inhale 2 puffs into the lungs 2 (two) times daily.   clopidogrel (PLAVIX) 75 MG tablet Take 1 tablet (75 mg total) by mouth daily.   docusate sodium (COLACE) 100 MG capsule Take 100 mg by mouth daily as needed (constipation).   doxycycline (VIBRAMYCIN) 100 MG capsule Take 100 mg by mouth every evening.   ezetimibe (ZETIA) 10 MG tablet Take 10 mg by mouth daily.   gabapentin (NEURONTIN) 300 MG capsule Take 300 mg by mouth 2 (two) times daily.   ibuprofen (ADVIL) 200 MG tablet Take 200-400 mg by mouth daily as needed for headache or moderate pain.    Ibuprofen-diphenhydrAMINE Cit (ADVIL PM PO) Take 1 tablet by mouth at bedtime as needed (sleep).   lansoprazole (PREVACID) 15 MG capsule Take 15 mg by mouth daily.   methocarbamol (ROBAXIN) 500 MG tablet Take 1 tablet (500 mg total) by mouth every 6 (six) hours as needed for muscle spasms.   nitroGLYCERIN (NITROSTAT) 0.4 MG SL tablet Place 0.4 mg under the tongue every 5 (five) minutes as needed for chest pain.   Omega-3 Fatty Acids (FISH OIL) 1000 MG CAPS Take 1,000 mg by mouth daily.   OXYGEN Inhale 2 L into the lungs at bedtime.   pantoprazole (PROTONIX) 40 MG tablet Take 1 tablet by mouth daily.   pravastatin (PRAVACHOL) 40 MG tablet Take 1 tablet (40 mg total) by mouth daily.   Vitamin D-Vitamin K (K2 PLUS D3 PO) Take 1 tablet by mouth daily.      EKGs/Labs/Other Studies Reviewed:    The following studies were reviewed today:  Cardiac Studies & Procedures   CARDIAC CATHETERIZATION  CARDIAC CATHETERIZATION 02/08/2020  Narrative Images from the original result were  not included.   Prox Cx lesion is 55% stenosed. 1st Mrg lesion is 50% stenosed.  Mid Cx lesion is 70% stenosed. Dist Cx lesion is 90% stenosed.  Orbital atherectomy followed by was performed on both lesions.  A drug-eluting stent was successfully placed covering both lesions, using a SYNERGY XD 2.75X28. Post dilated to 3.1 mm  Post intervention, there is a 0% residual stenosis throughout the stented segment.  LPAV lesion is 45% stenosed.  ------------------  Previously placed Prox LAD to Mid LAD drug eluting stent is widely patent.  Mid LAD lesion is 60% stenosed. Mid LAD to Dist LAD lesion is 50% stenosed.  ------------------  LV end diastolic pressure is mildly elevated.  There is no aortic valve stenosis.  SUMMARY  Successful orbital atherectomy and DES PCI of the mid and distal LCx covering both lesions with a single Synergy DES 2.75 mm x 28 mm postdilated to 2.9 mm  Borderline  LVEDP     Bryan Lemma, M.D., M.S. Interventional Cardiologist  Pager # [phone] Phone # [phone] [address] [place], [place] [address]  Findings Coronary Findings Diagnostic  Dominance: Co-dominant  Left Anterior Descending There is moderate diffuse disease throughout the vessel. The vessel is moderately calcified. The vessel is severely tortuous. Previously placed Prox LAD to Mid LAD drug eluting stent is widely patent. The lesion is located proximal to the major branch, segmental and eccentric. The lesion is moderately calcified. Mid LAD lesion is 60% stenosed. The lesion is located at the bend and eccentric. The lesion is severely calcified. With this lesion being an extreme bend, difficult to wire, I did not feel it was safe to do atherectomy in this calcified segment. Mid LAD to Dist LAD lesion is 50% stenosed. The lesion is located at the bend. The lesion is moderately calcified.  First Diagonal Branch Vessel is small in size.  First Septal Branch Vessel is moderate in size.  Second Diagonal Branch Vessel is small in size.  Second Septal Branch Vessel is small in size.  Ramus Intermedius Vessel is small.  Left Circumflex Vessel is large. There is mild diffuse disease throughout the vessel. Prox Cx lesion is 55% stenosed. The lesion is located proximal to the major branch and eccentric. The lesion is severely calcified. Mid Cx lesion is 70% stenosed. The lesion is severely calcified. Dist Cx lesion is 90% stenosed. The lesion is located proximal to the major branch, concentric and irregular. The lesion is calcified.  First Obtuse Marginal Branch Vessel is small in size. 1st Mrg lesion is 50% stenosed. The lesion is focal.  Second Left Posterolateral Branch Vessel is moderate in size.  Left Posterior Atrioventricular Artery LPAV lesion is 45% stenosed. The lesion is eccentric.  Intervention  Mid Cx lesion Atherectomy CATH VISTA  GUIDE 6FR XBLAD3.5 guide catheter was inserted. WIRE VIPERWIRE COR FLEX TIP guidewire was used to cross lesion. Orbital atherectomy was performed using a CROWN DIAMONDBACK CLASSIC 1.25. 4 passes taken. Stent (Also treats lesions: Dist Cx) Lesion length:  26 mm. Lesion crossed with guidewire using a WIRE ASAHI PROWATER 180CM. Pre-stent angioplasty was performed using a BALLOON SAPPHIRE 2.5X12. Maximum pressure:  8 atm. Inflation time:  20 sec. A drug-eluting stent was successfully placed using a SYNERGY XD 2.75X28. Maximum pressure: 18 atm. Inflation time: 30 sec. Minimum lumen area:  3.1 mm. Stent strut is well apposed. Post-stent angioplasty was performed using a BALLOON SAPPHIRE [place] 3.0X12. Maximum pressure:  18 atm. Inflation time: 20 sec. Post-Intervention Lesion  Assessment The intervention was successful. Pre-interventional TIMI flow is 3. Post-intervention TIMI flow is 3. Treated lesion length:  28 mm. No complications occurred at this lesion. There is a 0% residual stenosis post intervention.  Dist Cx lesion Atherectomy Orbital atherectomy was performed using a CROWN DIAMONDBACK CLASSIC 1.25. 5 passes taken. Stent (Also treats lesions: Mid Cx) See details in Mid Cx lesion. Post-Intervention Lesion Assessment The intervention was successful. There is a 0% residual stenosis post intervention.   CARDIAC CATHETERIZATION  CARDIAC CATHETERIZATION 01/31/2020  Narrative  The left ventricular systolic function is normal. EF~55-65% by visual estimate. Normal LVEDP.  There is aortic valve calcification with no stenosis.  -  Prox LAD to Mid LAD lesion is 70% stenosed.  A drug-eluting stent was successfully placed using a SYNERGY XD 3.0X28.-Postdilated to 3.3 mm  Post intervention, there is a 0% residual stenosis.  Mid LAD lesion is 60% stenosed in the bend just after the stented segment, not good PCI targets.  Mid LAD to Dist LAD lesion is 50% stenosed.  -  Prox Cx lesion is 55%  stenosed -prior to 1st Mrg. 1st Mrg lesion is 50% stenosed.  Mid Cx lesion is 70% stenosed. Dist Cx lesion is 90% stenosed.  LPAV lesion is 45% stenosed.  Prox RCA to Mid RCA lesion is 70% stenosed. Mid RCA lesion is 100% stenosed. -> Distal RCA fills via right-right collaterals  SUMMARY  Severe three-vessel CAD:  Diffuse mid RCA 70% followed by 100% CTO (distal RCA fills via right to right collaterals),  Heavily calcified Left Coronary Artery:]  LAD: 70% eccentric proximal LAD (DFR 0.90) followed by tandem sequential 60% and 50% lesions in a very tortuous calcified mid LAD on either side of 2 small diagonal branches,  Successful DFR guided, CSI Orbital Atherectomy-DES PCI of proximal LAD: Synergy DES 3.0 mm x 28 mm -postdilated to 3.3 mm.  LCx: sequential calcified 50, 70 and 90% stenoses beginning just proximal to small but significant 1st Mrg.  Appears to be well preserved LVEF with no significant wall motion abnormalities on hand-injection LV gram.  Normal LVEDP  RECOMMENDATION  Based on the patient's significant comorbidities with severe COPD, PAD and carotid disease along with CKD-3B along with significant residual LCx disease and occluded RCA, I felt it prudent to monitor the patient overnight for hydration.  He is statin intolerant -may need to consider PCSK9 inhibitor.  We will plan staged CSI PCI of LCx 10am case on Wed 8/18.   Bryan Lemma, MD  Findings Coronary Findings Diagnostic  Dominance: Co-dominant  Left Anterior Descending There is moderate diffuse disease throughout the vessel. The vessel is moderately calcified. The vessel is severely tortuous. Prox LAD to Mid LAD lesion is 70% stenosed. The lesion is located proximal to the major branch, segmental and eccentric. The lesion is moderately calcified. Pressure wire/FFR was performed on the lesion. DFR 0.9 Mid LAD lesion is 60% stenosed. The lesion is located at the bend and eccentric. The lesion is  severely calcified. Pressure wire/FFR was performed on the lesion. DFR 0.85 With this lesion being an extreme bend, difficult to wire, I did not feel it was safe to do atherectomy in this calcified segment. Mid LAD to Dist LAD lesion is 50% stenosed. The lesion is located at the bend. The lesion is moderately calcified.  First Diagonal Branch Vessel is small in size.  First Septal Branch Vessel is moderate in size.  Second Diagonal Branch Vessel is small in size.  Second Septal [employer]  Vessel is small in size.  Ramus Intermedius Vessel is small.  Left Circumflex There is mild diffuse disease throughout the vessel. Prox Cx lesion is 55% stenosed. The lesion is located proximal to the major branch and eccentric. The lesion is severely calcified. Mid Cx lesion is 70% stenosed. The lesion is severely calcified. Dist Cx lesion is 90% stenosed. The lesion is located proximal to the major branch, concentric and irregular. The lesion is calcified.  First Obtuse Marginal Branch Vessel is small in size. 1st Mrg lesion is 50% stenosed. The lesion is focal.  Second Left Posterolateral Branch Vessel is moderate in size.  Left Posterior Atrioventricular Artery LPAV lesion is 45% stenosed. The lesion is eccentric.  Right Coronary Artery Vessel is small. There is severe diffuse disease throughout the vessel. Prox RCA to Mid RCA lesion is 70% stenosed. The lesion is concentric. Tapers distally Mid RCA lesion is 100% stenosed. Vessel is not the culprit lesion. The lesion is chronically occluded with right-to-right collateral flow.  Acute Marginal Branch Vessel is small in size. Collaterals Acute Mrg filled by collaterals from RV Branch.  Right Ventricular Branch Vessel is small in size.  Right Posterior Descending Artery Vessel is small in size. There is mild disease in the vessel.  Right Posterior Atrioventricular Artery Vessel is small in size.  Intervention  Prox LAD to Mid LAD  lesion Atherectomy CATH VISTA GUIDE 6FR XB3.5 guide catheter was inserted. WIRE VIPERWIRE COR FLEX TIP guidewire was used to cross lesion. Orbital atherectomy was performed using a CROWN DIAMONDBACK CLASSIC 1.25. 5 passes taken. Stent Lesion length:  24 mm. Lesion crossed with guidewire using a WIRE ASAHI PROWATER 180CM. Pre-stent angioplasty was performed using a BALLOON SAPPHIRE 2.5X15. Maximum pressure:  10 atm. Inflation time:  20 sec. A drug-eluting stent was successfully placed using a SYNERGY XD 3.0X28. Maximum pressure: 18 atm. Inflation time: 30 sec. Minimum lumen area:  3.3 mm. Stent strut is well apposed. Post-stent angioplasty was performed using a BALLOON SAPPHIRE [place] 3.25X15. Maximum pressure:  18 atm. Inflation time: 20 sec. Post-Intervention Lesion Assessment The intervention was successful. Pre-interventional TIMI flow is 3. Post-intervention TIMI flow is 3. Treated lesion length:  28 mm. No complications occurred at this lesion. There is a 0% residual stenosis post intervention.      MONITORS  LONG TERM MONITOR (3-14 DAYS) 09/01/2018  Narrative A Zio monitor was performed 7 days 6 hours beginning 09/01/2018 to assess syncope.  The rhythm throughout is sinus with minimum average and maximum heart rates of 40, 79, and 136 bpm.  The minimum rate was sinus bradycardia.  Ventricular ectopy was occasional 1.8% burden of PVCs with rare couplet and triplet.  Supraventricular ectopy was frequent with frequent APCs and rare couplets and triplets.  There were no episodes of atrial fibrillation or flutter.  There were no episodes of second or third-degree A-V block or sinus node exit block and no episodes of pauses greater than 3 seconds.  There were no symptomatic or triggered events   Conclusion, frequent supraventricular ectopy occasional ventricular ectopy no significant bradycardia to account for syncope.  CT SCANS  CT CORONARY MORPH W/CTA COR W/SCORE 01/04/2020  Addendum  01/04/2020  9:41 PM ADDENDUM REPORT: 01/04/2020 21:38  CLINICAL DATA:  83 year old male with hypertensin and bilateral carotid disease.  EXAM: Cardiac/Coronary  CT  TECHNIQUE: The patient was scanned on a [employer].  FINDINGS: A 120 kV prospective scan was triggered in the descending thoracic aorta at 111  HU's. Axial non-contrast 3 mm slices were carried out through the heart. The data set was analyzed on a dedicated work station and scored using the Agatson method. Gantry rotation speed was 250 msecs and collimation was .6 mm. No beta blockade and 0.8 mg of sl NTG was given. The 3D data set was reconstructed in 5% intervals of the 67-82 % of the R-R cycle. Diastolic phases were analyzed on a dedicated work station using MPR, MIP and VRT modes. The patient received 80 cc of contrast.  Aorta: Normal size. There is mild aortic root calcifications. No dissection.  Aortic Valve:  Trileaflet.  There is mild calcifications.  Coronary Arteries:  Normal coronary origin.  Right dominance.  RCA is a large dominant artery that gives rise to PDA and PLVB. There is significant diffuse severe (>70%) calcified plaques through out the RCA. There is a noncalcified lesion right above the bifurcation of the PLVB and PDA which appears to be close to 99% occluded.  Left main is a large artery that gives rise to LAD and LCX arteries.  LAD is a large vessel. The proximal LAD with a severe (>70%) calcified plaque. The Mid to Distal LAD is a long tubular severe (>70%) calcified plaque. There is minimal diffuse calcified plaque in the LAD.  LCX is a non-dominant artery that gives rise to one large OM1 branch. There is a proximal moderate (50-69%) calcified plaque. The mid to distal LCX with severe (>70%) calcified plaque. The distal LCX with moderate (50-69%) calcified plaque.  Other findings:  Normal pulmonary vein drainage into the left Tate.  Normal left atrial appendage  without a thrombus.  Normal size of the pulmonary artery.  Mitral annular calcification.  IMPRESSION: 1. Severe Multi-vessel Coronary artery disease. CADRADS 4A. Recommend cardiac catheterization for revascularization consideration.  2. Coronary calcium score of 3211. This was 26 percentile for age and sex matched control.  2. Normal coronary origin with right dominance.  3. Aortic atherosclerosis.  4. Mild Mitral annular calcification.  Thomasene Ripple, DO   Electronically Signed By: Thomasene Ripple MD On: 01/04/2020 21:38  Narrative EXAM: OVER-READ INTERPRETATION  CT CHEST  The following report is an over-read performed by radiologist Dr. Charlett Nose of [facility] on 01/04/2020. This over-read does not include interpretation of cardiac or coronary anatomy or pathology. The coronary CTA interpretation by the cardiologist is attached.  COMPARISON:  12/05/2019  FINDINGS: Vascular: Heavily calcified aorta. No aneurysm with maximum diameter of the visualized ascending aorta 3.4 cm.  Mediastinum/Nodes: No adenopathy.  Lungs/Pleura: Platelike areas of atelectasis in the right middle lobe. Mild elevation of the right hemidiaphragm. No confluent opacities or effusions.  Upper Abdomen: Imaging into the upper abdomen shows no acute findings.  Musculoskeletal: Chest wall soft tissues are unremarkable. No acute bony abnormality.  IMPRESSION: Heavily calcified aorta.  No aneurysm.  No acute extra cardiac abnormality.  Electronically Signed: By: Charlett Nose M.D. On: 01/04/2020 13:06          EKG Interpretation Date/Time:  Monday July 20 2023 15:57:15 EST Ventricular Rate:  63 PR Interval:  216 QRS Duration:  92 QT Interval:  388 QTC Calculation: 397 R Axis:   -16  Text Interpretation: Sinus rhythm with APCs with 1st degree A-V block Septal infarct , age undetermined Abnormal ECG When compared with ECG of 08-Feb-2020 11:54, Unchanged Confirmed by  Corey Tate ([id number]) on 07/20/2023 4:20:24 PM   Recent Labs: 10/14/2022: BUN 18; Creatinine, Ser 1.19; Hemoglobin 12.9; Platelets 178;  Potassium 3.9; Sodium 135  Recent lipid profile in June 2024 cholesterol 150 LDL 66 non-HDL cholesterol 78  EKG Interpretation Date/Time:  Monday July 20 2023 15:57:15 EST Ventricular Rate:  63 PR Interval:  216 QRS Duration:  92 QT Interval:  388 QTC Calculation: 397 R Axis:   -16  Text Interpretation: Sinus rhythm with APCs with 1st degree A-V block Septal infarct , age undetermined Abnormal ECG When compared with ECG of 08-Feb-2020 11:54, Unchanged Confirmed by Corey Tate ([id number]) on 07/20/2023 4:20:24 PM    Physical Exam:    VS:  BP (!) 108/56   Pulse 63   Ht 5\' 11"  (1.803 m)   Wt 144 lb (65.3 kg)   SpO2 93%   BMI 20.08 kg/m     Wt Readings from Last 3 Encounters:  07/20/23 144 lb (65.3 kg)  12/15/22 146 lb (66.2 kg)  10/20/22 151 lb (68.5 kg)     GEN: COPD appearance well nourished, well developed in no acute distress HEENT: Normal NECK: No JVD; No carotid bruits LYMPHATICS: No lymphadenopathy CARDIAC: Distant heart sounds RRR, no murmurs, rubs, gallops RESPIRATORY: Hyperinflated prolonged expiration he has diffuse rhonchi ABDOMEN: Soft, non-tender, non-distended MUSCULOSKELETAL:  No edema; No deformity  SKIN: Warm and dry NEUROLOGIC:  Alert and oriented x 3 PSYCHIATRIC:  Normal affect    Signed, Corey Herrlich, MD  07/20/2023 4:21 PM    [facility]

## 2023-07-20 ENCOUNTER — Encounter: Payer: Self-pay | Admitting: Cardiology

## 2023-07-20 ENCOUNTER — Ambulatory Visit: Payer: Medicare Other | Attending: Cardiology | Admitting: Cardiology

## 2023-07-20 VITALS — BP 108/56 | HR 63 | Ht 71.0 in | Wt 144.0 lb

## 2023-07-20 DIAGNOSIS — I25119 Atherosclerotic heart disease of native coronary artery with unspecified angina pectoris: Secondary | ICD-10-CM | POA: Diagnosis not present

## 2023-07-20 DIAGNOSIS — I129 Hypertensive chronic kidney disease with stage 1 through stage 4 chronic kidney disease, or unspecified chronic kidney disease: Secondary | ICD-10-CM | POA: Diagnosis not present

## 2023-07-20 DIAGNOSIS — J449 Chronic obstructive pulmonary disease, unspecified: Secondary | ICD-10-CM | POA: Diagnosis not present

## 2023-07-20 DIAGNOSIS — I7142 Juxtarenal abdominal aortic aneurysm, without rupture: Secondary | ICD-10-CM

## 2023-07-20 DIAGNOSIS — N182 Chronic kidney disease, stage 2 (mild): Secondary | ICD-10-CM | POA: Diagnosis present

## 2023-07-20 DIAGNOSIS — E785 Hyperlipidemia, unspecified: Secondary | ICD-10-CM | POA: Diagnosis present

## 2023-07-20 DIAGNOSIS — C3411 Malignant neoplasm of upper lobe, right bronchus or lung: Secondary | ICD-10-CM | POA: Diagnosis present

## 2023-07-20 DIAGNOSIS — I6523 Occlusion and stenosis of bilateral carotid arteries: Secondary | ICD-10-CM | POA: Diagnosis present

## 2023-07-20 NOTE — Patient Instructions (Signed)
Medication Instructions:  Your physician recommends that you continue on your current medications as directed. Please refer to the Current Medication list given to you today.  *If you need a refill on your cardiac medications before your next appointment, please call your pharmacy*   Lab Work: None If you have labs (blood work) drawn today and your tests are completely normal, you will receive your results only by: MyChart Message (if you have MyChart) OR A paper copy in the mail If you have any lab test that is abnormal or we need to change your treatment, we will call you to review the results.   Testing/Procedures: None   Follow-Up: At [facility], you and your health needs are our priority.  As part of our continuing mission to provide you with exceptional heart care, we have created designated Provider Care Teams.  These Care Teams include your primary Cardiologist (physician) and Advanced Practice Providers (APPs -  Physician Assistants and Nurse Practitioners) who all work together to provide you with the care you need, when you need it.  We recommend signing up for the patient portal called "MyChart".  Sign up information is provided on this After Visit Summary.  MyChart is used to connect with patients for Virtual Visits (Telemedicine).  Patients are able to view lab/test results, encounter notes, upcoming appointments, etc.  Non-urgent messages can be sent to your provider as well.   To learn more about what you can do with MyChart, go to [email or web address].    Your next appointment:   1 year(s)  Provider:   Norman Herrlich, MD    Other Instructions None

## 2023-07-28 NOTE — Progress Notes (Signed)
 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  Uses 02 at home at night.  Appetite is fair.  Oxygen  requirement: continuous.    OBJECTIVE: BP 124/64 (BP Location: Right arm, Patient Position: Sitting)   Pulse 86   Temp 97.9 F (36.6 C) (Oral)   Resp 20   Wt 65.3 kg (144 lb)   SpO2 93%   BMI 20.66 kg/m ;  ; ECOG Performance Status: 1 - Restricted in physically strenuous activity but ambulatory and able to carry out work of a light or sedentary nature, e.g., light house work, office work  [employer] Readings from Last 3 Encounters:  07/28/23 65.3 kg (144 lb)  06/29/23 65.3 kg (144 lb)  05/28/23 67.1 kg (148 lb)    No results found for: WBC, HGB, HCT, PLT, NEUTROABS   Gen: No apparent distress.  Pulm: Breath sounds: clear.  CV: Heart sounds: regular.  Skin: No erythema, no desquamation.  Ext: No lymphedema.    ASSESSMENT:   Tolerating RT  PLAN:   Radiation therapy to continue as planned.  Chart check completed.  Portal imaging/IGRT reviewed, as appropriate.  Medications reviewed.  Pain: assessed above, no changes recommended.  Nutrition: no changes.    Document Interface ID: #[id number]#

## 2024-04-01 ENCOUNTER — Inpatient Hospital Stay
Admission: RE | Admit: 2024-04-01 | Discharge: 2024-04-01 | Disposition: A | Discharge status: Home / Self Care | Payer: Self-pay | Source: Ambulatory Visit | Attending: Vascular Surgery | Admitting: Vascular Surgery

## 2024-04-01 ENCOUNTER — Other Ambulatory Visit (HOSPITAL_COMMUNITY): Payer: Self-pay | Admitting: Vascular Surgery

## 2024-04-01 DIAGNOSIS — I739 Peripheral vascular disease, unspecified: Secondary | ICD-10-CM

## 2024-04-01 LAB — LAB REPORT - SCANNED
A1c: 6
EGFR: 79.3

## 2024-04-13 ENCOUNTER — Encounter: Admitting: Vascular Surgery

## 2024-04-15 ENCOUNTER — Other Ambulatory Visit: Payer: Self-pay

## 2024-04-15 ENCOUNTER — Encounter (HOSPITAL_COMMUNITY): Payer: Self-pay

## 2024-04-15 ENCOUNTER — Inpatient Hospital Stay (HOSPITAL_COMMUNITY)
Admission: EM | Admit: 2024-04-15 | Discharge: 2024-05-03 | DRG: 451 | Disposition: A | Discharge status: Home Health | Attending: Family Medicine | Admitting: Family Medicine

## 2024-04-15 ENCOUNTER — Emergency Department (HOSPITAL_COMMUNITY)

## 2024-04-15 DIAGNOSIS — M5126 Other intervertebral disc displacement, lumbar region: Secondary | ICD-10-CM | POA: Diagnosis present

## 2024-04-15 DIAGNOSIS — E43 Unspecified severe protein-calorie malnutrition: Secondary | ICD-10-CM | POA: Insufficient documentation

## 2024-04-15 DIAGNOSIS — Z833 Family history of diabetes mellitus: Secondary | ICD-10-CM

## 2024-04-15 DIAGNOSIS — Z85828 Personal history of other malignant neoplasm of skin: Secondary | ICD-10-CM

## 2024-04-15 DIAGNOSIS — Z85118 Personal history of other malignant neoplasm of bronchus and lung: Secondary | ICD-10-CM

## 2024-04-15 DIAGNOSIS — I70203 Unspecified atherosclerosis of native arteries of extremities, bilateral legs: Secondary | ICD-10-CM | POA: Diagnosis present

## 2024-04-15 DIAGNOSIS — Z981 Arthrodesis status: Secondary | ICD-10-CM

## 2024-04-15 DIAGNOSIS — Y838 Other surgical procedures as the cause of abnormal reaction of the patient, or of later complication, without mention of misadventure at the time of the procedure: Secondary | ICD-10-CM | POA: Diagnosis present

## 2024-04-15 DIAGNOSIS — F17210 Nicotine dependence, cigarettes, uncomplicated: Secondary | ICD-10-CM | POA: Diagnosis present

## 2024-04-15 DIAGNOSIS — G4733 Obstructive sleep apnea (adult) (pediatric): Secondary | ICD-10-CM | POA: Diagnosis present

## 2024-04-15 DIAGNOSIS — Z7982 Long term (current) use of aspirin: Secondary | ICD-10-CM

## 2024-04-15 DIAGNOSIS — I959 Hypotension, unspecified: Secondary | ICD-10-CM | POA: Diagnosis not present

## 2024-04-15 DIAGNOSIS — M5440 Lumbago with sciatica, unspecified side: Secondary | ICD-10-CM | POA: Diagnosis not present

## 2024-04-15 DIAGNOSIS — M545 Low back pain, unspecified: Secondary | ICD-10-CM | POA: Diagnosis not present

## 2024-04-15 DIAGNOSIS — I25119 Atherosclerotic heart disease of native coronary artery with unspecified angina pectoris: Secondary | ICD-10-CM | POA: Diagnosis present

## 2024-04-15 DIAGNOSIS — F419 Anxiety disorder, unspecified: Secondary | ICD-10-CM | POA: Diagnosis present

## 2024-04-15 DIAGNOSIS — I714 Abdominal aortic aneurysm, without rupture, unspecified: Secondary | ICD-10-CM | POA: Diagnosis present

## 2024-04-15 DIAGNOSIS — I129 Hypertensive chronic kidney disease with stage 1 through stage 4 chronic kidney disease, or unspecified chronic kidney disease: Secondary | ICD-10-CM | POA: Diagnosis present

## 2024-04-15 DIAGNOSIS — I10 Essential (primary) hypertension: Secondary | ICD-10-CM | POA: Diagnosis present

## 2024-04-15 DIAGNOSIS — Z79899 Other long term (current) drug therapy: Secondary | ICD-10-CM

## 2024-04-15 DIAGNOSIS — Z7901 Long term (current) use of anticoagulants: Secondary | ICD-10-CM

## 2024-04-15 DIAGNOSIS — J449 Chronic obstructive pulmonary disease, unspecified: Secondary | ICD-10-CM | POA: Diagnosis present

## 2024-04-15 DIAGNOSIS — Z7951 Long term (current) use of inhaled steroids: Secondary | ICD-10-CM

## 2024-04-15 DIAGNOSIS — J440 Chronic obstructive pulmonary disease with acute lower respiratory infection: Secondary | ICD-10-CM | POA: Diagnosis present

## 2024-04-15 DIAGNOSIS — Z7902 Long term (current) use of antithrombotics/antiplatelets: Secondary | ICD-10-CM

## 2024-04-15 DIAGNOSIS — I480 Paroxysmal atrial fibrillation: Secondary | ICD-10-CM | POA: Diagnosis present

## 2024-04-15 DIAGNOSIS — I739 Peripheral vascular disease, unspecified: Secondary | ICD-10-CM | POA: Diagnosis present

## 2024-04-15 DIAGNOSIS — M549 Dorsalgia, unspecified: Secondary | ICD-10-CM | POA: Diagnosis present

## 2024-04-15 DIAGNOSIS — Z8701 Personal history of pneumonia (recurrent): Secondary | ICD-10-CM

## 2024-04-15 DIAGNOSIS — Z881 Allergy status to other antibiotic agents status: Secondary | ICD-10-CM

## 2024-04-15 DIAGNOSIS — M48061 Spinal stenosis, lumbar region without neurogenic claudication: Principal | ICD-10-CM | POA: Diagnosis present

## 2024-04-15 DIAGNOSIS — I482 Chronic atrial fibrillation, unspecified: Secondary | ICD-10-CM | POA: Diagnosis present

## 2024-04-15 DIAGNOSIS — Z96641 Presence of right artificial hip joint: Secondary | ICD-10-CM | POA: Diagnosis present

## 2024-04-15 DIAGNOSIS — J189 Pneumonia, unspecified organism: Principal | ICD-10-CM

## 2024-04-15 DIAGNOSIS — E440 Moderate protein-calorie malnutrition: Secondary | ICD-10-CM | POA: Diagnosis present

## 2024-04-15 DIAGNOSIS — K219 Gastro-esophageal reflux disease without esophagitis: Secondary | ICD-10-CM | POA: Diagnosis present

## 2024-04-15 DIAGNOSIS — J9611 Chronic respiratory failure with hypoxia: Secondary | ICD-10-CM | POA: Diagnosis present

## 2024-04-15 DIAGNOSIS — Z955 Presence of coronary angioplasty implant and graft: Secondary | ICD-10-CM

## 2024-04-15 DIAGNOSIS — G8929 Other chronic pain: Secondary | ICD-10-CM | POA: Diagnosis present

## 2024-04-15 DIAGNOSIS — Z9981 Dependence on supplemental oxygen: Secondary | ICD-10-CM

## 2024-04-15 DIAGNOSIS — M5116 Intervertebral disc disorders with radiculopathy, lumbar region: Secondary | ICD-10-CM | POA: Diagnosis present

## 2024-04-15 DIAGNOSIS — C3411 Malignant neoplasm of upper lobe, right bronchus or lung: Secondary | ICD-10-CM | POA: Diagnosis present

## 2024-04-15 DIAGNOSIS — T84226A Displacement of internal fixation device of vertebrae, initial encounter: Secondary | ICD-10-CM | POA: Diagnosis present

## 2024-04-15 DIAGNOSIS — Z681 Body mass index (BMI) 19 or less, adult: Secondary | ICD-10-CM

## 2024-04-15 DIAGNOSIS — M960 Pseudarthrosis after fusion or arthrodesis: Secondary | ICD-10-CM | POA: Diagnosis present

## 2024-04-15 DIAGNOSIS — Z1152 Encounter for screening for COVID-19: Secondary | ICD-10-CM

## 2024-04-15 DIAGNOSIS — M62838 Other muscle spasm: Secondary | ICD-10-CM

## 2024-04-15 DIAGNOSIS — Z888 Allergy status to other drugs, medicaments and biological substances status: Secondary | ICD-10-CM

## 2024-04-15 DIAGNOSIS — N182 Chronic kidney disease, stage 2 (mild): Secondary | ICD-10-CM | POA: Diagnosis present

## 2024-04-15 DIAGNOSIS — E785 Hyperlipidemia, unspecified: Secondary | ICD-10-CM | POA: Diagnosis present

## 2024-04-15 LAB — CBC WITH DIFFERENTIAL/PLATELET
Abs Immature Granulocytes: 0.02 K/uL (ref 0.00–0.07)
Basophils Absolute: 0.1 K/uL (ref 0.0–0.1)
Basophils Relative: 1 %
Eosinophils Absolute: 0.1 K/uL (ref 0.0–0.5)
Eosinophils Relative: 2 %
HCT: 35.7 % — ABNORMAL LOW (ref 39.0–52.0)
Hemoglobin: 11.9 g/dL — ABNORMAL LOW (ref 13.0–17.0)
Immature Granulocytes: 0 %
Lymphocytes Relative: 15 %
Lymphs Abs: 1.3 K/uL (ref 0.7–4.0)
MCH: 32.6 pg (ref 26.0–34.0)
MCHC: 33.3 g/dL (ref 30.0–36.0)
MCV: 97.8 fL (ref 80.0–100.0)
Monocytes Absolute: 1 K/uL (ref 0.1–1.0)
Monocytes Relative: 12 %
Neutro Abs: 6.2 K/uL (ref 1.7–7.7)
Neutrophils Relative %: 70 %
Platelets: 197 K/uL (ref 150–400)
RBC: 3.65 MIL/uL — ABNORMAL LOW (ref 4.22–5.81)
RDW: 12.6 % (ref 11.5–15.5)
WBC: 8.7 K/uL (ref 4.0–10.5)
nRBC: 0 % (ref 0.0–0.2)

## 2024-04-15 LAB — COMPREHENSIVE METABOLIC PANEL WITH GFR
ALT: 12 U/L (ref 0–44)
AST: 20 U/L (ref 15–41)
Albumin: 3.3 g/dL — ABNORMAL LOW (ref 3.5–5.0)
Alkaline Phosphatase: 47 U/L (ref 38–126)
Anion gap: 8 (ref 5–15)
BUN: 15 mg/dL (ref 8–23)
CO2: 28 mmol/L (ref 22–32)
Calcium: 9.2 mg/dL (ref 8.9–10.3)
Chloride: 101 mmol/L (ref 98–111)
Creatinine, Ser: 1 mg/dL (ref 0.61–1.24)
GFR, Estimated: >60 mL/min (ref >60)
Glucose, Bld: 102 mg/dL — ABNORMAL HIGH (ref 70–99)
Potassium: 4.1 mmol/L (ref 3.5–5.1)
Sodium: 137 mmol/L (ref 135–145)
Total Bilirubin: 0.5 mg/dL (ref 0.0–1.2)
Total Protein: 6.4 g/dL — ABNORMAL LOW (ref 6.5–8.1)

## 2024-04-15 LAB — URINALYSIS, W/ REFLEX TO CULTURE (INFECTION SUSPECTED)
Bacteria, UA: NONE SEEN
Bilirubin Urine: NEGATIVE
Glucose, UA: NEGATIVE mg/dL
Hgb urine dipstick: NEGATIVE
Ketones, ur: NEGATIVE mg/dL
Leukocytes,Ua: NEGATIVE
Nitrite: NEGATIVE
Protein, ur: NEGATIVE mg/dL
Specific Gravity, Urine: 1.018 (ref 1.005–1.030)
pH: 6 (ref 5.0–8.0)

## 2024-04-15 LAB — LIPASE, BLOOD: Lipase: 21 U/L (ref 11–51)

## 2024-04-15 LAB — TROPONIN I (HIGH SENSITIVITY)
Troponin I (High Sensitivity): 17 ng/L (ref <18)
Troponin I (High Sensitivity): 15 ng/L (ref <18)

## 2024-04-15 LAB — RESP PANEL BY RT-PCR (RSV, FLU A&B, COVID)  RVPGX2
Influenza A by PCR: NEGATIVE
Influenza B by PCR: NEGATIVE
Resp Syncytial Virus by PCR: NEGATIVE
SARS Coronavirus 2 by RT PCR: NEGATIVE

## 2024-04-15 LAB — I-STAT CG4 LACTIC ACID, ED
Lactic Acid, Venous: 0.7 mmol/L (ref 0.5–1.9)
Lactic Acid, Venous: 0.6 mmol/L (ref 0.5–1.9)

## 2024-04-15 MED ORDER — SODIUM CHLORIDE 0.9 % IV SOLN
1.0000 g | Freq: Once | INTRAVENOUS | Status: AC
  Administered 2024-04-15: 1 g via INTRAVENOUS
  Filled 2024-04-15: qty 10

## 2024-04-15 MED ORDER — IOHEXOL 350 MG/ML SOLN
75.0000 mL | Freq: Once | INTRAVENOUS | Status: AC | PRN
  Administered 2024-04-15: 75 mL via INTRAVENOUS

## 2024-04-15 MED ORDER — SODIUM CHLORIDE 0.9 % IV SOLN
500.0000 mg | Freq: Once | INTRAVENOUS | Status: AC
  Administered 2024-04-15: 500 mg via INTRAVENOUS
  Filled 2024-04-15: qty 5

## 2024-04-15 MED ORDER — FENTANYL CITRATE (PF) 50 MCG/ML IJ SOSY
50.0000 ug | PREFILLED_SYRINGE | Freq: Once | INTRAMUSCULAR | Status: AC
  Administered 2024-04-15: 50 ug via INTRAVENOUS
  Filled 2024-04-15: qty 1

## 2024-04-15 MED ORDER — MORPHINE SULFATE (PF) 4 MG/ML IV SOLN
4.0000 mg | Freq: Once | INTRAVENOUS | Status: AC
  Administered 2024-04-15: 4 mg via INTRAVENOUS
  Filled 2024-04-15: qty 1

## 2024-04-15 NOTE — ED Provider Notes (Signed)
 [facility] AT [facility] Provider Note   CSN: [id number] Arrival date & time: 04/15/24  1441     Patient presents with: Back Pain   Corey Tate is a 83 y.o. male.   The history is provided by the patient, a relative and medical records. No language interpreter was used.  Back Pain Location:  Thoracic spine and lumbar spine Quality:  Stabbing, shooting and aching Radiates to:  R posterior upper leg and L posterior upper leg Pain severity:  Severe Pain is:  Unable to specify Onset quality:  Gradual Duration: 1.5 weeks. Timing:  Constant Progression:  Unchanged Chronicity:  Recurrent Relieved by:  Bed rest and narcotics Worsened by:  Bending and coughing Ineffective treatments:  None tried Associated symptoms: abdominal pain   Associated symptoms: no abdominal swelling, no bladder incontinence, no bowel incontinence, no chest pain, no dysuria, no fever, no headaches, no numbness, no perianal numbness, no tingling, no weakness and no weight loss   Risk factors: hx of cancer        Prior to Admission medications   Medication Sig Start Date End Date Taking? Authorizing Provider  acetaminophen  (TYLENOL ) 325 MG tablet Take 650 mg by mouth 2 (two) times daily as needed for moderate pain, fever or headache.    [provider]  amLODipine  (NORVASC ) 5 MG tablet Take 1 tablet (5 mg total) by mouth every other day. 12/15/22   Monetta Redell PARAS, MD  aspirin  EC 81 MG tablet Take 81 mg by mouth daily.    [provider]  BREZTRI AEROSPHERE 160-9-4.8 MCG/ACT AERO Inhale 2 puffs into the lungs 2 (two) times daily. 09/29/22   [provider]  clopidogrel  (PLAVIX ) 75 MG tablet Take 1 tablet (75 mg total) by mouth daily. 03/07/21   Monetta Redell PARAS, MD  docusate sodium  (COLACE) 100 MG capsule Take 100 mg by mouth daily as needed (constipation).    [provider]  doxycycline (VIBRAMYCIN) 100 MG capsule Take 100 mg by mouth every  evening. 06/23/23   [provider]  ezetimibe  (ZETIA ) 10 MG tablet Take 10 mg by mouth daily.    [provider]  gabapentin  (NEURONTIN ) 300 MG capsule Take 300 mg by mouth 2 (two) times daily. 11/27/22   [provider]  ibuprofen  (ADVIL ) 200 MG tablet Take 200-400 mg by mouth daily as needed for headache or moderate pain.    [provider]  Ibuprofen -diphenhydrAMINE  Cit (ADVIL  PM PO) Take 1 tablet by mouth at bedtime as needed (sleep).    [provider]  lansoprazole (PREVACID) 15 MG capsule Take 15 mg by mouth daily.    [provider]  methocarbamol  (ROBAXIN ) 500 MG tablet Take 1 tablet (500 mg total) by mouth every 6 (six) hours as needed for muscle spasms. 10/21/22   Joshua Alm Hamilton, MD  nitroGLYCERIN  (NITROSTAT ) 0.4 MG SL tablet Place 0.4 mg under the tongue every 5 (five) minutes as needed for chest pain.    [provider]  Omega-3 Fatty Acids (FISH OIL) 1000 MG CAPS Take 1,000 mg by mouth daily.    [provider]  oxyCODONE  (OXY IR/ROXICODONE ) 5 MG immediate release tablet Take 1 tablet (5 mg total) by mouth every 4 (four) hours as needed for moderate pain ((score 4 to 6)). Patient not taking: Reported on 07/20/2023 10/21/22   Joshua Alm Hamilton, MD  OXYGEN  Inhale 2 L into the lungs at bedtime.    [provider]  pantoprazole  (  PROTONIX ) 40 MG tablet Take 1 tablet by mouth daily. 04/21/23   [provider]  pravastatin  (PRAVACHOL ) 40 MG tablet Take 1 tablet (40 mg total) by mouth daily. 03/07/21   Monetta Redell PARAS, MD  Vitamin D-Vitamin K (K2 PLUS D3 PO) Take 1 tablet by mouth daily.    [provider]    Allergies: Ferra-caps [iron] and Streptomycin    Review of Systems  Constitutional:  Positive for fatigue. Negative for chills, diaphoresis, fever and weight loss.  HENT:  Negative for congestion.   Respiratory:  Negative for cough, chest tightness, shortness of breath and wheezing.    Cardiovascular:  Negative for chest pain, palpitations and leg swelling.  Gastrointestinal:  Positive for abdominal pain. Negative for bowel incontinence, constipation, diarrhea, nausea and vomiting.  Genitourinary:  Negative for bladder incontinence, dysuria, flank pain and frequency.  Musculoskeletal:  Positive for back pain. Negative for neck pain and neck stiffness.  Skin:  Negative for rash and wound.  Neurological:  Negative for tingling, seizures, weakness, light-headedness, numbness and headaches.  Psychiatric/Behavioral:  Negative for agitation and confusion.   All other systems reviewed and are negative.   Updated Vital Signs BP (!) 126/99   Pulse 81   Temp 98.7 F (37.1 C)   Resp 18   SpO2 92%   Physical Exam Vitals and nursing note reviewed.  Constitutional:      General: He is not in acute distress.    Appearance: He is well-developed. He is not ill-appearing, toxic-appearing or diaphoretic.  HENT:     Head: Normocephalic and atraumatic.     Nose: No congestion or rhinorrhea.     Mouth/Throat:     Mouth: Mucous membranes are dry.     Pharynx: No oropharyngeal exudate or posterior oropharyngeal erythema.  Eyes:     Extraocular Movements: Extraocular movements intact.     Conjunctiva/sclera: Conjunctivae normal.     Pupils: Pupils are equal, round, and reactive to light.  Cardiovascular:     Rate and Rhythm: Normal rate and regular rhythm.     Heart sounds: No murmur heard. Pulmonary:     Effort: Pulmonary effort is normal. No respiratory distress.     Breath sounds: Rhonchi present. No wheezing or rales.  Chest:     Chest wall: No tenderness.  Abdominal:     Palpations: Abdomen is soft.     Tenderness: There is abdominal tenderness.  Musculoskeletal:        General: Tenderness present. No swelling.     Cervical back: Neck supple.  Skin:    General: Skin is warm and dry.     Capillary Refill: Capillary refill takes less than 2 seconds.     Findings: No  erythema or rash.  Neurological:     General: No focal deficit present.     Mental Status: He is alert.     Sensory: No sensory deficit.     Motor: No weakness.  Psychiatric:        Mood and Affect: Mood normal.     (all labs ordered are listed, but only abnormal results are displayed) Labs Reviewed  CBC WITH DIFFERENTIAL/PLATELET - Abnormal; Notable for the following components:      Result Value   RBC 3.65 (*)    Hemoglobin 11.9 (*)    HCT 35.7 (*)    All other components within normal limits  COMPREHENSIVE METABOLIC PANEL WITH GFR - Abnormal; Notable for the following components:   Glucose, Bld 102 (*)  Total Protein 6.4 (*)    Albumin  3.3 (*)    All other components within normal limits  RESP PANEL BY RT-PCR (RSV, FLU A&B, COVID)  [id number]  URINALYSIS, W/ REFLEX TO CULTURE (INFECTION SUSPECTED)  LIPASE, BLOOD  I-STAT CG4 LACTIC ACID, ED  I-STAT CG4 LACTIC ACID, ED  TROPONIN I (HIGH SENSITIVITY)  TROPONIN I (HIGH SENSITIVITY)    EKG: EKG Interpretation Date/Time:  Friday April 15 2024 16:38:13 EDT Ventricular Rate:  59 PR Interval:  225 QRS Duration:  96 QT Interval:  410 QTC Calculation: 407 R Axis:   30  Text Interpretation: Sinus rhythm Prolonged PR interval Anteroseptal infarct, old when compared to prior, similar appearance No STEMI Confirmed by Ginger Barefoot ([id number]) on 04/15/2024 5:15:55 PM  Radiology: CT L-SPINE NO CHARGE Result Date: 04/15/2024 CLINICAL DATA:  Flank and back pain radiating to the legs EXAM: CT Thoracic and Lumbar spine without contrast TECHNIQUE: Multiplanar CT images of the thoracic and lumbar spine were reconstructed from contemporary CT of the Chest, Abdomen, and Pelvis. RADIATION DOSE REDUCTION: This exam was performed according to the departmental dose-optimization program which includes automated exposure control, adjustment of the mA and/or kV according to patient size and/or use of iterative reconstruction technique. CONTRAST:   None or No additional COMPARISON:  Chest CT 02/03/2024, MRI 02/12/2023 FINDINGS: CT THORACIC SPINE FINDINGS Alignment: Normal. Vertebrae: No acute fracture or focal pathologic process. Paraspinal and other soft tissues: No acute finding Disc levels: Partial ankylosis T5-T6 disc space. Multilevel degenerative osteophyte. No high-grade canal stenosis. CT LUMBAR SPINE FINDINGS Segmentation: 5 lumbar type vertebrae. Alignment: Grade 1 anterolisthesis L4 on L5. Vertebrae: No acute fracture or focal pathologic process. Paraspinal and other soft tissues: No acute finding Disc levels: Posterior spinal instrumentation L3 through L5. Lucency about the pedicular screws at L5. At L1-L2, disc bulge, moderate severe facet arthropathy and moderate severe canal stenosis. No high-grade foraminal narrowing. At L2-L3, disc space narrowing. Diffuse disc bulge. Moderate severe facet degenerative changes. At least mild canal stenosis. Posterior decompression changes. At L3-L4, posterior decompression changes. Limited by hardware artifact. Disc bulge and advanced facet degenerative changes. Mild canal stenosis. At L4-L5, posterior decompression changes. Artifact from hardware limits assessment of the canal. At L5-S1, disc space narrowing. Moderate facet degenerative changes. No high-grade canal stenosis. Moderate severe bilateral foraminal narrowing. IMPRESSION: 1. No CT evidence for acute osseous abnormality of the thoracic or lumbar spine. 2. Posterior spinal instrumentation L3 through L5. Lucency about the pedicular screws at L5 correlate for loosening. 3. Multilevel degenerative changes of the lumbar spine as described above. Electronically Signed   By: Luke Bun M.D.   On: 04/15/2024 20:25   CT T-SPINE NO CHARGE Result Date: 04/15/2024 CLINICAL DATA:  Flank and back pain radiating to the legs EXAM: CT Thoracic and Lumbar spine without contrast TECHNIQUE: Multiplanar CT images of the thoracic and lumbar spine were  reconstructed from contemporary CT of the Chest, Abdomen, and Pelvis. RADIATION DOSE REDUCTION: This exam was performed according to the departmental dose-optimization program which includes automated exposure control, adjustment of the mA and/or kV according to patient size and/or use of iterative reconstruction technique. CONTRAST:  None or No additional COMPARISON:  Chest CT 02/03/2024, MRI 02/12/2023 FINDINGS: CT THORACIC SPINE FINDINGS Alignment: Normal. Vertebrae: No acute fracture or focal pathologic process. Paraspinal and other soft tissues: No acute finding Disc levels: Partial ankylosis T5-T6 disc space. Multilevel degenerative osteophyte. No high-grade canal stenosis. CT LUMBAR SPINE FINDINGS Segmentation: 5 lumbar type vertebrae. Alignment: Grade  1 anterolisthesis L4 on L5. Vertebrae: No acute fracture or focal pathologic process. Paraspinal and other soft tissues: No acute finding Disc levels: Posterior spinal instrumentation L3 through L5. Lucency about the pedicular screws at L5. At L1-L2, disc bulge, moderate severe facet arthropathy and moderate severe canal stenosis. No high-grade foraminal narrowing. At L2-L3, disc space narrowing. Diffuse disc bulge. Moderate severe facet degenerative changes. At least mild canal stenosis. Posterior decompression changes. At L3-L4, posterior decompression changes. Limited by hardware artifact. Disc bulge and advanced facet degenerative changes. Mild canal stenosis. At L4-L5, posterior decompression changes. Artifact from hardware limits assessment of the canal. At L5-S1, disc space narrowing. Moderate facet degenerative changes. No high-grade canal stenosis. Moderate severe bilateral foraminal narrowing. IMPRESSION: 1. No CT evidence for acute osseous abnormality of the thoracic or lumbar spine. 2. Posterior spinal instrumentation L3 through L5. Lucency about the pedicular screws at L5 correlate for loosening. 3. Multilevel degenerative changes of the lumbar  spine as described above. Electronically Signed   By: Luke Bun M.D.   On: 04/15/2024 20:25   CT Angio Chest/Abd/Pel for Dissection W and/or Wo Contrast Result Date: 04/15/2024 CLINICAL DATA:  Knife-like back pain history of known AAA EXAM: CT ANGIOGRAPHY CHEST, ABDOMEN AND PELVIS TECHNIQUE: Non-contrast CT of the chest was initially obtained. Multidetector CT imaging through the chest, abdomen and pelvis was performed using the standard protocol during bolus administration of intravenous contrast. Multiplanar reconstructed images and MIPs were obtained and reviewed to evaluate the vascular anatomy. RADIATION DOSE REDUCTION: This exam was performed according to the departmental dose-optimization program which includes automated exposure control, adjustment of the mA and/or kV according to patient size and/or use of iterative reconstruction technique. CONTRAST:  75mL OMNIPAQUE  IOHEXOL  350 MG/ML SOLN COMPARISON:  Chest CT 02/03/2024, PET CT 08/27/2022, CT angiography 01/26/2024 FINDINGS: CTA CHEST FINDINGS Cardiovascular: Non contrasted images of the chest demonstrate no acute intramural hematoma. Advanced aortic atherosclerosis. No aneurysm or dissection. Multi vessel coronary vascular calcification. Normal cardiac size. No pericardial effusion. Mediastinum/Nodes: Patent trachea. No thyroid mass. No suspicious lymph nodes. Esophagus within normal limits. Lungs/Pleura: Emphysema. Interim development of heterogeneous right lower lobe airspace disease and ground-glass density. Inferior right upper lobe irregular pulmonary nodule measuring about 8 x 5 mm on series 7, image 95, previously 9 x 5 mm. No new pulmonary nodule, pleural effusion, or pneumothorax. Debris within the right lower lobe bronchi. Bronchial wall thickening. Fibrosis and scarring medial left base Musculoskeletal: Sternum appears intact. No acute osseous abnormality. See separately dictated spine CT Review of the MIP images confirms the above  findings. CTA ABDOMEN AND PELVIS FINDINGS VASCULAR Aorta: Atherosclerosis. No dissection. No occlusive disease. Infrarenal abdominal aortic aneurysm measuring 3.9 cm maximum, previously 3.8 cm. Prominent intraluminal thrombus. Negative for retroperitoneal hematoma or surrounding stranding. Celiac: Moderate stenosis at the origin. Diffuse calcific and noncalcific plaque. Distal flow enhancement present. No aneurysm SMA: Heavily calcified at the origin with at least moderate stenosis. Distal flow enhancement present. Renals: 2 right and single left renal arteries. Dominant superior right renal artery and the main left renal artery demonstrate heavy calcific disease with moderate severe stenosis of the proximal arteries. IMA: High-grade stenosis at the origin. Distal flow enhancement present Inflow: Negative for aneurysm or dissection. Advanced atherosclerosis. Severe stenosis that the origin of the right internal iliac artery with probable short segment chronic occlusion. Severe diffuse disease of the external iliac vessels with at least moderate stenosis of the distal right external iliac artery. Stenotic appearing bilateral SFA origins. Veins:  Suboptimally assessed Review of the MIP images confirms the above findings. NON-VASCULAR Hepatobiliary: No focal liver abnormality is seen. No gallstones, gallbladder wall thickening, or biliary dilatation. Pancreas: Unremarkable. No pancreatic ductal dilatation or surrounding inflammatory changes. Spleen: Normal in size without focal abnormality. Adrenals/Urinary Tract: Thickened left adrenal gland without mass. Normal right adrenal gland. No hydronephrosis. Nonobstructing small left-sided kidney stone. The bladder is unremarkable Stomach/Bowel: The stomach is nonenlarged. There is no dilated small bowel. No acute bowel wall thickening Lymphatic: No suspicious lymph nodes Reproductive: Negative prostate partially obscured Other: Negative for ascites or free air  Musculoskeletal: Right hip replacement with artifact. Hardware in the spine, see separately dictated spine CT. Review of the MIP images confirms the above findings. IMPRESSION: 1. Negative for acute aortic dissection. 2. Infrarenal abdominal aortic aneurysm measuring 3.9 cm maximum, previously 3.8 cm. Recommend follow-up ultrasound every 3 years. (Ref.: J Vasc Surg. 2018; 67:2-77 and J Am Coll Radiol 2013;10(10):789-794.) 3. Interim development of heterogeneous right lower lobe airspace disease and ground-glass density, suspect for pneumonia, possible aspiration. Debris within the right lower lobe bronchi. 4. Emphysema. Stable irregular right upper lobe pulmonary nodule. Reference chest CT 02/03/2024 5. Advanced aortic atherosclerosis. 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 At least moderate stenosis of the proximal renal arteries. 6. Nonobstructing left kidney stone. Aortic Atherosclerosis (ICD10-I70.0) and Emphysema (ICD10-J43.9). Electronically Signed   By: Luke Bun M.D.   On: 04/15/2024 20:08     Procedures   CRITICAL CARE Performed by: Lonni PARAS Kinzly Pierrelouis Total critical care time: 30 minutes Critical care time was exclusive of separately billable procedures and treating other patients. Critical care was necessary to treat or prevent imminent or life-threatening deterioration. Critical care was time spent personally by me on the following activities: development of treatment plan with patient and/or surrogate as well as nursing, discussions with consultants, evaluation of patient's response to treatment, examination of patient, obtaining history from patient or surrogate, ordering and performing treatments and interventions, ordering and review of laboratory studies, ordering and review of radiographic studies, pulse oximetry and re-evaluation of patient's condition.   Medications Ordered in the ED  azithromycin (ZITHROMAX) 500 mg in sodium chloride  0.9 % 250 mL IVPB  (has no administration in time range)  fentaNYL  (SUBLIMAZE ) injection 50 mcg (50 mcg Intravenous Given 04/15/24 1634)  iohexol  (OMNIPAQUE ) 350 MG/ML injection 75 mL (75 mLs Intravenous Contrast Given 04/15/24 1932)  cefTRIAXone (ROCEPHIN) 1 g in sodium chloride  0.9 % 100 mL IVPB (1 g Intravenous New Bag/Given 04/15/24 2159)  morphine  (PF) 4 MG/ML injection 4 mg (4 mg Intravenous Given 04/15/24 2157)                                    Medical Decision Making Amount and/or Complexity of Data Reviewed Labs: ordered. Radiology: ordered.  Risk Prescription drug management.    Corey Tate is a 83 y.o. male with a past medical history significant for CAD, atrial fibrillation, asthma, GERD, lung cancer, AAA, multiple previous back surgeries, COPD, dyslipidemia, and carotid disease who presents with abdominal pain and severe back pain.  According to patient and family, about a month and a half ago he had a fell at a doctor's office but did not have significant pain at that time.  He reports that over the last week and a half he has had severe pain that is 10 out of 10 in severity.  He reports in his back but also a stabbing knifelike pain in his central and upper abdomen that goes to the back.  He reports the pain does radiate down his legs similar to his sciatic nerve pains from his back in the past.  He reports no urinary symptoms and denies dysuria.  He reports some cough but denies significant shortness of breath.  He otherwise denies constipation, diarrhea, and denies any new trauma.  He was prescribed hydrocodone the other day and that seemed to help slightly but has not been helping enough and now he is have difficulty ambulating due to the pain.  Normally gets around with a rollator but has not been able to walk due to the pain over the last few days.  On exam, lungs clear.  Chest nontender.  Abdomen is tender.  Back is tender.  He has palpable pulses in his legs and has intact sensation and  can lift them.  He did not have a murmur for me.  Intact pulses in extremities.  I did not appreciate significant wheezing but he had some faint rhonchi.  Had discussion with family and I clinically suspect that the patient has had a URI recently as the family reports that multiple for members have had colds and cough.  I suspect that this is because coughing and has exacerbated his chronic back pain and spasm.  However, with the history of AAA with stabbing knifelike pain in his abdomen and to his back that is 10 out of 10, we did agree to get a dissection study to rule out acute problem.  This will also get imaging of his back to look for evolution or worsening problem given his surgeries and his fall a month and a half ago.  Given his lack of numbness or weakness in his legs persistent or loss of bowel or bladder control we will hold on MRI initially.  Will give him some pain medicine.  Will check some labs.  Will check him for COVID/flu/RSV given the cough.  CT scan will look for pneumonia so we will hold on chest x-ray.  Anticipate reassessment after workup to determine disposition.  9:37 PM Imaging returned without evidence of aortic dissection.  With all the findings but what it does show is evidence of pneumonia and debris in the bronchus from possible aspiration.  I suspect this is causing his coughing fits back spasm and worsened back pain limiting his mobility.  Due to his ill appearance and pneumonia on top of his chronic lung disease, will admit for IV antibiotics and pain control as well.  No evidence of new spinal fracture on his reformats of the T and L-spine at this time.  Will call for medical admission.      Final diagnoses:  Pneumonia of right lower lobe due to infectious organism  Acute back pain, unspecified back location, unspecified back pain laterality    Clinical Impression: 1. Pneumonia of right lower lobe due to infectious organism   2. Acute back pain, unspecified  back location, unspecified back pain laterality     Disposition: Admit  This note was prepared with assistance of Dragon voice recognition software. Occasional wrong-word or sound-a-like substitutions may have occurred due to the inherent limitations of voice recognition software.      Raiquan Chandler, Lonni PARAS, MD 04/15/24 2231

## 2024-04-15 NOTE — Hospital Course (Addendum)
 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  About one week PTA, patient's chronic low back pain worsened spontaneously, just woke up with it worse.  Radiating similar to prior sciatica.  Called his Neurosurgeron, Dr. Joshua, who has an appointment set up in 2 weeks, but couldn't offer pain medication over the phone, so the patient came to the ER.    In the ER, UA normal, alk phos normal.  CTA C/A/P showed diffuse atherosclerotic disease but no dissection and no bony change to thoracic or lumbar spine.    Patient required 3 doses of IV opiates and still couldn't stand, so hospitalists asked to observe for pain control, PT eval.

## 2024-04-15 NOTE — ED Notes (Signed)
 Pt standing by bedside using urinal, providing UA at this time.

## 2024-04-15 NOTE — ED Triage Notes (Signed)
 Pt arrived via EMS from home CC chronic back pain.Denies new falls. Took pain meds at home without relief. Pt unable to sit in wheelchair pt rolling around in pain refusing to lay still. VSS.

## 2024-04-16 ENCOUNTER — Encounter (HOSPITAL_COMMUNITY): Payer: Self-pay | Admitting: Family Medicine

## 2024-04-16 DIAGNOSIS — Z7901 Long term (current) use of anticoagulants: Secondary | ICD-10-CM | POA: Diagnosis not present

## 2024-04-16 DIAGNOSIS — Y838 Other surgical procedures as the cause of abnormal reaction of the patient, or of later complication, without mention of misadventure at the time of the procedure: Secondary | ICD-10-CM | POA: Diagnosis present

## 2024-04-16 DIAGNOSIS — I482 Chronic atrial fibrillation, unspecified: Secondary | ICD-10-CM | POA: Diagnosis present

## 2024-04-16 DIAGNOSIS — J9611 Chronic respiratory failure with hypoxia: Secondary | ICD-10-CM | POA: Diagnosis present

## 2024-04-16 DIAGNOSIS — I129 Hypertensive chronic kidney disease with stage 1 through stage 4 chronic kidney disease, or unspecified chronic kidney disease: Secondary | ICD-10-CM | POA: Diagnosis present

## 2024-04-16 DIAGNOSIS — N182 Chronic kidney disease, stage 2 (mild): Secondary | ICD-10-CM | POA: Diagnosis present

## 2024-04-16 DIAGNOSIS — I25119 Atherosclerotic heart disease of native coronary artery with unspecified angina pectoris: Secondary | ICD-10-CM | POA: Diagnosis present

## 2024-04-16 DIAGNOSIS — J440 Chronic obstructive pulmonary disease with acute lower respiratory infection: Secondary | ICD-10-CM | POA: Diagnosis present

## 2024-04-16 DIAGNOSIS — Z681 Body mass index (BMI) 19 or less, adult: Secondary | ICD-10-CM | POA: Diagnosis not present

## 2024-04-16 DIAGNOSIS — M5116 Intervertebral disc disorders with radiculopathy, lumbar region: Secondary | ICD-10-CM | POA: Diagnosis present

## 2024-04-16 DIAGNOSIS — M62838 Other muscle spasm: Secondary | ICD-10-CM | POA: Diagnosis not present

## 2024-04-16 DIAGNOSIS — E785 Hyperlipidemia, unspecified: Secondary | ICD-10-CM | POA: Diagnosis present

## 2024-04-16 DIAGNOSIS — M549 Dorsalgia, unspecified: Secondary | ICD-10-CM | POA: Diagnosis not present

## 2024-04-16 DIAGNOSIS — I739 Peripheral vascular disease, unspecified: Secondary | ICD-10-CM | POA: Diagnosis not present

## 2024-04-16 DIAGNOSIS — C3411 Malignant neoplasm of upper lobe, right bronchus or lung: Secondary | ICD-10-CM | POA: Diagnosis not present

## 2024-04-16 DIAGNOSIS — E440 Moderate protein-calorie malnutrition: Secondary | ICD-10-CM | POA: Diagnosis present

## 2024-04-16 DIAGNOSIS — F17210 Nicotine dependence, cigarettes, uncomplicated: Secondary | ICD-10-CM | POA: Diagnosis present

## 2024-04-16 DIAGNOSIS — M960 Pseudarthrosis after fusion or arthrodesis: Secondary | ICD-10-CM | POA: Diagnosis present

## 2024-04-16 DIAGNOSIS — M48061 Spinal stenosis, lumbar region without neurogenic claudication: Secondary | ICD-10-CM | POA: Diagnosis present

## 2024-04-16 DIAGNOSIS — Z7982 Long term (current) use of aspirin: Secondary | ICD-10-CM | POA: Diagnosis not present

## 2024-04-16 DIAGNOSIS — T84226A Displacement of internal fixation device of vertebrae, initial encounter: Secondary | ICD-10-CM | POA: Diagnosis present

## 2024-04-16 DIAGNOSIS — G8929 Other chronic pain: Secondary | ICD-10-CM | POA: Diagnosis present

## 2024-04-16 DIAGNOSIS — G4733 Obstructive sleep apnea (adult) (pediatric): Secondary | ICD-10-CM | POA: Diagnosis present

## 2024-04-16 DIAGNOSIS — Z1152 Encounter for screening for COVID-19: Secondary | ICD-10-CM | POA: Diagnosis not present

## 2024-04-16 DIAGNOSIS — M545 Low back pain, unspecified: Secondary | ICD-10-CM | POA: Diagnosis present

## 2024-04-16 DIAGNOSIS — Z9981 Dependence on supplemental oxygen: Secondary | ICD-10-CM | POA: Diagnosis not present

## 2024-04-16 DIAGNOSIS — M5126 Other intervertebral disc displacement, lumbar region: Secondary | ICD-10-CM | POA: Diagnosis present

## 2024-04-16 DIAGNOSIS — I2510 Atherosclerotic heart disease of native coronary artery without angina pectoris: Secondary | ICD-10-CM | POA: Diagnosis not present

## 2024-04-16 DIAGNOSIS — I70203 Unspecified atherosclerosis of native arteries of extremities, bilateral legs: Secondary | ICD-10-CM | POA: Diagnosis present

## 2024-04-16 DIAGNOSIS — M5440 Lumbago with sciatica, unspecified side: Secondary | ICD-10-CM | POA: Diagnosis not present

## 2024-04-16 DIAGNOSIS — I480 Paroxysmal atrial fibrillation: Secondary | ICD-10-CM | POA: Diagnosis present

## 2024-04-16 DIAGNOSIS — J189 Pneumonia, unspecified organism: Secondary | ICD-10-CM | POA: Diagnosis not present

## 2024-04-16 DIAGNOSIS — Z7902 Long term (current) use of antithrombotics/antiplatelets: Secondary | ICD-10-CM | POA: Diagnosis not present

## 2024-04-16 LAB — BASIC METABOLIC PANEL WITH GFR
Anion gap: 13 (ref 5–15)
BUN: 12 mg/dL (ref 8–23)
CO2: 28 mmol/L (ref 22–32)
Calcium: 9.2 mg/dL (ref 8.9–10.3)
Chloride: 95 mmol/L — ABNORMAL LOW (ref 98–111)
Creatinine, Ser: 0.9 mg/dL (ref 0.61–1.24)
GFR, Estimated: >60 mL/min (ref >60)
Glucose, Bld: 83 mg/dL (ref 70–99)
Potassium: 3.8 mmol/L (ref 3.5–5.1)
Sodium: 136 mmol/L (ref 135–145)

## 2024-04-16 LAB — CBC
HCT: 36.6 % — ABNORMAL LOW (ref 39.0–52.0)
Hemoglobin: 12.4 g/dL — ABNORMAL LOW (ref 13.0–17.0)
MCH: 32.9 pg (ref 26.0–34.0)
MCHC: 33.9 g/dL (ref 30.0–36.0)
MCV: 97.1 fL (ref 80.0–100.0)
Platelets: 178 K/uL (ref 150–400)
RBC: 3.77 MIL/uL — ABNORMAL LOW (ref 4.22–5.81)
RDW: 12.7 % (ref 11.5–15.5)
WBC: 7.9 K/uL (ref 4.0–10.5)
nRBC: 0 % (ref 0.0–0.2)

## 2024-04-16 MED ORDER — SODIUM CHLORIDE 0.9 % IV SOLN
2.0000 g | INTRAVENOUS | Status: AC
  Administered 2024-04-16 – 2024-04-19 (×4): 2 g via INTRAVENOUS
  Filled 2024-04-16 (×4): qty 20

## 2024-04-16 MED ORDER — KETOROLAC TROMETHAMINE 15 MG/ML IJ SOLN
15.0000 mg | Freq: Four times a day (QID) | INTRAMUSCULAR | Status: AC | PRN
  Administered 2024-04-16 – 2024-04-20 (×6): 15 mg via INTRAVENOUS
  Filled 2024-04-16 (×6): qty 1

## 2024-04-16 MED ORDER — SODIUM CHLORIDE 0.9 % IV SOLN
500.0000 mg | INTRAVENOUS | Status: DC
  Administered 2024-04-16: 500 mg via INTRAVENOUS
  Filled 2024-04-16: qty 5

## 2024-04-16 MED ORDER — OXYCODONE HCL 5 MG PO TABS
5.0000 mg | ORAL_TABLET | ORAL | Status: DC | PRN
  Administered 2024-04-16 – 2024-04-26 (×33): 5 mg via ORAL
  Filled 2024-04-16 (×34): qty 1

## 2024-04-16 MED ORDER — ENOXAPARIN SODIUM 40 MG/0.4ML IJ SOSY
40.0000 mg | PREFILLED_SYRINGE | INTRAMUSCULAR | Status: AC
  Administered 2024-04-16 – 2024-05-01 (×16): 40 mg via SUBCUTANEOUS
  Filled 2024-04-16 (×16): qty 0.4

## 2024-04-16 MED ORDER — METHOCARBAMOL 500 MG PO TABS
500.0000 mg | ORAL_TABLET | Freq: Four times a day (QID) | ORAL | Status: DC | PRN
  Administered 2024-04-16 – 2024-04-25 (×18): 500 mg via ORAL
  Filled 2024-04-16 (×22): qty 1

## 2024-04-16 MED ORDER — EZETIMIBE 10 MG PO TABS
10.0000 mg | ORAL_TABLET | Freq: Every day | ORAL | Status: DC
  Administered 2024-04-16 – 2024-05-03 (×17): 10 mg via ORAL
  Filled 2024-04-16 (×17): qty 1

## 2024-04-16 MED ORDER — AMLODIPINE BESYLATE 5 MG PO TABS
5.0000 mg | ORAL_TABLET | ORAL | Status: DC
Start: 2024-04-16 — End: 2024-04-27
  Administered 2024-04-16 – 2024-04-24 (×5): 5 mg via ORAL
  Filled 2024-04-16 (×6): qty 1

## 2024-04-16 MED ORDER — PANTOPRAZOLE SODIUM 20 MG PO TBEC
20.0000 mg | DELAYED_RELEASE_TABLET | Freq: Every day | ORAL | Status: DC
  Administered 2024-04-16 – 2024-04-28 (×13): 20 mg via ORAL
  Filled 2024-04-16 (×14): qty 1

## 2024-04-16 MED ORDER — ASPIRIN 81 MG PO TBEC
81.0000 mg | DELAYED_RELEASE_TABLET | Freq: Every day | ORAL | Status: DC
  Administered 2024-04-16 – 2024-04-20 (×5): 81 mg via ORAL
  Filled 2024-04-16 (×5): qty 1

## 2024-04-16 MED ORDER — ONDANSETRON HCL 4 MG/2ML IJ SOLN
4.0000 mg | Freq: Four times a day (QID) | INTRAMUSCULAR | Status: DC | PRN
  Administered 2024-04-18 – 2024-04-19 (×2): 4 mg via INTRAVENOUS
  Filled 2024-04-16 (×2): qty 2

## 2024-04-16 MED ORDER — DOCUSATE SODIUM 100 MG PO CAPS
100.0000 mg | ORAL_CAPSULE | Freq: Two times a day (BID) | ORAL | Status: DC
  Administered 2024-04-16 – 2024-05-03 (×30): 100 mg via ORAL
  Filled 2024-04-16 (×31): qty 1

## 2024-04-16 MED ORDER — LIDOCAINE 5 % EX PTCH
1.0000 | MEDICATED_PATCH | CUTANEOUS | Status: DC
  Administered 2024-04-16 – 2024-05-01 (×16): 1 via TRANSDERMAL
  Filled 2024-04-16 (×16): qty 1

## 2024-04-16 MED ORDER — PRAVASTATIN SODIUM 40 MG PO TABS
40.0000 mg | ORAL_TABLET | Freq: Every day | ORAL | Status: DC
  Administered 2024-04-16 – 2024-05-03 (×17): 40 mg via ORAL
  Filled 2024-04-16 (×17): qty 1

## 2024-04-16 MED ORDER — ONDANSETRON HCL 4 MG PO TABS
4.0000 mg | ORAL_TABLET | Freq: Four times a day (QID) | ORAL | Status: DC | PRN
  Administered 2024-04-17: 4 mg via ORAL
  Filled 2024-04-16: qty 1

## 2024-04-16 MED ORDER — CLOPIDOGREL BISULFATE 75 MG PO TABS
75.0000 mg | ORAL_TABLET | Freq: Every day | ORAL | Status: DC
Start: 2024-04-16 — End: 2024-04-20
  Administered 2024-04-16 – 2024-04-20 (×5): 75 mg via ORAL
  Filled 2024-04-16 (×5): qty 1

## 2024-04-16 MED ORDER — ACETAMINOPHEN 500 MG PO TABS
1000.0000 mg | ORAL_TABLET | Freq: Three times a day (TID) | ORAL | Status: DC
Start: 2024-04-16 — End: 2024-04-28
  Administered 2024-04-16 – 2024-04-28 (×36): 1000 mg via ORAL
  Filled 2024-04-16 (×35): qty 2

## 2024-04-16 MED ORDER — GABAPENTIN 300 MG PO CAPS
300.0000 mg | ORAL_CAPSULE | Freq: Two times a day (BID) | ORAL | Status: DC
  Administered 2024-04-16 – 2024-04-26 (×23): 300 mg via ORAL
  Filled 2024-04-16 (×24): qty 1

## 2024-04-16 MED ORDER — BUDESON-GLYCOPYRROL-FORMOTEROL 160-9-4.8 MCG/ACT IN AERO
2.0000 | INHALATION_SPRAY | Freq: Two times a day (BID) | RESPIRATORY_TRACT | Status: DC
  Administered 2024-04-16 – 2024-05-03 (×32): 2 via RESPIRATORY_TRACT
  Filled 2024-04-16 (×3): qty 5.9

## 2024-04-16 NOTE — Assessment & Plan Note (Signed)
 Receiving radiation.  Alk phos normal, CT rules out osseos mets.

## 2024-04-16 NOTE — Evaluation (Signed)
 Occupational Therapy Evaluation Patient Details Name: Corey Tate MRN: [id number] DOB: 1941-04-22 Today's Date: 04/16/2024   History of Present Illness   83 yo admitted with unrelenting back pain.  PMH is significant for HTN, severe COPD, PVD, s/p CEA and R EIA angioplasty, active smoker, hx of lung Ca on radiation, OSA on 2L O2 no CPAP, CKD,  6 previous back surgeries, RTHA and R TSA     Clinical Impressions Prior to admission, patient was able to manage his adls and some iadls at modified independent level with the help of his wife and family.  The pain in his back is severely limiting his ability to participate currently.  He demonstrates adequate rom and generalized strength but can only tolerate EOB for ~ 2- 5 minutes and standing was < 1 minute with OT before he needs to return to supine.  Therapy will be limited until pain is managed. He may benefit from a course of HHOT if symptoms persist to determine how best to do his daily activities.      If plan is discharge home, recommend the following:   A little help with bathing/dressing/bathroom;Assistance with cooking/housework;Assist for transportation;Help with stairs or ramp for entrance     Functional Status Assessment   Patient has had a recent decline in their functional status and demonstrates the ability to make significant improvements in function in a reasonable and predictable amount of time.     Equipment Recommendations   None recommended by OT     Recommendations for Other Services         Precautions/Restrictions   Precautions Precautions: Fall Recall of Precautions/Restrictions: Intact Precaution/Restrictions Comments: 2 L O2 Restrictions Weight Bearing Restrictions Per Provider Order: No     Mobility Bed Mobility Overal bed mobility: Modified Independent (using the hospital bed - raising his head prior to sitting)                  Transfers Overall transfer level: Needs  assistance Equipment used: Rolling walker (2 wheels) Transfers: Sit to/from Stand Sit to Stand: Contact guard assist           General transfer comment: limited by pain      Balance Overall balance assessment: Mild deficits observed, not formally tested                                         ADL either performed or assessed with clinical judgement   ADL Overall ADL's : Needs assistance/impaired Eating/Feeding: Independent   Grooming: Wash/dry hands;Set up       Lower Body Bathing: Moderate assistance       Lower Body Dressing: Moderate assistance   Toilet Transfer: Contact guard assist           Functional mobility during ADLs: Contact guard assist General ADL Comments: Patient demonstrates reduced flexibility with decreased ability to reach feet.  Issue is the pain is limiting his ability to tolerate any adl activity.  He demonstrates adequate ROM and general strength but can stand < 1 minute or sit < 2-5 minutes before the cramping starts.  The only thing that seems to help is to lay down and get on his R side.  The pain will ease off after ~ 5-8 minutes.     Vision Baseline Vision/History: 0 No visual deficits Ability to See in Adequate Light: 0 Adequate Vision Assessment?: No apparent  visual deficits     Perception Perception: Not tested       Praxis Praxis: Not tested       Pertinent Vitals/Pain Pain Assessment Pain Assessment: 0-10 Pain Score: 10-Worst pain ever Pain Descriptors / Indicators: Cramping, Grimacing, Guarding, Moaning, Shooting Pain Intervention(s): Limited activity within patient's tolerance, Repositioned, Premedicated before session     Extremity/Trunk Assessment Upper Extremity Assessment Upper Extremity Assessment: Overall WFL for tasks assessed   Lower Extremity Assessment Lower Extremity Assessment: Defer to PT evaluation   Cervical / Trunk Assessment Cervical / Trunk Assessment: Kyphotic    Communication Communication Communication: No apparent difficulties   Cognition Arousal: Alert Behavior During Therapy: WFL for tasks assessed/performed Cognition: No apparent impairments                               Following commands: Intact       Cueing  General Comments   Cueing Techniques: Verbal cues  on 2 L of O2, no family present   Exercises     Shoulder Instructions      Home Living Family/patient expects to be discharged to:: Private residence Living Arrangements: Spouse/significant other Available Help at Discharge: Family Type of Home: House Home Access: Stairs to enter Secretary/administrator of Steps: 1 Entrance Stairs-Rails: None Home Layout: One level     Bathroom Shower/Tub: Other (comment) (walk in seated tub with door/ shower)   Bathroom Toilet: Standard Bathroom Accessibility: Yes How Accessible: Accessible via walker Home Equipment: Grab bars - tub/shower;Cane - single Librarian, Academic (2 wheels);BSC/3in1          Prior Functioning/Environment Prior Level of Function : Independent/Modified Independent               ADLs Comments: Patient has had difficulty recently doing adls due to the pain in his back    OT Problem List: Decreased activity tolerance;Pain   OT Treatment/Interventions: Therapeutic activities;Patient/family education;Self-care/ADL training      OT Goals(Current goals can be found in the care plan section)   Acute Rehab OT Goals Patient Stated Goal: needs the pain to be under control OT Goal Formulation: With patient Time For Goal Achievement: 04/30/24 Potential to Achieve Goals: Fair   OT Frequency:  Min 1X/week    Co-evaluation              AM-PAC OT 6 Clicks Daily Activity     Outcome Measure Help from another person eating meals?: A Little Help from another person taking care of personal grooming?: A Little Help from another person toileting, which includes using toliet,  bedpan, or urinal?: A Lot Help from another person bathing (including washing, rinsing, drying)?: A Lot Help from another person to put on and taking off regular upper body clothing?: A Little Help from another person to put on and taking off regular lower body clothing?: A Lot 6 Click Score: 15   End of Session Equipment Utilized During Treatment: Rolling walker (2 wheels) Nurse Communication: Mobility status  Activity Tolerance: Patient limited by pain Patient left: in bed;with call bell/phone within reach  OT Visit Diagnosis: Unsteadiness on feet (R26.81);Pain Pain - Right/Left: Left Pain - part of body: Leg (back pain)                Time: 8766-8747 OT Time Calculation (min): 19 min Charges:  OT Evaluation $OT Eval Moderate Complexity: 1 Mod  Inocente Browner OTR/L   Inocente SHAUNNA Browner 04/16/2024, 1:18  PM

## 2024-04-16 NOTE — Plan of Care (Signed)

## 2024-04-16 NOTE — Assessment & Plan Note (Signed)
 7-10 days increased low back pain, like my sciatica.  Called his CT ruled out dissection, osseous mets, compression fracture.  No signs of infection.    Follows with Neurosurgery Dr. Joshua at [facility] - Schedule tylenol  - Oxycodone  PRN - Renal function good, no bleeding risk factors, will add NSAID - PT eval  - Hopefully, with opiates, NSAID, can mobilize with PT tomorrow and discharge to home in 1-2 days

## 2024-04-16 NOTE — H&P (Signed)
 History and Physical    Patient: Corey Tate FMW:[id number] DOB: 1940/07/01 DOA: 04/15/2024 DOS: the patient was seen and examined on 04/16/2024 PCP: Silvano Angeline FALCON, NP  Patient coming from: Home  Chief Complaint:  Chief Complaint  Patient presents with   Back Pain       HPI:  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  About one week PTA, patient's chronic low back pain worsened spontaneously, just woke up with it worse.  Radiating similar to prior sciatica.  Called his Neurosurgeron, Dr. Joshua, who has an appointment set up in 2 weeks, but couldn't offer pain medication over the phone, so the patient came to the ER.    In the ER, UA normal, alk phos normal.  CTA C/A/P showed diffuse atherosclerotic disease but no dissection and no bony change to thoracic or lumbar spine.    Patient required 3 doses of IV opiates and still couldn't stand, so hospitalists asked to observe for pain control, PT eval.      Review of Systems  Constitutional:  Negative for chills, fever and malaise/fatigue.  Respiratory:  Positive for cough (chronic, no recent worsening). Negative for hemoptysis, sputum production, shortness of breath and wheezing.   Cardiovascular:  Negative for chest pain.  Musculoskeletal:  Positive for back pain. Negative for falls, joint pain, myalgias and neck pain.  Neurological:  Negative for dizziness, sensory change, speech change, focal weakness, seizures, loss of consciousness and weakness.  All other systems reviewed and are negative.    Past Medical History:  Diagnosis Date   Abdominal aortic aneurysm (AAA) without rupture 05/28/2023   Abnormal cardiac CT angiography 01/31/2020   Anxiety 12/11/2017   Arthritis    Asthma    ?   Atherosclerosis of native artery of both lower extremities 01/31/2016    Atrial flutter by electrocardiogram (HCC) 12/10/2016   Bilateral carotid artery stenosis 01/31/2016   CAD (coronary artery disease)    a. 01/2020- DFR-guided CSI orbital atherectomy with DES PCI of proximal LAD with staged orbital atherectomy and DES PCI of the mid and distal LCx.   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   Carotid bruit 12/11/2017   Chronic anticoagulation 12/31/2017   Chronic back pain greater than 3 months duration 10/02/2014   CKD (chronic kidney disease), stage II 02/08/2020   COPD, severe (HCC) 03/04/2016   Coronary artery disease involving native coronary artery of native heart with angina pectoris 01/31/2020   Cough 03/04/2016   Degenerative arthritis of hip 04/02/2012   DOE (dyspnea on exertion)  - as Angina Equivalent 01/31/2020   Dyslipidemia 09/26/2015   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   GERD (gastroesophageal reflux disease)    H/O varicella 12/11/2017   History of atrial fibrillation 12/10/2016   HTN (hypertension) 02/08/2020   Hypertension    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   Lumbar spondylosis 11/06/2014   Neck mass 07/15/2022   Nicotine dependence, uncomplicated 09/26/2015   Nocturnal hypoxemia 03/04/2016   PAF (paroxysmal atrial fibrillation) (HCC) 12/10/2016   Peripheral vascular disease    Pneumonia 2021   S/P lumbar fusion 10/20/2022   S/P lumbar laminectomy 10/21/2022   Sebaceous cyst 07/15/2022   Shortness of breath    USES OXYGEN  AT  NIGHT--HX OF RIGHT LOWER LOBE PULMONARY NODULE--FOLLOWED BY PT'S MEDICAL DOCTOR AND HAS HAD FOR YEARS   Smoking greater than 40 pack years 03/04/2016   Spinal stenosis of lumbar region 10/02/2014   Spondylolisthesis 10/16/2014   Syncope 08/04/2016   Past Surgical History:  Procedure Laterality Date   ATHERECTOMY  01/31/2020   Successful DFR guided, CSI Orbital Atherectomy-DES PCI of proximal LA   BACK SURGERY     LOWER BACK SURGERY X  3 - FUSION   CARPAL TUNNEL RELEASE AND SURGERY LEFT ELBOW     CATARACT EXTRACTION     CERVICAL DISC SURGERY     FUSION - ONLY SLIGHT LIMITATION IN NECK MOVEMENT   CORONARY ATHERECTOMY N/A 01/31/2020   Procedure: CORONARY ATHERECTOMY;  Surgeon: Anner Alm ORN, MD;  Location: [facility] INVASIVE CV LAB;  Service: Cardiovascular;  Laterality: N/A;   CORONARY ATHERECTOMY N/A 02/08/2020   Procedure: CORONARY ATHERECTOMY;  Surgeon: Anner Alm ORN, MD;  Location: [facility] INVASIVE CV LAB;  Service: Cardiovascular;  Laterality: N/A;   CORONARY PRESSURE/FFR STUDY N/A 01/31/2020   Procedure: INTRAVASCULAR PRESSURE WIRE/FFR STUDY;  Surgeon: Anner Alm ORN, MD;  Location: [facility] INVASIVE CV LAB;  Service: Cardiovascular;  Laterality: N/A;   CORONARY STENT INTERVENTION N/A 01/31/2020   Procedure: CORONARY STENT INTERVENTION;  Surgeon: Anner Alm ORN, MD;  Location: [facility] INVASIVE CV LAB;  Service: Cardiovascular;  Laterality: N/A;   LAMINECTOMY WITH POSTERIOR LATERAL ARTHRODESIS LEVEL 3 Bilateral 10/20/2022   Procedure: Laminectomy and Foraminotomy - Lumbar two-Lumbar three - bilateral - Lumbar three-Lumbar four - right - Lumbar four-Lumbar five - right, posterolateral fusion Lumbar three-five with pedicle screw fixation Lumbar three-five;  Surgeon: Joshua Alm RAMAN, MD;  Location: [facility] OR;  Service: Neurosurgery;  Laterality: Bilateral;   LEFT HEART CATH N/A 02/08/2020   Procedure: Left Heart Cath;  Surgeon: Anner Alm ORN, MD;  Location: [facility] INVASIVE CV LAB;  Service: Cardiovascular;  Laterality: N/A;   LEFT HEART CATH AND CORONARY ANGIOGRAPHY N/A 01/31/2020   Procedure: LEFT HEART CATH AND CORONARY ANGIOGRAPHY;  Surgeon: Anner Alm ORN, MD;  Location: [facility] INVASIVE CV LAB;  Service: Cardiovascular;  Laterality: N/A;   POSTERIOR CERVICAL FUSION/FORAMINOTOMY Bilateral 09/30/2012   Procedure: CERVICAL SEVEN AND THORACIC ONE BILATERAL POSTERIOR CERVICAL FUSION/FORAMINOTOMY ;  Surgeon: Alm RAMAN Joshua, MD;  Location: [facility] NEURO ORS;   Service: Neurosurgery;  Laterality: Bilateral;   RIGHT SHOULDER SURGERY     SINUS SURGERY WITH INSTATRAK     TOTAL HIP ARTHROPLASTY  04/02/2012   Procedure: TOTAL HIP ARTHROPLASTY ANTERIOR APPROACH;  Surgeon: Lonni CINDERELLA Poli, MD;  Location: [facility] ORS;  Service: Orthopedics;  Laterality: Right;  Right Total Hip Arthroplasty   VASCULAR SURGERY     STENT PLACEMENT RIGHT LEG AND ROTOR ROOTER LEFT LEG   Social History:  reports that he has been smoking cigarettes. He has a 55 pack-year smoking history. He has never used smokeless tobacco. He reports current alcohol use of about 21.0 standard drinks of alcohol per week. He reports that he does not use drugs.  Allergies  Allergen Reactions   Ferra-Caps [Iron] Hives   Streptomycin Hives and Itching    Family History  Problem Relation Age of Onset   Cancer Father    Diabetes Sister    Diabetes Brother     Prior to Admission medications   Medication Sig Start Date End Date Taking? Authorizing Provider  albuterol  (PROVENTIL ) (2.5 MG/3ML) 0.083% nebulizer solution Take 2.5 mg by nebulization every 6 (six) hours as needed for  shortness of breath or wheezing. 03/29/24  Yes [provider]  amLODipine  (NORVASC ) 5 MG tablet Take 1 tablet (5 mg total) by mouth every other day. 12/15/22  Yes Monetta Redell PARAS, MD  aspirin  EC 81 MG tablet Take 81 mg by mouth daily.   Yes [provider]  BREZTRI AEROSPHERE 160-9-4.8 MCG/ACT AERO Inhale 2 puffs into the lungs 2 (two) times daily. 09/29/22  Yes [provider]  clopidogrel  (PLAVIX ) 75 MG tablet Take 1 tablet (75 mg total) by mouth daily. 03/07/21  Yes Monetta Redell PARAS, MD  docusate sodium  (COLACE) 100 MG capsule Take 100 mg by mouth daily as needed (constipation).   Yes [provider]  ezetimibe  (ZETIA ) 10 MG tablet Take 10 mg by mouth daily.   Yes [provider]  gabapentin  (NEURONTIN ) 300 MG capsule Take 300 mg by mouth 2 (two) times daily. 11/27/22  Yes  [provider]  ibuprofen  (ADVIL ) 200 MG tablet Take 200-400 mg by mouth daily as needed for headache or moderate pain.   Yes [provider]  lansoprazole (PREVACID) 15 MG capsule Take 15 mg by mouth daily at 12 noon.   Yes [provider]  methocarbamol  (ROBAXIN ) 500 MG tablet Take 1 tablet (500 mg total) by mouth every 6 (six) hours as needed for muscle spasms. 10/21/22  Yes Joshua Alm Hamilton, MD  Omega-3 Fatty Acids (FISH OIL) 1000 MG CAPS Take 1,000 mg by mouth daily.   Yes [provider]  OXYGEN  Inhale 2 L into the lungs at bedtime.   Yes [provider]  pravastatin  (PRAVACHOL ) 40 MG tablet Take 1 tablet (40 mg total) by mouth daily. 03/07/21  Yes Monetta Redell PARAS, MD  Vitamin D-Vitamin K (K2 PLUS D3 PO) Take 1 tablet by mouth daily.   Yes [provider]  nitroGLYCERIN  (NITROSTAT ) 0.4 MG SL tablet Place 0.4 mg under the tongue every 5 (five) minutes as needed for chest pain. Patient not taking: Reported on 04/15/2024    [provider]    Physical Exam: Vitals:   04/15/24 2345 04/16/24 0000 04/16/24 0015 04/16/24 0048  BP: (!) 132/56 (!) 160/66 (!) 155/61 (!) 146/71  Pulse: 61 64 60 73  Resp: 18 12 14 17   Temp:    97.6 F (36.4 C)  SpO2: 100% 100% 99% 93%  Weight:   62.6 kg   Height:   5' 11 (1.803 m)    Thin elderly male, lying in bed, appears uncomfortable, interactive to questions Anicteric, conjunctiva pink, lids and lashes normal.  No nasal deformity, discharge, or epistaxis. RRR, no murmurs, no peripheral edema Respiratory normal, lungs clear without rales or wheezes, good air movement bilaterally Abdomen soft, no tenderness palpation or guarding, no ascites or distention Face symmetric, speech fluent, oriented to person, place, and time Upper extremity strength 5/5 and symmetric Lower extremity strength 5-/5 and symmetric, no loss of sensation to bilateral lower extremities.  Straight leg raise is painful  more on the left.     Data Reviewed: Basic metabolic panel shows reduced albumin , otherwise normal electrolytes and renal function Lactic acid normal Troponin normal CBC shows mild anemia COVID and flu and respiratory virus panel negative Urinalysis rules out UTI CT angiogram of the abdomen and pelvis report reviewed, shows no acute findings Reformatted CT of the thoracic and lumbar spine shows no evidence of compression fracture, lucency, metastasis, or infection EKG, personally reviewed, shows normal sinus rhythm    Assessment and Plan: * Back pain  7-10 days increased low back pain, like my sciatica.  Called his CT ruled out dissection, osseous mets, compression fracture.  No signs of infection.    Follows with Neurosurgery Dr. 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  Alk phos normal, CT rules out osseos mets.    Paroxysmal atrial fibrillation (HCC) Not on anticoagulation, unclear reason.  In sinus rhythm here.  COPD, severe (HCC) Chronic respiratory failure with hypoxia Uses home O2 PRN and noct.  No wheezing on exam. - Continue home oxygen  - Continue Breztri - Continue PPI          Advance Care Planning: FULL  Consults: None needed  Family Communication: Daughter by phone  Severity of Illness: The appropriate patient status for this patient is OBSERVATION. Observation status is judged to be reasonable and necessary in  order to provide the required intensity of service to ensure the patient's safety. The patient's presenting symptoms, physical exam findings, and initial radiographic and laboratory data in the context of their medical condition is felt to place them at decreased risk for further clinical deterioration. Furthermore, it is anticipated that the patient will be medically stable for discharge from the hospital within 2 midnights of admission.   Author: Lonni SHAUNNA Dalton, MD 04/16/2024 1:33 AM  For on call review [email or web address].

## 2024-04-16 NOTE — Assessment & Plan Note (Signed)
No CPAP 

## 2024-04-16 NOTE — Progress Notes (Signed)
 Progress Note   Patient: Corey Tate FMW:[id number] DOB: 12/17/1940 DOA: 04/15/2024     0 DOS: the patient was seen and examined on 04/16/2024   Brief hospital course: 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  He made appointment witj Neurosurgeron, Dr. Joshua in 2 weeks. In ED CTA C/A/P showed diffuse atherosclerotic disease but no dissection and no bony change to thoracic or lumbar spine.    Patient required 3 doses of IV opiates and still couldn't stand, so hospitalists asked to observe for pain control, PT eval.  Assessment and Plan: Intractable back pain 7-10 days increased low back pain, like my sciatica.  CT thoracolumbar spine multilevel degenerative disease noted, prior spinal instrumentation noted. Follows with Neurosurgery Dr. Joshua at [facility], has appointment in 1 week. Continue schedule tylenol , oxycodone  PRN, robaxin . PT/ OT eval for dc plan.  Coronary artery disease involving native coronary artery of native heart with angina pectoris.  Peripheral vascular disease. Essential hypertension- BP elevated Continue amlodipine . Continue aspirin , Plavix , Zetia , pravastatin   OSA (obstructive sleep apnea) No CPAP  Cancer of upper lobe of right lung [facility]) Receiving radiation. Alk phos normal, CT ruled out osseos mets.    Paroxysmal atrial fibrillation (HCC) Not on anticoagulation, unclear reason. Currently sinus rhythm, continue telemetry.  COPD, severe (HCC) Chronic respiratory failure with hypoxia Uses home O2 PRN and at night.  No wheezing on exam. Continue home oxygen  2L. Continue Breztri. Continue PPI.     Out of bed to chair. Incentive spirometry. Nursing supportive care. Fall, aspiration precautions. Diet:  Diet Orders (From admission, onward)     Start     Ordered    04/16/24 0038  Diet Heart Room service appropriate? Yes; Fluid consistency: Thin  Diet effective now       Question Answer Comment  Room service appropriate? Yes   Fluid consistency: Thin      04/16/24 0037           DVT prophylaxis: enoxaparin (LOVENOX) injection 40 mg Start: 04/16/24 1000  Level of care: Telemetry Medical   Code Status: Full Code  Subjective: Patient is seen and examined today morning. He is in distress with left sided back pain. Did not get out of bed. He is on 2L supplemental O2.  Physical Exam: Vitals:   04/16/24 0226 04/16/24 0510 04/16/24 0845 04/16/24 1237  BP: (!) 155/64 132/66  (!) 149/71  Pulse: 76 63 70 65  Resp: 16 20 19 18   Temp: 98 F (36.7 C) 98 F (36.7 C)  97.8 F (36.6 C)  TempSrc: Oral Oral    SpO2: 92% 96% 95% 95%  Weight: 62.5 kg     Height: 5' 10.98 (1.803 m)       General - Elderly thin built Caucasian male, distress due to back pain HEENT - PERRLA, EOMI, atraumatic head, non tender sinuses. Lung - Clear, basal rales, no rhonchi, wheezes. Heart - S1, S2 heard, no murmurs, rubs, trace pedal edema. Abdomen - Soft, non tender, bowel sounds good Neuro - Alert, awake and oriented x 3, non focal exam. Skin - Warm and dry.  Data Reviewed:      Latest Ref Rng & Units 04/16/2024    1:38 AM 04/15/2024    4:28 PM 10/14/2022   10:44 AM  CBC  WBC  4.0 - 10.5 K/uL 7.9  8.7  8.0   Hemoglobin 13.0 - 17.0 g/dL 87.5  88.0  87.0   Hematocrit 39.0 - 52.0 % 36.6  35.7  37.6   Platelets 150 - 400 K/uL 178  197  178       Latest Ref Rng & Units 04/16/2024    1:38 AM 04/15/2024    4:28 PM 10/14/2022   10:44 AM  BMP  Glucose 70 - 99 mg/dL 83  897  856   BUN 8 - 23 mg/dL 12  15  18    Creatinine 0.61 - 1.24 mg/dL 9.09  8.99  8.80   Sodium 135 - 145 mmol/L 136  137  135   Potassium 3.5 - 5.1 mmol/L 3.8  4.1  3.9   Chloride 98 - 111 mmol/L 95  101  98   CO2 22 - 32 mmol/L 28  28  26    Calcium 8.9 - 10.3 mg/dL 9.2  9.2  9.8    CT  L-SPINE NO CHARGE Result Date: 04/15/2024 CLINICAL DATA:  Flank and back pain radiating to the legs EXAM: CT Thoracic and Lumbar spine without contrast TECHNIQUE: Multiplanar CT images of the thoracic and lumbar spine were reconstructed from contemporary CT of the Chest, Abdomen, and Pelvis. RADIATION DOSE REDUCTION: This exam was performed according to the departmental dose-optimization program which includes automated exposure control, adjustment of the mA and/or kV according to patient size and/or use of iterative reconstruction technique. CONTRAST:  None or No additional COMPARISON:  Chest CT 02/03/2024, MRI 02/12/2023 FINDINGS: CT THORACIC SPINE FINDINGS Alignment: Normal. Vertebrae: No acute fracture or focal pathologic process. Paraspinal and other soft tissues: No acute finding Disc levels: Partial ankylosis T5-T6 disc space. Multilevel degenerative osteophyte. No high-grade canal stenosis. CT LUMBAR SPINE FINDINGS Segmentation: 5 lumbar type vertebrae. Alignment: Grade 1 anterolisthesis L4 on L5. Vertebrae: No acute fracture or focal pathologic process. Paraspinal and other soft tissues: No acute finding Disc levels: Posterior spinal instrumentation L3 through L5. Lucency about the pedicular screws at L5. At L1-L2, disc bulge, moderate severe facet arthropathy and moderate severe canal stenosis. No high-grade foraminal narrowing. At L2-L3, disc space narrowing. Diffuse disc bulge. Moderate severe facet degenerative changes. At least mild canal stenosis. Posterior decompression changes. At L3-L4, posterior decompression changes. Limited by hardware artifact. Disc bulge and advanced facet degenerative changes. Mild canal stenosis. At L4-L5, posterior decompression changes. Artifact from hardware limits assessment of the canal. At L5-S1, disc space narrowing. Moderate facet degenerative changes. No high-grade canal stenosis. Moderate severe bilateral foraminal narrowing. IMPRESSION: 1. No CT evidence for  acute osseous abnormality of the thoracic or lumbar spine. 2. Posterior spinal instrumentation L3 through L5. Lucency about the pedicular screws at L5 correlate for loosening. 3. Multilevel degenerative changes of the lumbar spine as described above. Electronically Signed   By: Luke Bun M.D.   On: 04/15/2024 20:25   CT T-SPINE NO CHARGE Result Date: 04/15/2024 CLINICAL DATA:  Flank and back pain radiating to the legs EXAM: CT Thoracic and Lumbar spine without contrast TECHNIQUE: Multiplanar CT images of the thoracic and lumbar spine were reconstructed from contemporary CT of the Chest, Abdomen, and Pelvis. RADIATION DOSE REDUCTION: This exam was performed according to the departmental dose-optimization program which includes automated exposure control, adjustment of the mA and/or kV according to patient size and/or use of iterative reconstruction technique. CONTRAST:  None or No additional COMPARISON:  Chest CT 02/03/2024, MRI 02/12/2023 FINDINGS: CT THORACIC SPINE FINDINGS Alignment:  Normal. Vertebrae: No acute fracture or focal pathologic process. Paraspinal and other soft tissues: No acute finding Disc levels: Partial ankylosis T5-T6 disc space. Multilevel degenerative osteophyte. No high-grade canal stenosis. CT LUMBAR SPINE FINDINGS Segmentation: 5 lumbar type vertebrae. Alignment: Grade 1 anterolisthesis L4 on L5. Vertebrae: No acute fracture or focal pathologic process. Paraspinal and other soft tissues: No acute finding Disc levels: Posterior spinal instrumentation L3 through L5. Lucency about the pedicular screws at L5. At L1-L2, disc bulge, moderate severe facet arthropathy and moderate severe canal stenosis. No high-grade foraminal narrowing. At L2-L3, disc space narrowing. Diffuse disc bulge. Moderate severe facet degenerative changes. At least mild canal stenosis. Posterior decompression changes. At L3-L4, posterior decompression changes. Limited by hardware artifact. Disc bulge and advanced  facet degenerative changes. Mild canal stenosis. At L4-L5, posterior decompression changes. Artifact from hardware limits assessment of the canal. At L5-S1, disc space narrowing. Moderate facet degenerative changes. No high-grade canal stenosis. Moderate severe bilateral foraminal narrowing. IMPRESSION: 1. No CT evidence for acute osseous abnormality of the thoracic or lumbar spine. 2. Posterior spinal instrumentation L3 through L5. Lucency about the pedicular screws at L5 correlate for loosening. 3. Multilevel degenerative changes of the lumbar spine as described above. Electronically Signed   By: Luke Bun M.D.   On: 04/15/2024 20:25   CT Angio Chest/Abd/Pel for Dissection W and/or Wo Contrast Result Date: 04/15/2024 CLINICAL DATA:  Knife-like back pain history of known AAA EXAM: CT ANGIOGRAPHY CHEST, ABDOMEN AND PELVIS TECHNIQUE: Non-contrast CT of the chest was initially obtained. Multidetector CT imaging through the chest, abdomen and pelvis was performed using the standard protocol during bolus administration of intravenous contrast. Multiplanar reconstructed images and MIPs were obtained and reviewed to evaluate the vascular anatomy. RADIATION DOSE REDUCTION: This exam was performed according to the departmental dose-optimization program which includes automated exposure control, adjustment of the mA and/or kV according to patient size and/or use of iterative reconstruction technique. CONTRAST:  75mL OMNIPAQUE  IOHEXOL  350 MG/ML SOLN COMPARISON:  Chest CT 02/03/2024, PET CT 08/27/2022, CT angiography 01/26/2024 FINDINGS: CTA CHEST FINDINGS Cardiovascular: Non contrasted images of the chest demonstrate no acute intramural hematoma. Advanced aortic atherosclerosis. No aneurysm or dissection. Multi vessel coronary vascular calcification. Normal cardiac size. No pericardial effusion. Mediastinum/Nodes: Patent trachea. No thyroid mass. No suspicious lymph nodes. Esophagus within normal limits. Lungs/Pleura:  Emphysema. Interim development of heterogeneous right lower lobe airspace disease and ground-glass density. Inferior right upper lobe irregular pulmonary nodule measuring about 8 x 5 mm on series 7, image 95, previously 9 x 5 mm. No new pulmonary nodule, pleural effusion, or pneumothorax. Debris within the right lower lobe bronchi. Bronchial wall thickening. Fibrosis and scarring medial left base Musculoskeletal: Sternum appears intact. No acute osseous abnormality. See separately dictated spine CT Review of the MIP images confirms the above findings. CTA ABDOMEN AND PELVIS FINDINGS VASCULAR Aorta: Atherosclerosis. No dissection. No occlusive disease. Infrarenal abdominal aortic aneurysm measuring 3.9 cm maximum, previously 3.8 cm. Prominent intraluminal thrombus. Negative for retroperitoneal hematoma or surrounding stranding. Celiac: Moderate stenosis at the origin. Diffuse calcific and noncalcific plaque. Distal flow enhancement present. No aneurysm SMA: Heavily calcified at the origin with at least moderate stenosis. Distal flow enhancement present. Renals: 2 right and single left renal arteries. Dominant superior right renal artery and the main left renal artery demonstrate heavy calcific disease with moderate severe stenosis of the proximal arteries. IMA: High-grade stenosis at the origin. Distal flow enhancement present Inflow: Negative for aneurysm or dissection. Advanced atherosclerosis. Severe  stenosis that the origin of the right internal iliac artery with probable short segment chronic occlusion. Severe diffuse disease of the external iliac vessels with at least moderate stenosis of the distal right external iliac artery. Stenotic appearing bilateral SFA origins. Veins: Suboptimally assessed Review of the MIP images confirms the above findings. NON-VASCULAR Hepatobiliary: No focal liver abnormality is seen. No gallstones, gallbladder wall thickening, or biliary dilatation. Pancreas: Unremarkable. No  pancreatic ductal dilatation or surrounding inflammatory changes. Spleen: Normal in size without focal abnormality. Adrenals/Urinary Tract: Thickened left adrenal gland without mass. Normal right adrenal gland. No hydronephrosis. Nonobstructing small left-sided kidney stone. The bladder is unremarkable Stomach/Bowel: The stomach is nonenlarged. There is no dilated small bowel. No acute bowel wall thickening Lymphatic: No suspicious lymph nodes Reproductive: Negative prostate partially obscured Other: Negative for ascites or free air Musculoskeletal: Right hip replacement with artifact. Hardware in the spine, see separately dictated spine CT. Review of the MIP images confirms the above findings. IMPRESSION: 1. Negative for acute aortic dissection. 2. Infrarenal abdominal aortic aneurysm measuring 3.9 cm maximum, previously 3.8 cm. Recommend follow-up ultrasound every 3 years. (Ref.: J Vasc Surg. 2018; 67:2-77 and J Am Coll Radiol 2013;10(10):789-794.) 3. Interim development of heterogeneous right lower lobe airspace disease and ground-glass density, suspect for pneumonia, possible aspiration. Debris within the right lower lobe bronchi. 4. Emphysema. Stable irregular right upper lobe pulmonary nodule. Reference chest CT 02/03/2024 5. Advanced aortic atherosclerosis. 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 At least moderate stenosis of the proximal renal arteries. 6. Nonobstructing left kidney stone. Aortic Atherosclerosis (ICD10-I70.0) and Emphysema (ICD10-J43.9). Electronically Signed   By: Luke Bun M.D.   On: 04/15/2024 20:08    Family Communication: Discussed with patient, understand and agree. All questions answered.  Disposition: Status is: Observation The patient will require care spanning > 2 midnights and should be moved to inpatient because: severe back pain, pain control, PT/ OT  Planned Discharge Destination: Home with Home Health     Time spent: 44  minutes  Author: Concepcion Riser, MD 04/16/2024 1:39 PM Secure chat 7am to 7pm For on call review [email or web address].

## 2024-04-16 NOTE — Assessment & Plan Note (Addendum)
 Not on anticoagulation, unclear reason.  In sinus rhythm here.

## 2024-04-16 NOTE — Care Management Obs Status (Signed)
 MEDICARE OBSERVATION STATUS NOTIFICATION   Patient Details  Name: Corey Tate MRN: [id number] Date of Birth: April 15, 1941   Medicare Observation Status Notification Given:  Yes    Carletha Spruce, RN 04/16/2024, 4:01 PM

## 2024-04-16 NOTE — Assessment & Plan Note (Signed)
 Chronic respiratory failure with hypoxia Uses home O2 PRN and noct.  No wheezing on exam. - Continue home oxygen  - Continue Breztri - Continue PPI

## 2024-04-16 NOTE — Assessment & Plan Note (Addendum)
 Peripheral vascular disease Essential hypertension BP elevated - Continue amlodipine  - Continue aspirin , Plavix , Zetia , pravastatin 

## 2024-04-16 NOTE — Evaluation (Signed)
 Physical Therapy Evaluation Patient Details Name: Corey Tate MRN: [id number] DOB: July 07, 1940 Today's Date: 04/16/2024  History of Present Illness  83 yo admitted 10/24 with unrelenting back pain.  PMH is significant for HTN, severe COPD, PVD, s/p CEA and R EIA angioplasty, active smoker, hx of lung Ca on radiation, OSA on 2L O2 no CPAP, CKD,  6 previous back surgeries, RTHA and R TSA  Clinical Impression  Pt admitted with above diagnosis. Previously ambulatory with a walker, daughter drives him to appointments and for shopping. States he and his wife take care of each other with different needs in the house. Denies any recent falls. Pain radiates from back into bil LEs but LLE worse than RLE. There is some weakness more appreciable in LLE as well. Skin warm, non-mottled in LEs. He required CGA for transfers and gait up to 45 feet today using RW for support. Agrees RW is helpful and will be using at d/c for support. Will follow acutely and progress as tolerated. Pt would benefit from HHPT follow-up upon d/c due to ongoing back pain and increased difficulty with mobility recently. Pt currently with functional limitations due to the deficits listed below (see PT Problem List). Pt will benefit from acute skilled PT to increase their independence and safety with mobility to allow discharge.           If plan is discharge home, recommend the following: A little help with walking and/or transfers;A little help with bathing/dressing/bathroom;Assistance with cooking/housework;Help with stairs or ramp for entrance;Assist for transportation   Can travel by private vehicle        Equipment Recommendations None recommended by PT  Recommendations for Other Services       Functional Status Assessment Patient has had a recent decline in their functional status and demonstrates the ability to make significant improvements in function in a reasonable and predictable amount of time.     Precautions /  Restrictions Precautions Precautions: Fall Recall of Precautions/Restrictions: Intact Precaution/Restrictions Comments: 2 L O2 Restrictions Weight Bearing Restrictions Per Provider Order: No      Mobility  Bed Mobility Overal bed mobility: Needs Assistance Bed Mobility: Rolling, Sidelying to Sit Rolling: Supervision, Used rails Sidelying to sit: Supervision       General bed mobility comments: Educated on log roll technique for comfort. Used rail slow to rise but without assist.    Transfers Overall transfer level: Needs assistance Equipment used: Rolling walker (2 wheels) Transfers: Sit to/from Stand Sit to Stand: Contact guard assist           General transfer comment: CGA for safety, fair power up, slightly flexed at trunk, stabilizes with RW support.    Ambulation/Gait Ambulation/Gait assistance: Contact guard assist Gait Distance (Feet): 45 Feet Assistive device: Rolling walker (2 wheels) Gait Pattern/deviations: Step-through pattern, Decreased stride length, Trunk flexed Gait velocity: dec Gait velocity interpretation: <1.31 ft/sec, indicative of household ambulator   General Gait Details: Mild instability but able to self correct with reliance on RW for support. Educated on proper use including proximity to maximize UE support. No buckling noted. Slower and guarded.  Stairs            Wheelchair Mobility     Tilt Bed    Modified Rankin (Stroke Patients Only)       Balance Overall balance assessment: Needs assistance Sitting-balance support: No upper extremity supported, Feet supported Sitting balance-Leahy Scale: Good     Standing balance support: Bilateral upper extremity supported Standing balance-Leahy  Scale: Poor                               Pertinent Vitals/Pain Pain Assessment Pain Assessment: Faces Faces Pain Scale: Hurts whole lot Pain Descriptors / Indicators: Cramping, Grimacing, Guarding, Radiating Pain  Intervention(s): Limited activity within patient's tolerance, Monitored during session, Repositioned    Home Living Family/patient expects to be discharged to:: Private residence Living Arrangements: Spouse/significant other Available Help at Discharge: Family Type of Home: House Home Access: Stairs to enter Entrance Stairs-Rails: None Entrance Stairs-Number of Steps: 1   Home Layout: One level Home Equipment: Grab bars - tub/shower;Cane - single Librarian, Academic (2 wheels);BSC/3in1      Prior Function Prior Level of Function : Independent/Modified Independent             Mobility Comments: Denies falls, uses RW ADLs Comments: Patient has had difficulty recently doing adls due to the pain in his back     Extremity/Trunk Assessment   Upper Extremity Assessment Upper Extremity Assessment: Defer to OT evaluation    Lower Extremity Assessment Lower Extremity Assessment: Generalized weakness (pain limited, distal LLE appears weaker than Rt, moves against gravity.)    Cervical / Trunk Assessment Cervical / Trunk Assessment: Kyphotic  Communication   Communication Communication: Impaired Factors Affecting Communication: Reduced clarity of speech    Cognition Arousal: Alert Behavior During Therapy: WFL for tasks assessed/performed   PT - Cognitive impairments: No apparent impairments                         Following commands: Intact       Cueing Cueing Techniques: Verbal cues     General Comments General comments (skin integrity, edema, etc.): Educated on symptom awareness, frequent repositioning.    Exercises General Exercises - Lower Extremity Ankle Circles/Pumps: AROM, Both, 10 reps, Seated Quad Sets: Strengthening, Both, 10 reps, Seated Gluteal Sets: Strengthening, Both, 10 reps, Seated   Assessment/Plan    PT Assessment Patient needs continued PT services  PT Problem List Decreased strength;Decreased activity tolerance;Decreased  balance;Decreased mobility;Decreased knowledge of use of DME;Pain       PT Treatment Interventions DME instruction;Gait training;Stair training;Functional mobility training;Therapeutic activities;Therapeutic exercise;Balance training;Neuromuscular re-education;Patient/family education;Modalities    PT Goals (Current goals can be found in the Care Plan section)  Acute Rehab PT Goals Patient Stated Goal: Reduce pain PT Goal Formulation: With patient Time For Goal Achievement: 04/30/24 Potential to Achieve Goals: Good    Frequency Min 2X/week     Co-evaluation               AM-PAC PT 6 Clicks Mobility  Outcome Measure Help needed turning from your back to your side while in a flat bed without using bedrails?: None Help needed moving from lying on your back to sitting on the side of a flat bed without using bedrails?: A Little Help needed moving to and from a bed to a chair (including a wheelchair)?: A Little Help needed standing up from a chair using your arms (e.g., wheelchair or bedside chair)?: A Little Help needed to walk in hospital room?: A Little Help needed climbing 3-5 steps with a railing? : A Lot 6 Click Score: 18    End of Session Equipment Utilized During Treatment: Gait belt Activity Tolerance: Patient limited by pain Patient left: in chair;with call bell/phone within reach;with chair alarm set   PT Visit Diagnosis: Unsteadiness on feet (  R26.81);Other abnormalities of gait and mobility (R26.89);Muscle weakness (generalized) (M62.81);Difficulty in walking, not elsewhere classified (R26.2);Pain;Other symptoms and signs involving the nervous system (R29.898) Pain - part of body:  (back and BIL LEs Lt>Rt)    Time: 1500-1530 PT Time Calculation (min) (ACUTE ONLY): 30 min   Charges:   PT Evaluation $PT Eval Low Complexity: 1 Low PT Treatments $Therapeutic Activity: 8-22 mins PT General Charges $$ ACUTE PT VISIT: 1 Visit         Leontine Roads, PT,  DPT [facility]  Rehabilitation Services Physical Therapist Office: [phone] Website: [email or web address]   Leontine GORMAN Roads 04/16/2024, 4:37 PM

## 2024-04-17 DIAGNOSIS — M5440 Lumbago with sciatica, unspecified side: Secondary | ICD-10-CM | POA: Diagnosis not present

## 2024-04-17 DIAGNOSIS — I25119 Atherosclerotic heart disease of native coronary artery with unspecified angina pectoris: Secondary | ICD-10-CM | POA: Diagnosis not present

## 2024-04-17 DIAGNOSIS — I739 Peripheral vascular disease, unspecified: Secondary | ICD-10-CM | POA: Diagnosis not present

## 2024-04-17 DIAGNOSIS — C3411 Malignant neoplasm of upper lobe, right bronchus or lung: Secondary | ICD-10-CM | POA: Diagnosis not present

## 2024-04-17 MED ORDER — AZITHROMYCIN 500 MG PO TABS
500.0000 mg | ORAL_TABLET | ORAL | Status: AC
  Administered 2024-04-17 – 2024-04-19 (×3): 500 mg via ORAL
  Filled 2024-04-17 (×3): qty 1

## 2024-04-17 MED ORDER — POLYETHYLENE GLYCOL 3350 17 G PO PACK
17.0000 g | PACK | Freq: Every day | ORAL | Status: DC
  Administered 2024-04-17 – 2024-04-26 (×10): 17 g via ORAL
  Filled 2024-04-17 (×10): qty 1

## 2024-04-17 NOTE — Plan of Care (Signed)

## 2024-04-17 NOTE — Progress Notes (Signed)
 Pharmacy Progress Note  This patient is receiving the antibiotic(s) azithromycin by the intravenous route. Based on criteria approved by the Pharmacy and Therapeutics Committee and the Infectious Disease Division, the antibiotic(s) is/are being converted to equivalent oral dose form(s). These criteria include:  Patient being treated for a respiratory tract infection, urinary tract infection, cellulitis, or Clostridium difficile associated diarrhea The patient is not neutropenic and does not exhibit a GI malabsorption state The patient is eating (either orally or per tube) and/or has been taking other orally administered medications for at least 24 hours The patient is improving clinically  Maurilio Patten, PharmD PGY1 Pharmacy Resident [facility] 04/17/2024 2:15 PM

## 2024-04-17 NOTE — Progress Notes (Signed)
 Mobility Specialist: Progress Note   04/17/24 1454  Mobility  Activity Ambulated with assistance  Level of Assistance Contact guard assist, steadying assist  Assistive Device Four wheel walker  Distance Ambulated (ft) 50 ft  Activity Response Tolerated well  Mobility Referral Yes  Mobility visit 1 Mobility  Mobility Specialist Start Time (ACUTE ONLY) 0900  Mobility Specialist Stop Time (ACUTE ONLY) 0917  Mobility Specialist Time Calculation (min) (ACUTE ONLY) 17 min    Pt received in bed, agreeable to mobility session. C/o severe back pain that radiates down BLEs. SV for bed mobility. CGA for ambulation. Ambulated a few feet down the hallway before needing to sit in rollator d/t onset of pain. Could not complete return ambulation and requested to be rolled back to room. Once back in the room, pt completed a stand pivot to EOB with minG. Left in bed with all needs met, call bell in reach.   Ileana Lute Mobility Specialist Please contact via SecureChat or Rehab office at [phone]

## 2024-04-17 NOTE — Progress Notes (Signed)
 Progress Note   Patient: Corey Tate FMW:[id number] DOB: 1941-03-24 DOA: 04/15/2024     1 DOS: the patient was seen and examined on 04/17/2024   Brief hospital course: 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  He made appointment witj Neurosurgeron, Dr. Joshua in 2 weeks. In ED CTA C/A/P showed diffuse atherosclerotic disease but no dissection and no bony change to thoracic or lumbar spine.    Patient required 3 doses of IV opiates and still couldn't stand, so hospitalists asked to observe for pain control, PT eval.  Assessment and Plan: Intractable back pain 7-10 days increased low back pain, like my sciatica.  CT thoracolumbar spine multilevel degenerative disease noted, prior spinal instrumentation noted. Follows with Neurosurgery Dr. Joshua at [facility], has appointment in 1 week. Continue schedule tylenol , oxycodone  PRN, robaxin . PT/ OT eval for dc plan. May need NSGY eval prior to discharge.  Coronary artery disease involving native coronary artery of native heart with angina pectoris.  Peripheral vascular disease. Essential hypertension- BP improved. Continue amlodipine . Continue aspirin , Plavix , Zetia , pravastatin   OSA (obstructive sleep apnea) No CPAP  Cancer of upper lobe of right lung [facility]) Receiving radiation. Alk phos normal, CT ruled out osseos mets.    Paroxysmal atrial fibrillation (HCC) Not on anticoagulation, unclear reason. Currently sinus rhythm, continue telemetry.  COPD, severe (HCC) Chronic respiratory failure with hypoxia Uses home O2 PRN and at night.  No wheezing on exam. Continue home oxygen  2L. Continue Breztri. Continue PPI.     Out of bed to chair. Incentive spirometry. Nursing supportive care. Fall, aspiration precautions. Diet:  Diet Orders (From admission,  onward)     Start     Ordered   04/16/24 0038  Diet Heart Room service appropriate? Yes; Fluid consistency: Thin  Diet effective now       Question Answer Comment  Room service appropriate? Yes   Fluid consistency: Thin      04/16/24 0037           DVT prophylaxis: enoxaparin (LOVENOX) injection 40 mg Start: 04/16/24 1000  Level of care: Telemetry Medical   Code Status: Full Code  Subjective: Patient is seen and examined today morning. Still in distress with left sided back pain. Did work with PT. He is on 2L supplemental O2.  Physical Exam: Vitals:   04/17/24 0357 04/17/24 0803 04/17/24 0804 04/17/24 1146  BP: 138/73  112/69 (!) (P) 138/57  Pulse: 60 62 60 (P) 61  Resp: 18 18    Temp: 97.8 F (36.6 C)  97.8 F (36.6 C) (P) 97.9 F (36.6 C)  TempSrc: Oral  Oral (P) Oral  SpO2: 95% 94% 96% (P) 95%  Weight:      Height:        General - Elderly thin built Caucasian male, distress due to back pain HEENT - PERRLA, EOMI, atraumatic head, non tender sinuses. Lung - Clear, basal rales, no rhonchi, wheezes. Heart - S1, S2 heard, no murmurs, rubs, trace pedal edema. Abdomen - Soft, non tender, bowel sounds good Neuro - Alert, awake and oriented x 3, non focal exam. Skin - Warm and dry.  Data Reviewed:      Latest Ref Rng & Units 04/16/2024    1:38 AM 04/15/2024    4:28 PM 10/14/2022   10:44 AM  CBC  WBC 4.0 - 10.5 K/uL 7.9  8.7  8.0   Hemoglobin 13.0 - 17.0 g/dL 87.5  88.0  87.0   Hematocrit 39.0 - 52.0 % 36.6  35.7  37.6   Platelets 150 - 400 K/uL 178  197  178       Latest Ref Rng & Units 04/16/2024    1:38 AM 04/15/2024    4:28 PM 10/14/2022   10:44 AM  BMP  Glucose 70 - 99 mg/dL 83  897  856   BUN 8 - 23 mg/dL 12  15  18    Creatinine 0.61 - 1.24 mg/dL 9.09  8.99  8.80   Sodium 135 - 145 mmol/L 136  137  135   Potassium 3.5 - 5.1 mmol/L 3.8  4.1  3.9   Chloride 98 - 111 mmol/L 95  101  98   CO2 22 - 32 mmol/L 28  28  26    Calcium 8.9 - 10.3 mg/dL 9.2   9.2  9.8    CT L-SPINE NO CHARGE Result Date: 04/15/2024 CLINICAL DATA:  Flank and back pain radiating to the legs EXAM: CT Thoracic and Lumbar spine without contrast TECHNIQUE: Multiplanar CT images of the thoracic and lumbar spine were reconstructed from contemporary CT of the Chest, Abdomen, and Pelvis. RADIATION DOSE REDUCTION: This exam was performed according to the departmental dose-optimization program which includes automated exposure control, adjustment of the mA and/or kV according to patient size and/or use of iterative reconstruction technique. CONTRAST:  None or No additional COMPARISON:  Chest CT 02/03/2024, MRI 02/12/2023 FINDINGS: CT THORACIC SPINE FINDINGS Alignment: Normal. Vertebrae: No acute fracture or focal pathologic process. Paraspinal and other soft tissues: No acute finding Disc levels: Partial ankylosis T5-T6 disc space. Multilevel degenerative osteophyte. No high-grade canal stenosis. CT LUMBAR SPINE FINDINGS Segmentation: 5 lumbar type vertebrae. Alignment: Grade 1 anterolisthesis L4 on L5. Vertebrae: No acute fracture or focal pathologic process. Paraspinal and other soft tissues: No acute finding Disc levels: Posterior spinal instrumentation L3 through L5. Lucency about the pedicular screws at L5. At L1-L2, disc bulge, moderate severe facet arthropathy and moderate severe canal stenosis. No high-grade foraminal narrowing. At L2-L3, disc space narrowing. Diffuse disc bulge. Moderate severe facet degenerative changes. At least mild canal stenosis. Posterior decompression changes. At L3-L4, posterior decompression changes. Limited by hardware artifact. Disc bulge and advanced facet degenerative changes. Mild canal stenosis. At L4-L5, posterior decompression changes. Artifact from hardware limits assessment of the canal. At L5-S1, disc space narrowing. Moderate facet degenerative changes. No high-grade canal stenosis. Moderate severe bilateral foraminal narrowing. IMPRESSION: 1. No CT  evidence for acute osseous abnormality of the thoracic or lumbar spine. 2. Posterior spinal instrumentation L3 through L5. Lucency about the pedicular screws at L5 correlate for loosening. 3. Multilevel degenerative changes of the lumbar spine as described above. Electronically Signed   By: Luke Bun M.D.   On: 04/15/2024 20:25   CT T-SPINE NO CHARGE Result Date: 04/15/2024 CLINICAL DATA:  Flank and back pain radiating to the legs EXAM: CT Thoracic and Lumbar spine without contrast TECHNIQUE: Multiplanar CT images of the thoracic and lumbar spine were reconstructed from contemporary CT of the Chest, Abdomen, and Pelvis. RADIATION DOSE REDUCTION: This exam was performed according to the departmental dose-optimization program which includes automated exposure control, adjustment of the mA and/or kV according to patient size and/or use of iterative reconstruction technique. CONTRAST:  None or No additional COMPARISON:  Chest CT 02/03/2024, MRI 02/12/2023 FINDINGS: CT THORACIC  SPINE FINDINGS Alignment: Normal. Vertebrae: No acute fracture or focal pathologic process. Paraspinal and other soft tissues: No acute finding Disc levels: Partial ankylosis T5-T6 disc space. Multilevel degenerative osteophyte. No high-grade canal stenosis. CT LUMBAR SPINE FINDINGS Segmentation: 5 lumbar type vertebrae. Alignment: Grade 1 anterolisthesis L4 on L5. Vertebrae: No acute fracture or focal pathologic process. Paraspinal and other soft tissues: No acute finding Disc levels: Posterior spinal instrumentation L3 through L5. Lucency about the pedicular screws at L5. At L1-L2, disc bulge, moderate severe facet arthropathy and moderate severe canal stenosis. No high-grade foraminal narrowing. At L2-L3, disc space narrowing. Diffuse disc bulge. Moderate severe facet degenerative changes. At least mild canal stenosis. Posterior decompression changes. At L3-L4, posterior decompression changes. Limited by hardware artifact. Disc bulge  and advanced facet degenerative changes. Mild canal stenosis. At L4-L5, posterior decompression changes. Artifact from hardware limits assessment of the canal. At L5-S1, disc space narrowing. Moderate facet degenerative changes. No high-grade canal stenosis. Moderate severe bilateral foraminal narrowing. IMPRESSION: 1. No CT evidence for acute osseous abnormality of the thoracic or lumbar spine. 2. Posterior spinal instrumentation L3 through L5. Lucency about the pedicular screws at L5 correlate for loosening. 3. Multilevel degenerative changes of the lumbar spine as described above. Electronically Signed   By: Luke Bun M.D.   On: 04/15/2024 20:25   CT Angio Chest/Abd/Pel for Dissection W and/or Wo Contrast Result Date: 04/15/2024 CLINICAL DATA:  Knife-like back pain history of known AAA EXAM: CT ANGIOGRAPHY CHEST, ABDOMEN AND PELVIS TECHNIQUE: Non-contrast CT of the chest was initially obtained. Multidetector CT imaging through the chest, abdomen and pelvis was performed using the standard protocol during bolus administration of intravenous contrast. Multiplanar reconstructed images and MIPs were obtained and reviewed to evaluate the vascular anatomy. RADIATION DOSE REDUCTION: This exam was performed according to the departmental dose-optimization program which includes automated exposure control, adjustment of the mA and/or kV according to patient size and/or use of iterative reconstruction technique. CONTRAST:  75mL OMNIPAQUE  IOHEXOL  350 MG/ML SOLN COMPARISON:  Chest CT 02/03/2024, PET CT 08/27/2022, CT angiography 01/26/2024 FINDINGS: CTA CHEST FINDINGS Cardiovascular: Non contrasted images of the chest demonstrate no acute intramural hematoma. Advanced aortic atherosclerosis. No aneurysm or dissection. Multi vessel coronary vascular calcification. Normal cardiac size. No pericardial effusion. Mediastinum/Nodes: Patent trachea. No thyroid mass. No suspicious lymph nodes. Esophagus within normal limits.  Lungs/Pleura: Emphysema. Interim development of heterogeneous right lower lobe airspace disease and ground-glass density. Inferior right upper lobe irregular pulmonary nodule measuring about 8 x 5 mm on series 7, image 95, previously 9 x 5 mm. No new pulmonary nodule, pleural effusion, or pneumothorax. Debris within the right lower lobe bronchi. Bronchial wall thickening. Fibrosis and scarring medial left base Musculoskeletal: Sternum appears intact. No acute osseous abnormality. See separately dictated spine CT Review of the MIP images confirms the above findings. CTA ABDOMEN AND PELVIS FINDINGS VASCULAR Aorta: Atherosclerosis. No dissection. No occlusive disease. Infrarenal abdominal aortic aneurysm measuring 3.9 cm maximum, previously 3.8 cm. Prominent intraluminal thrombus. Negative for retroperitoneal hematoma or surrounding stranding. Celiac: Moderate stenosis at the origin. Diffuse calcific and noncalcific plaque. Distal flow enhancement present. No aneurysm SMA: Heavily calcified at the origin with at least moderate stenosis. Distal flow enhancement present. Renals: 2 right and single left renal arteries. Dominant superior right renal artery and the main left renal artery demonstrate heavy calcific disease with moderate severe stenosis of the proximal arteries. IMA: High-grade stenosis at the origin. Distal flow enhancement present Inflow: Negative for aneurysm or dissection.  Advanced atherosclerosis. Severe stenosis that the origin of the right internal iliac artery with probable short segment chronic occlusion. Severe diffuse disease of the external iliac vessels with at least moderate stenosis of the distal right external iliac artery. Stenotic appearing bilateral SFA origins. Veins: Suboptimally assessed Review of the MIP images confirms the above findings. NON-VASCULAR Hepatobiliary: No focal liver abnormality is seen. No gallstones, gallbladder wall thickening, or biliary dilatation. Pancreas:  Unremarkable. No pancreatic ductal dilatation or surrounding inflammatory changes. Spleen: Normal in size without focal abnormality. Adrenals/Urinary Tract: Thickened left adrenal gland without mass. Normal right adrenal gland. No hydronephrosis. Nonobstructing small left-sided kidney stone. The bladder is unremarkable Stomach/Bowel: The stomach is nonenlarged. There is no dilated small bowel. No acute bowel wall thickening Lymphatic: No suspicious lymph nodes Reproductive: Negative prostate partially obscured Other: Negative for ascites or free air Musculoskeletal: Right hip replacement with artifact. Hardware in the spine, see separately dictated spine CT. Review of the MIP images confirms the above findings. IMPRESSION: 1. Negative for acute aortic dissection. 2. Infrarenal abdominal aortic aneurysm measuring 3.9 cm maximum, previously 3.8 cm. Recommend follow-up ultrasound every 3 years. (Ref.: J Vasc Surg. 2018; 67:2-77 and J Am Coll Radiol 2013;10(10):789-794.) 3. Interim development of heterogeneous right lower lobe airspace disease and ground-glass density, suspect for pneumonia, possible aspiration. Debris within the right lower lobe bronchi. 4. Emphysema. Stable irregular right upper lobe pulmonary nodule. Reference chest CT 02/03/2024 5. Advanced aortic atherosclerosis. 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 At least moderate stenosis of the proximal renal arteries. 6. Nonobstructing left kidney stone. Aortic Atherosclerosis (ICD10-I70.0) and Emphysema (ICD10-J43.9). Electronically Signed   By: Luke Bun M.D.   On: 04/15/2024 20:08    Family Communication: Discussed with patient, understand and agree. All questions answered.  Disposition: Status is: inpatient because: severe back pain, pain control, PT/ OT  Planned Discharge Destination: Home with Home Health     Time spent: 44 minutes  Author: Concepcion Riser, MD 04/17/2024 3:04 PM Secure chat 7am to  7pm For on call review [email or web address]

## 2024-04-18 ENCOUNTER — Inpatient Hospital Stay (HOSPITAL_COMMUNITY)

## 2024-04-18 DIAGNOSIS — I25119 Atherosclerotic heart disease of native coronary artery with unspecified angina pectoris: Secondary | ICD-10-CM | POA: Diagnosis not present

## 2024-04-18 DIAGNOSIS — E43 Unspecified severe protein-calorie malnutrition: Secondary | ICD-10-CM | POA: Insufficient documentation

## 2024-04-18 DIAGNOSIS — M5440 Lumbago with sciatica, unspecified side: Secondary | ICD-10-CM | POA: Diagnosis not present

## 2024-04-18 DIAGNOSIS — C3411 Malignant neoplasm of upper lobe, right bronchus or lung: Secondary | ICD-10-CM | POA: Diagnosis not present

## 2024-04-18 DIAGNOSIS — I739 Peripheral vascular disease, unspecified: Secondary | ICD-10-CM | POA: Diagnosis not present

## 2024-04-18 MED ORDER — HYDROMORPHONE HCL 1 MG/ML IJ SOLN
0.5000 mg | INTRAMUSCULAR | Status: DC | PRN
  Administered 2024-04-18 – 2024-05-02 (×16): 0.5 mg via INTRAVENOUS
  Filled 2024-04-18 (×16): qty 1

## 2024-04-18 MED ORDER — DEXAMETHASONE SOD PHOSPHATE PF 10 MG/ML IJ SOLN
4.0000 mg | Freq: Four times a day (QID) | INTRAMUSCULAR | Status: AC
  Administered 2024-04-18 – 2024-04-19 (×4): 4 mg via INTRAVENOUS

## 2024-04-18 MED ORDER — ENSURE PLUS HIGH PROTEIN PO LIQD
237.0000 mL | Freq: Two times a day (BID) | ORAL | Status: DC
  Administered 2024-04-19 – 2024-05-03 (×25): 237 mL via ORAL

## 2024-04-18 NOTE — Progress Notes (Signed)
 Transition of Care [facility]) - Inpatient Brief Assessment   Patient Details  Name: Corey Tate MRN: [id number] Date of Birth: 01-11-1941  [facility]) CM/SW Contact:    Rosaline JONELLE Joe, RN Phone Number: 04/18/2024, 11:30 AM   Clinical Narrative: CM met with the patient but I was unable to obtain adequate history.. No family present at the bedside.  Patient lives with spouse - I called and spoke with the patient's daughter by phone and she states that patient lives with his wife at the home.  DME at the home includes home oxygen  at 2L/min [place], [place], [place] and 3:1.  Patient's daughter was offered Medicare choice regarding home health agency and daughter preferred [facility].  HH orders for PT, OT and aide placed to be co-signed by MD.  I called Arlean, CM at [facility] and she accepted the patient - pending staffing availability this week.  Patient is waiting on MRI.  No other IP Care management needs.  Patient to discharge home when stable.   Transition of Care Asessment: Insurance and Status: (P) Insurance coverage has been reviewed Patient has primary care physician: (P) Yes Home environment has been reviewed: (P) from home with spouse Prior level of function:: (P) self - RW Prior/Current Home Services: (P) No current home services (DME at the home includes home oxygen  at 2L/min [place] - pm use only, [place], [place] and 3:1) Social Drivers of Health Review: (P) SDOH reviewed interventions complete Readmission risk has been reviewed: (P) Yes Transition of care needs: (P) transition of care needs identified, TOC will continue to follow

## 2024-04-18 NOTE — Progress Notes (Signed)
 Progress Note   Patient: Corey Tate FMW:[id number] DOB: 1941-05-27 DOA: 04/15/2024     2 DOS: the patient was seen and examined on 04/18/2024   Brief hospital course: 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  He made appointment witj Neurosurgeron, Dr. Joshua in 2 weeks. In ED CTA C/A/P showed diffuse atherosclerotic disease but no dissection and no bony change to thoracic or lumbar spine.    Patient required 3 doses of IV opiates and still couldn't stand, so hospitalist asked to admit for better pain control, PT eval. Neurosurgery team consulted advised MRI lumbar spine.  Assessment and Plan: Intractable back pain CT thoracolumbar spine multilevel degenerative disease noted, prior spinal instrumentation noted. Dr. Joshua advised MRI lumbar spine for further evaluation. Continue schedule tylenol , oxycodone  PRN, robaxin . Will add dialudid for severe pain. PT/ OT for discharge planning.  Coronary artery disease involving native coronary artery of native heart with angina pectoris.  Peripheral vascular disease. Essential hypertension-Continue amlodipine . Continue aspirin , Plavix , Zetia , pravastatin   OSA (obstructive sleep apnea) No CPAP  Cancer of upper lobe of right lung [facility]) Receiving radiation. Alk phos normal, CT ruled out osseos mets.    Paroxysmal atrial fibrillation (HCC) Currently sinus rhythm, continue telemetry.  COPD, severe (HCC) Chronic respiratory failure with hypoxia Uses home O2 PRN and at night.  No wheezing on exam. Continue home oxygen  2L. Continue Breztri. Continue PPI.     Out of bed to chair. Incentive spirometry. Nursing supportive care. Fall, aspiration precautions. Diet:  Diet Orders (From admission, onward)     Start     Ordered   04/16/24 0038  Diet Heart Room service  appropriate? Yes; Fluid consistency: Thin  Diet effective now       Question Answer Comment  Room service appropriate? Yes   Fluid consistency: Thin      04/16/24 0037           DVT prophylaxis: enoxaparin (LOVENOX) injection 40 mg Start: 04/16/24 1000  Level of care: Telemetry Medical   Code Status: Full Code  Subjective: Patient is seen and examined today morning. Continues to have left sided back pain. Unable to get out of bed to eat. RN at bedside giving his meds.  Physical Exam: Vitals:   04/18/24 0521 04/18/24 0757 04/18/24 0806 04/18/24 1047  BP: (!) 113/52 124/63  124/63  Pulse: (!) 59 (!) 55    Resp: 18 16    Temp: 98 F (36.7 C) 97.6 F (36.4 C)    TempSrc: Oral Oral    SpO2: 97% 97% 97%   Weight:      Height:        General - Elderly thin built Caucasian male, distress due to back pain HEENT - PERRLA, EOMI, atraumatic head, non tender sinuses. Lung - Clear, basal rales, no rhonchi, wheezes. Heart - S1, S2 heard, no murmurs, rubs, trace pedal edema. Abdomen - Soft, non tender, bowel sounds good Neuro - Alert, awake and oriented x 3, non focal exam. Skin - Warm and dry.  Data Reviewed:      Latest Ref Rng & Units 04/16/2024    1:38 AM 04/15/2024    4:28 PM 10/14/2022   10:44 AM  CBC  WBC 4.0 - 10.5 K/uL 7.9  8.7  8.0   Hemoglobin 13.0 - 17.0  g/dL 87.5  88.0  87.0   Hematocrit 39.0 - 52.0 % 36.6  35.7  37.6   Platelets 150 - 400 K/uL 178  197  178       Latest Ref Rng & Units 04/16/2024    1:38 AM 04/15/2024    4:28 PM 10/14/2022   10:44 AM  BMP  Glucose 70 - 99 mg/dL 83  897  856   BUN 8 - 23 mg/dL 12  15  18    Creatinine 0.61 - 1.24 mg/dL 9.09  8.99  8.80   Sodium 135 - 145 mmol/L 136  137  135   Potassium 3.5 - 5.1 mmol/L 3.8  4.1  3.9   Chloride 98 - 111 mmol/L 95  101  98   CO2 22 - 32 mmol/L 28  28  26    Calcium 8.9 - 10.3 mg/dL 9.2  9.2  9.8    No results found.   Family Communication: Discussed with patient, understand and agree.  All questions answered.  Disposition: Status is: inpatient because: severe back pain, nsgy consult, pain control, PT/ OT  Planned Discharge Destination: Home with Home Health     Time spent: 45 minutes  Author: Concepcion Riser, MD 04/18/2024 11:52 AM Secure chat 7am to 7pm For on call review [email or web address]

## 2024-04-18 NOTE — Consult Note (Signed)
 Reason for Consult:lumbar radiculopathy Referring Physician: hospitalist  Corey Tate is an 83 y.o. male.   HPI:  83 year old male presented to the hospital with uncontrolled back pain. He states that this started about a week ago when it got significantly worse. He was schedule for an appt in our office next week but could not wait that long. He has a very extensive medical history. He complains of back pain that radiates into his left groin, down his leg all over. He endorses weakness in this leg too. Has difficulty sitting or standing because of the pain. Has a history of a lumbar posteiror lateral fusion L3-L5.  Past Medical History:  Diagnosis Date   Abdominal aortic aneurysm (AAA) without rupture 05/28/2023   Abnormal cardiac CT angiography 01/31/2020   Anxiety 12/11/2017   Arthritis    Asthma    ?   Atherosclerosis of native artery of both lower extremities 01/31/2016   Atrial flutter by electrocardiogram (HCC) 12/10/2016   Bilateral carotid artery stenosis 01/31/2016   CAD (coronary artery disease)    a. 01/2020- DFR-guided CSI orbital atherectomy with DES PCI of proximal LAD with staged orbital atherectomy and DES PCI of the mid and distal LCx.   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) 05/28/2023   Carotid bruit 12/11/2017   Chronic anticoagulation 12/31/2017   Chronic back pain greater than 3 months duration 10/02/2014   CKD (chronic kidney disease), stage II 02/08/2020   COPD, severe (HCC) 03/04/2016   Coronary artery disease involving native coronary artery of native heart with angina pectoris 01/31/2020   Cough 03/04/2016   Degenerative arthritis of hip 04/02/2012   DOE (dyspnea on exertion)  - as Angina Equivalent 01/31/2020   Dyslipidemia 09/26/2015   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   GERD (gastroesophageal reflux disease)    H/O varicella 12/11/2017   History of atrial fibrillation 12/10/2016   HTN (hypertension)  02/08/2020   Hypertension    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   Lumbar spondylosis 11/06/2014   Neck mass 07/15/2022   Nicotine dependence, uncomplicated 09/26/2015   Nocturnal hypoxemia 03/04/2016   PAF (paroxysmal atrial fibrillation) (HCC) 12/10/2016   Peripheral vascular disease    Pneumonia 2021   S/P lumbar fusion 10/20/2022   S/P lumbar laminectomy 10/21/2022   Sebaceous cyst 07/15/2022   Shortness of breath    USES OXYGEN  AT NIGHT--HX OF RIGHT LOWER LOBE PULMONARY NODULE--FOLLOWED BY PT'S MEDICAL DOCTOR AND HAS HAD FOR YEARS   Smoking greater than 40 pack years 03/04/2016   Spinal stenosis of lumbar region 10/02/2014   Spondylolisthesis 10/16/2014   Syncope 08/04/2016    Past Surgical History:  Procedure Laterality Date   ATHERECTOMY  01/31/2020   Successful DFR guided, CSI Orbital Atherectomy-DES PCI of proximal LA   BACK SURGERY     LOWER BACK SURGERY X 3 - FUSION   CARPAL TUNNEL RELEASE AND SURGERY LEFT ELBOW     CATARACT EXTRACTION     CERVICAL DISC SURGERY     FUSION - ONLY SLIGHT LIMITATION IN NECK MOVEMENT   CORONARY ATHERECTOMY N/A 01/31/2020   Procedure: CORONARY ATHERECTOMY;  Surgeon: Anner Alm LELON, MD;  Location: [facility] INVASIVE CV LAB;  Service: Cardiovascular;  Laterality: N/A;   CORONARY ATHERECTOMY N/A 02/08/2020   Procedure: CORONARY ATHERECTOMY;  Surgeon: Anner Alm LELON, MD;  Location: [facility] INVASIVE CV LAB;  Service: Cardiovascular;  Laterality: N/A;   CORONARY PRESSURE/FFR STUDY N/A  01/31/2020   Procedure: INTRAVASCULAR PRESSURE WIRE/FFR STUDY;  Surgeon: Anner Alm ORN, MD;  Location: [facility] INVASIVE CV LAB;  Service: Cardiovascular;  Laterality: N/A;   CORONARY STENT INTERVENTION N/A 01/31/2020   Procedure: CORONARY STENT INTERVENTION;  Surgeon: Anner Alm ORN, MD;  Location: [facility] INVASIVE CV LAB;  Service: Cardiovascular;  Laterality: N/A;   LAMINECTOMY WITH POSTERIOR LATERAL ARTHRODESIS LEVEL 3 Bilateral 10/20/2022   Procedure: Laminectomy and  Foraminotomy - Lumbar two-Lumbar three - bilateral - Lumbar three-Lumbar four - right - Lumbar four-Lumbar five - right, posterolateral fusion Lumbar three-five with pedicle screw fixation Lumbar three-five;  Surgeon: Joshua Alm RAMAN, MD;  Location: [facility] OR;  Service: Neurosurgery;  Laterality: Bilateral;   LEFT HEART CATH N/A 02/08/2020   Procedure: Left Heart Cath;  Surgeon: Anner Alm ORN, MD;  Location: [facility] INVASIVE CV LAB;  Service: Cardiovascular;  Laterality: N/A;   LEFT HEART CATH AND CORONARY ANGIOGRAPHY N/A 01/31/2020   Procedure: LEFT HEART CATH AND CORONARY ANGIOGRAPHY;  Surgeon: Anner Alm ORN, MD;  Location: [facility] INVASIVE CV LAB;  Service: Cardiovascular;  Laterality: N/A;   POSTERIOR CERVICAL FUSION/FORAMINOTOMY Bilateral 09/30/2012   Procedure: CERVICAL SEVEN AND THORACIC ONE BILATERAL POSTERIOR CERVICAL FUSION/FORAMINOTOMY ;  Surgeon: Alm RAMAN Joshua, MD;  Location: [facility] NEURO ORS;  Service: Neurosurgery;  Laterality: Bilateral;   RIGHT SHOULDER SURGERY     SINUS SURGERY WITH INSTATRAK     TOTAL HIP ARTHROPLASTY  04/02/2012   Procedure: TOTAL HIP ARTHROPLASTY ANTERIOR APPROACH;  Surgeon: Lonni CINDERELLA Poli, MD;  Location: [facility] ORS;  Service: Orthopedics;  Laterality: Right;  Right Total Hip Arthroplasty   VASCULAR SURGERY     STENT PLACEMENT RIGHT LEG AND ROTOR ROOTER LEFT LEG    Allergies  Allergen Reactions   Ferra-Caps [Iron] Hives   Streptomycin Hives and Itching    Social History   Tobacco Use   Smoking status: Some Days    Current packs/day: 1.00    Average packs/day: 1 pack/day for 55.0 years (55.0 ttl pk-yrs)    Types: Cigarettes   Smokeless tobacco: Never  Substance Use Topics   Alcohol use: Yes    Alcohol/week: 21.0 standard drinks of alcohol    Types: 21 Cans of beer per week    Comment: 3 BEERS A DAY    Family History  Problem Relation Age of Onset   Cancer Father    Diabetes Sister    Diabetes Brother      Review of Systems  Positive ROS: as  above  All other systems have been reviewed and were otherwise negative with the exception of those mentioned in the HPI and as above.  Objective: Vital signs in last 24 hours: Temp:  [97.6 F (36.4 C)-98.2 F (36.8 C)] 98.2 F (36.8 C) (10/27 1234) Pulse Rate:  [55-60] 60 (10/27 1234) Resp:  [16-20] 16 (10/27 1234) BP: (103-170)/(52-85) 103/60 (10/27 1234) SpO2:  [90 %-97 %] 90 % (10/27 1234) FiO2 (%):  [28 %] 28 % (10/26 2139)  General Appearance: Alert, cooperative, no distress, appears stated age Head: Normocephalic, without obvious abnormality, atraumatic Eyes: PERRL, conjunctiva/corneas clear, EOM's intact, fundi benign, both eyes      Lungs: respirations unlabored Heart: Regular rate and rhythm Extremities: Extremities normal, atraumatic, no cyanosis or edema Pulses: 2+ and symmetric all extremities Skin: Skin color, texture, turgor normal, no rashes or lesions  NEUROLOGIC:   Mental status: A&O x4, no aphasia, good attention span, Memory and fund of knowledge Motor Exam - grossly normal, normal tone and  bulk but pain limited exam Sensory Exam - grossly normal Reflexes: symmetric, no pathologic reflexes, No Hoffman's, No clonus Coordination - grossly normal Gait - not tested Balance - not tested Cranial Nerves: I: smell Not tested  II: visual acuity  OS: na    OD: na  II: visual fields Full to confrontation  II: pupils Equal, round, reactive to light  III,VII: ptosis None  III,IV,VI: extraocular muscles  Full ROM  V: mastication   V: facial light touch sensation    V,VII: corneal reflex    VII: facial muscle function - upper    VII: facial muscle function - lower   VIII: hearing   IX: soft palate elevation    IX,X: gag reflex   XI: trapezius strength    XI: sternocleidomastoid strength   XI: neck flexion strength    XII: tongue strength      Data Review Lab Results  Component Value Date   WBC 7.9 04/16/2024   HGB 12.4 (L) 04/16/2024   HCT 36.6 (L)  04/16/2024   MCV 97.1 04/16/2024   PLT 178 04/16/2024   Lab Results  Component Value Date   NA 136 04/16/2024   K 3.8 04/16/2024   CL 95 (L) 04/16/2024   CO2 28 04/16/2024   BUN 12 04/16/2024   CREATININE 0.90 04/16/2024   GLUCOSE 83 04/16/2024   Lab Results  Component Value Date   INR 1.0 10/14/2022    Radiology: CT L-SPINE NO CHARGE Result Date: 04/15/2024 CLINICAL DATA:  Flank and back pain radiating to the legs EXAM: CT Thoracic and Lumbar spine without contrast TECHNIQUE: Multiplanar CT images of the thoracic and lumbar spine were reconstructed from contemporary CT of the Chest, Abdomen, and Pelvis. RADIATION DOSE REDUCTION: This exam was performed according to the departmental dose-optimization program which includes automated exposure control, adjustment of the mA and/or kV according to patient size and/or use of iterative reconstruction technique. CONTRAST:  None or No additional COMPARISON:  Chest CT 02/03/2024, MRI 02/12/2023 FINDINGS: CT THORACIC SPINE FINDINGS Alignment: Normal. Vertebrae: No acute fracture or focal pathologic process. Paraspinal and other soft tissues: No acute finding Disc levels: Partial ankylosis T5-T6 disc space. Multilevel degenerative osteophyte. No high-grade canal stenosis. CT LUMBAR SPINE FINDINGS Segmentation: 5 lumbar type vertebrae. Alignment: Grade 1 anterolisthesis L4 on L5. Vertebrae: No acute fracture or focal pathologic process. Paraspinal and other soft tissues: No acute finding Disc levels: Posterior spinal instrumentation L3 through L5. Lucency about the pedicular screws at L5. At L1-L2, disc bulge, moderate severe facet arthropathy and moderate severe canal stenosis. No high-grade foraminal narrowing. At L2-L3, disc space narrowing. Diffuse disc bulge. Moderate severe facet degenerative changes. At least mild canal stenosis. Posterior decompression changes. At L3-L4, posterior decompression changes. Limited by hardware artifact. Disc bulge and  advanced facet degenerative changes. Mild canal stenosis. At L4-L5, posterior decompression changes. Artifact from hardware limits assessment of the canal. At L5-S1, disc space narrowing. Moderate facet degenerative changes. No high-grade canal stenosis. Moderate severe bilateral foraminal narrowing. IMPRESSION: 1. No CT evidence for acute osseous abnormality of the thoracic or lumbar spine. 2. Posterior spinal instrumentation L3 through L5. Lucency about the pedicular screws at L5 correlate for loosening. 3. Multilevel degenerative changes of the lumbar spine as described above. Electronically Signed   By: Luke Bun M.D.   On: 04/15/2024 20:25   CT T-SPINE NO CHARGE Result Date: 04/15/2024 CLINICAL DATA:  Flank and back pain radiating to the legs EXAM: CT Thoracic and Lumbar spine  without contrast TECHNIQUE: Multiplanar CT images of the thoracic and lumbar spine were reconstructed from contemporary CT of the Chest, Abdomen, and Pelvis. RADIATION DOSE REDUCTION: This exam was performed according to the departmental dose-optimization program which includes automated exposure control, adjustment of the mA and/or kV according to patient size and/or use of iterative reconstruction technique. CONTRAST:  None or No additional COMPARISON:  Chest CT 02/03/2024, MRI 02/12/2023 FINDINGS: CT THORACIC SPINE FINDINGS Alignment: Normal. Vertebrae: No acute fracture or focal pathologic process. Paraspinal and other soft tissues: No acute finding Disc levels: Partial ankylosis T5-T6 disc space. Multilevel degenerative osteophyte. No high-grade canal stenosis. CT LUMBAR SPINE FINDINGS Segmentation: 5 lumbar type vertebrae. Alignment: Grade 1 anterolisthesis L4 on L5. Vertebrae: No acute fracture or focal pathologic process. Paraspinal and other soft tissues: No acute finding Disc levels: Posterior spinal instrumentation L3 through L5. Lucency about the pedicular screws at L5. At L1-L2, disc bulge, moderate severe facet  arthropathy and moderate severe canal stenosis. No high-grade foraminal narrowing. At L2-L3, disc space narrowing. Diffuse disc bulge. Moderate severe facet degenerative changes. At least mild canal stenosis. Posterior decompression changes. At L3-L4, posterior decompression changes. Limited by hardware artifact. Disc bulge and advanced facet degenerative changes. Mild canal stenosis. At L4-L5, posterior decompression changes. Artifact from hardware limits assessment of the canal. At L5-S1, disc space narrowing. Moderate facet degenerative changes. No high-grade canal stenosis. Moderate severe bilateral foraminal narrowing. IMPRESSION: 1. No CT evidence for acute osseous abnormality of the thoracic or lumbar spine. 2. Posterior spinal instrumentation L3 through L5. Lucency about the pedicular screws at L5 correlate for loosening. 3. Multilevel degenerative changes of the lumbar spine as described above. Electronically Signed   By: Luke Bun M.D.   On: 04/15/2024 20:25   CT Angio Chest/Abd/Pel for Dissection W and/or Wo Contrast Result Date: 04/15/2024 CLINICAL DATA:  Knife-like back pain history of known AAA EXAM: CT ANGIOGRAPHY CHEST, ABDOMEN AND PELVIS TECHNIQUE: Non-contrast CT of the chest was initially obtained. Multidetector CT imaging through the chest, abdomen and pelvis was performed using the standard protocol during bolus administration of intravenous contrast. Multiplanar reconstructed images and MIPs were obtained and reviewed to evaluate the vascular anatomy. RADIATION DOSE REDUCTION: This exam was performed according to the departmental dose-optimization program which includes automated exposure control, adjustment of the mA and/or kV according to patient size and/or use of iterative reconstruction technique. CONTRAST:  75mL OMNIPAQUE  IOHEXOL  350 MG/ML SOLN COMPARISON:  Chest CT 02/03/2024, PET CT 08/27/2022, CT angiography 01/26/2024 FINDINGS: CTA CHEST FINDINGS Cardiovascular: Non contrasted  images of the chest demonstrate no acute intramural hematoma. Advanced aortic atherosclerosis. No aneurysm or dissection. Multi vessel coronary vascular calcification. Normal cardiac size. No pericardial effusion. Mediastinum/Nodes: Patent trachea. No thyroid mass. No suspicious lymph nodes. Esophagus within normal limits. Lungs/Pleura: Emphysema. Interim development of heterogeneous right lower lobe airspace disease and ground-glass density. Inferior right upper lobe irregular pulmonary nodule measuring about 8 x 5 mm on series 7, image 95, previously 9 x 5 mm. No new pulmonary nodule, pleural effusion, or pneumothorax. Debris within the right lower lobe bronchi. Bronchial wall thickening. Fibrosis and scarring medial left base Musculoskeletal: Sternum appears intact. No acute osseous abnormality. See separately dictated spine CT Review of the MIP images confirms the above findings. CTA ABDOMEN AND PELVIS FINDINGS VASCULAR Aorta: Atherosclerosis. No dissection. No occlusive disease. Infrarenal abdominal aortic aneurysm measuring 3.9 cm maximum, previously 3.8 cm. Prominent intraluminal thrombus. Negative for retroperitoneal hematoma or surrounding stranding. Celiac: Moderate stenosis at the origin.  Diffuse calcific and noncalcific plaque. Distal flow enhancement present. No aneurysm SMA: Heavily calcified at the origin with at least moderate stenosis. Distal flow enhancement present. Renals: 2 right and single left renal arteries. Dominant superior right renal artery and the main left renal artery demonstrate heavy calcific disease with moderate severe stenosis of the proximal arteries. IMA: High-grade stenosis at the origin. Distal flow enhancement present Inflow: Negative for aneurysm or dissection. Advanced atherosclerosis. Severe stenosis that the origin of the right internal iliac artery with probable short segment chronic occlusion. Severe diffuse disease of the external iliac vessels with at least moderate  stenosis of the distal right external iliac artery. Stenotic appearing bilateral SFA origins. Veins: Suboptimally assessed Review of the MIP images confirms the above findings. NON-VASCULAR Hepatobiliary: No focal liver abnormality is seen. No gallstones, gallbladder wall thickening, or biliary dilatation. Pancreas: Unremarkable. No pancreatic ductal dilatation or surrounding inflammatory changes. Spleen: Normal in size without focal abnormality. Adrenals/Urinary Tract: Thickened left adrenal gland without mass. Normal right adrenal gland. No hydronephrosis. Nonobstructing small left-sided kidney stone. The bladder is unremarkable Stomach/Bowel: The stomach is nonenlarged. There is no dilated small bowel. No acute bowel wall thickening Lymphatic: No suspicious lymph nodes Reproductive: Negative prostate partially obscured Other: Negative for ascites or free air Musculoskeletal: Right hip replacement with artifact. Hardware in the spine, see separately dictated spine CT. Review of the MIP images confirms the above findings. IMPRESSION: 1. Negative for acute aortic dissection. 2. Infrarenal abdominal aortic aneurysm measuring 3.9 cm maximum, previously 3.8 cm. Recommend follow-up ultrasound every 3 years. (Ref.: J Vasc Surg. 2018; 67:2-77 and J Am Coll Radiol 2013;10(10):789-794.) 3. Interim development of heterogeneous right lower lobe airspace disease and ground-glass density, suspect for pneumonia, possible aspiration. Debris within the right lower lobe bronchi. 4. Emphysema. Stable irregular right upper lobe pulmonary nodule. Reference chest CT 02/03/2024 5. Advanced aortic atherosclerosis. 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 At least moderate stenosis of the proximal renal arteries. 6. Nonobstructing left kidney stone. Aortic Atherosclerosis (ICD10-I70.0) and Emphysema (ICD10-J43.9). Electronically Signed   By: Luke Bun M.D.   On: 04/15/2024 20:08     Assessment/Plan: 83 year  old male presented to the hospital with intractable back pain. MRI shows a new large disc herniation at L4-5 that could correlate with is his symptoms. Differential diagnosis would also include left hip pathology as he does have a positive fabers test. Recommend a left transforaminal L4-5 epidural steroid injection to help with his pain. Continue therapy as tolerated.    Suzen Lacks Erikka Follmer 04/18/2024 2:39 PM

## 2024-04-18 NOTE — Progress Notes (Signed)
 Initial Nutrition Assessment  DOCUMENTATION CODES:   Severe malnutrition in context of chronic illness  INTERVENTION:  Liberalize diet to regular to encourage PO intake  Ensure Plus High Protein po BID, each supplement provides 350 kcal and 20 grams of protein. Refeeding risk, monitor K+, Phos and Mg x 3 days. Replete as needed.    NUTRITION DIAGNOSIS:   Moderate Malnutrition related to chronic illness as evidenced by energy intake < or equal to 75% for > or equal to 1 month, moderate fat depletion, severe muscle depletion.   GOAL:   Patient will meet greater than or equal to 90% of their needs   MONITOR:   PO intake, Supplement acceptance  REASON FOR ASSESSMENT:   Consult Diet education, Poor PO  ASSESSMENT:   PMHx:HTN, severe COPD, PVD s/p left CEA and right EIA angioplasty, active smoker, hx lung CA on radiation, OSA on 2L home O2 no CPAP. Presented with severe unrelenting back pain.  Pt seen in room, lying on his side in bed. Pt report chronic back pain that significantly worsened over the past week. Pt reports that that his PO intake has been declining for the past year, will typically skip breakfast and not eat lunch until the afternoon. Pt will eat an egg salad sandwich and a cookie around 2 pm before having dinner around 7 pm (typically a casserole). Pt has never tired Ensure before and is wiling to try this admission. Pt reports hsi most recent weight has been around 140 lbs but does believe he has been losing weight over the past year.  Weight has been stable in chart hx for the past several months, however patient with fat loss and muscle wasting present on exam.   Admit weight: 62.5 kg  Current weight: 62.5 kg  Nutritionally Relevant Medications: Colace, protonix , miralax   Labs Reviewed.    NUTRITION - FOCUSED PHYSICAL EXAM:  Flowsheet Row Most Recent Value  Orbital Region Moderate depletion  Upper Arm Region Severe depletion  Thoracic and Lumbar Region  Moderate depletion  Buccal Region Moderate depletion  Temple Region Severe depletion  Clavicle Bone Region Severe depletion  Clavicle and Acromion Bone Region Severe depletion  Scapular Bone Region Moderate depletion  Dorsal Hand Moderate depletion  Patellar Region Severe depletion  Anterior Thigh Region Severe depletion  Posterior Calf Region Moderate depletion  Hair Reviewed  Eyes Reviewed  Mouth Reviewed  Skin Reviewed  Nails Reviewed    Diet Order:   Diet Order             Diet regular Room service appropriate? Yes; Fluid consistency: Thin  Diet effective now                   EDUCATION NEEDS:   No education needs have been identified at this time  Skin:  Skin Assessment: Reviewed RN Assessment  Last BM:  PTA  Height:   Ht Readings from Last 1 Encounters:  04/16/24 5' 10.98 (1.803 m)    Weight:   Wt Readings from Last 1 Encounters:  04/16/24 62.5 kg     BMI:  Body mass index is 19.23 kg/m.  Estimated Nutritional Needs:   Kcal:  1875-2200 kcal  Protein:  75-95 grams  Fluid:  1.8-2.2L/d  Madalyn Potters, MS, RD, LDN Clinical Dietitian  Contact via secure chat. If unavailable, use group chat RD Inpatient.

## 2024-04-18 NOTE — Progress Notes (Signed)
 Patient ID: Corey Tate, male   DOB: Dec 06, 1940, 83 y.o.   MRN: [id number] We were consulted on this patient who is well-known to me after having two-level decompression and instrumented fusion 04/24.  He had sudden onset of back pain 2 to 3 days ago.  He was admitted for pain control.  CT has been reviewed.  I tried to call the consulting physician back but there was no answer on his cell phone.  CT may suggest pseudoarthrosis at L4-5 but I doubt this is the cause of acute severe back pain.  I think he probably has adjacent level stenosis at L2-3 and maybe at L1-2.  I am recommending MRI of the lumbar spine and I have ordered that.  Will see the patient and leave more detailed note later today between operative cases.

## 2024-04-19 DIAGNOSIS — C3411 Malignant neoplasm of upper lobe, right bronchus or lung: Secondary | ICD-10-CM | POA: Diagnosis not present

## 2024-04-19 DIAGNOSIS — I25119 Atherosclerotic heart disease of native coronary artery with unspecified angina pectoris: Secondary | ICD-10-CM | POA: Diagnosis not present

## 2024-04-19 DIAGNOSIS — M5440 Lumbago with sciatica, unspecified side: Secondary | ICD-10-CM | POA: Diagnosis not present

## 2024-04-19 DIAGNOSIS — I70203 Unspecified atherosclerosis of native arteries of extremities, bilateral legs: Secondary | ICD-10-CM | POA: Diagnosis not present

## 2024-04-19 DIAGNOSIS — M48061 Spinal stenosis, lumbar region without neurogenic claudication: Secondary | ICD-10-CM

## 2024-04-19 MED ORDER — SODIUM CHLORIDE 0.9 % IV SOLN
12.5000 mg | Freq: Three times a day (TID) | INTRAVENOUS | Status: DC | PRN
  Administered 2024-04-30: 12.5 mg via INTRAVENOUS
  Filled 2024-04-19 (×2): qty 0.5
  Filled 2024-04-19: qty 12.5

## 2024-04-19 MED ORDER — DEXAMETHASONE SOD PHOSPHATE PF 10 MG/ML IJ SOLN
4.0000 mg | Freq: Three times a day (TID) | INTRAMUSCULAR | Status: AC
  Administered 2024-04-19 – 2024-04-20 (×3): 4 mg via INTRAVENOUS

## 2024-04-19 MED ORDER — SENNA 8.6 MG PO TABS
2.0000 | ORAL_TABLET | Freq: Every day | ORAL | Status: DC
  Administered 2024-04-19 – 2024-05-02 (×12): 17.2 mg via ORAL
  Filled 2024-04-19 (×13): qty 2

## 2024-04-19 MED ORDER — SODIUM CHLORIDE 0.9 % IV SOLN
12.5000 mg | Freq: Three times a day (TID) | INTRAVENOUS | Status: DC | PRN
  Administered 2024-04-19: 12.5 mg via INTRAVENOUS
  Filled 2024-04-19: qty 12.5

## 2024-04-19 MED ORDER — ONDANSETRON HCL 4 MG/2ML IJ SOLN
4.0000 mg | Freq: Three times a day (TID) | INTRAMUSCULAR | Status: DC | PRN
  Administered 2024-04-27 – 2024-05-01 (×3): 4 mg via INTRAVENOUS
  Filled 2024-04-19 (×3): qty 2

## 2024-04-19 NOTE — Progress Notes (Signed)
 Physical Therapy Treatment Patient Details Name: Corey Tate MRN: [id number] DOB: 29-Dec-1940 Today's Date: 04/19/2024   History of Present Illness 83 yo admitted 10/24 with unrelenting back pain.  PMH is significant for HTN, severe COPD, PVD, s/p CEA and R EIA angioplasty, active smoker, hx of lung Ca on radiation, OSA on 2L O2 no CPAP, CKD,  6 previous back surgeries, RTHA and R TSA    PT Comments  Patient with less activity tolerance today due to pain in L side low back radiating to LE.  Able to complete some LE/core exercises in bed though that even exacerbated his pain.  Performed sit<>stand x 2 with RW and CGA though not able to tolerate more today.  Patient relates no BM since last Wednesday and now with some vomiting episodes he related to indigestion.  RNCM to inform MD.  PT will continue to follow.  Still mobilizing well though not enough for home.  If pain improves could still go home with HHPT though if not may need to consider STSNF.  PT will continue to follow.     If plan is discharge home, recommend the following: A little help with walking and/or transfers;A little help with bathing/dressing/bathroom;Assistance with cooking/housework;Help with stairs or ramp for entrance;Assist for transportation   Can travel by private vehicle        Equipment Recommendations  None recommended by PT    Recommendations for Other Services       Precautions / Restrictions Precautions Precautions: Fall Recall of Precautions/Restrictions: Intact Precaution/Restrictions Comments: 2 L O2     Mobility  Bed Mobility Overal bed mobility: Modified Independent             General bed mobility comments: no physical help needed, using rails for side to sit    Transfers Overall transfer level: Needs assistance Equipment used: Rolling walker (2 wheels) Transfers: Sit to/from Stand Sit to Stand: Contact guard assist           General transfer comment: stood x 2 to RW for LE  strengthening, limited by pain    Ambulation/Gait               General Gait Details: declined ambulation due to increased pain in standing   Stairs             Wheelchair Mobility     Tilt Bed    Modified Rankin (Stroke Patients Only)       Balance Overall balance assessment: Needs assistance   Sitting balance-Leahy Scale: Good     Standing balance support: Bilateral upper extremity supported Standing balance-Leahy Scale: Poor Standing balance comment: reliant on UE support with flexed posture                            Communication Communication Communication: Impaired Factors Affecting Communication: Hearing impaired;Reduced clarity of speech  Cognition Arousal: Alert Behavior During Therapy: WFL for tasks assessed/performed   PT - Cognitive impairments: No apparent impairments                         Following commands: Intact      Cueing Cueing Techniques: Verbal cues  Exercises Other Exercises Other Exercises: heel slides x 5 on each leg in supine Other Exercises: hooklying hip flexion x 5 on each leg in supine Other Exercises: hooklying lateral trunk rotation x 5    General Comments General comments (skin integrity, edema,  etc.): Assisted to make donut pillow out of rolled towel in pillow case with pt relating has a place on his ear that hurts more with pressure.  Patient related had not had bowel movement since last Wednesday and now with indigestion and some vomiting at times, takes a pill for indigestion at home.      Pertinent Vitals/Pain Pain Assessment Faces Pain Scale: Hurts whole lot Pain Location: back radiating to L LE Pain Descriptors / Indicators: Aching, Discomfort, Radiating, Numbness Pain Intervention(s): Monitored during session, Repositioned, Limited activity within patient's tolerance    Home Living                          Prior Function            PT Goals (current goals can  now be found in the care plan section) Progress towards PT goals: Not progressing toward goals - comment    Frequency    Min 2X/week      PT Plan      Co-evaluation              AM-PAC PT 6 Clicks Mobility   Outcome Measure  Help needed turning from your back to your side while in a flat bed without using bedrails?: None Help needed moving from lying on your back to sitting on the side of a flat bed without using bedrails?: None Help needed moving to and from a bed to a chair (including a wheelchair)?: A Little Help needed standing up from a chair using your arms (e.g., wheelchair or bedside chair)?: A Little Help needed to walk in hospital room?: Total Help needed climbing 3-5 steps with a railing? : Total 6 Click Score: 16    End of Session   Activity Tolerance: Patient limited by pain Patient left: in bed;with call bell/phone within reach Nurse Communication: Other (comment) (RNCM in to speak with pt and informed her about no BM in almost a week) PT Visit Diagnosis: Other abnormalities of gait and mobility (R26.89);Muscle weakness (generalized) (M62.81);Difficulty in walking, not elsewhere classified (R26.2);Pain Pain - part of body:  (back)     Time: 8464-8396 PT Time Calculation (min) (ACUTE ONLY): 28 min  Charges:    $Therapeutic Exercise: 8-22 mins $Therapeutic Activity: 8-22 mins PT General Charges $$ ACUTE PT VISIT: 1 Visit                     Corey Tate, PT Acute Rehabilitation Services Office:[phone] 04/19/2024    Corey Tate 04/19/2024, 4:24 PM

## 2024-04-19 NOTE — Progress Notes (Signed)
 Soap Suds enema given. Pt is unable to retain the enema. Noted to have stool colored water  expelled on pad and towel. Assisted to bedside commode and noted to have 500 ml of mixed urine and stool colored liquid. No noted formed stool at this time. Assisted back to bed.

## 2024-04-19 NOTE — Plan of Care (Signed)
  Problem: Nutrition: Goal: Adequate nutrition will be maintained Outcome: Progressing   Problem: Coping: Goal: Level of anxiety will decrease Outcome: Progressing   Problem: Activity: Goal: Ability to tolerate increased activity will improve Outcome: Progressing

## 2024-04-19 NOTE — Progress Notes (Addendum)
 Progress Note   Patient: Corey Tate FMW:[id number] DOB: [birth year] DOA: 04/15/2024     3 DOS: the patient was seen and examined on 04/19/2024   Brief hospital course: 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  He made appointment witj Neurosurgeron, Dr. Joshua in 2 weeks. In ED CTA C/A/P showed diffuse atherosclerotic disease but no dissection and no bony change to thoracic or lumbar spine.    Patient required 3 doses of IV opiates and still couldn't stand, so hospitalist asked to admit for better pain control, PT eval. Neurosurgery team consulted advised MRI lumbar spine.  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 Still in lot of pain, unable to get out of bed. CT thoracolumbar spine multilevel degenerative disease noted, prior spinal instrumentation noted. Dr. Joshua reviewed MRI lumbar spine advised pain control, steroids, if pain not controlled plan for left L4-5 transforaminal epidural steroid injection. Continue tylenol , oxycodone , dilaudid  PRN, robaxin . Ordered decadron  4mg  q8hrs for 1 day. PT/ OT for discharge planning.  Coronary artery disease involving native coronary artery of native heart with angina pectoris.  Peripheral vascular disease. Essential hypertension-Continue amlodipine . 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 Alk phos normal, CT ruled out osseos mets.    Paroxysmal atrial fibrillation (HCC) Currently sinus rhythm, continue telemetry.  COPD, severe (HCC) Chronic respiratory failure with hypoxia Uses home O2 PRN and at night.  No wheezing on exam. Continue home oxygen  2L. Continue Breztri. Continue PPI.     Out of bed to chair.  Incentive spirometry. Nursing supportive care. Fall, aspiration precautions. Diet:  Diet Orders (From admission, onward)     Start     Ordered   04/18/24 1626  Diet regular Room service appropriate? Yes; Fluid consistency: Thin  Diet effective now       Question Answer Comment  Room service appropriate? Yes   Fluid consistency: Thin      04/18/24 1626           DVT prophylaxis: enoxaparin (LOVENOX) injection 40 mg Start: 04/16/24 1000  Level of care: Telemetry Medical   Code Status: Full Code  Subjective: Patient is seen and examined today morning. Persistent left sided back pain more with movement. Unable to get out of bed to eat. Asks for more pain meds.  Physical Exam: Vitals:   04/18/24 2332 04/19/24 0443 04/19/24 0802 04/19/24 1106  BP: (!) 151/75 116/63 (!) 157/65 (!) 95/52  Pulse: (!) 55 64 (!) 58 65  Resp: 20  18 20   Temp: 98 F (36.7 C) 98.4 F (36.9 C) (!) 97.1 F (36.2 C) 98.1 F (36.7 C)  TempSrc: Oral     SpO2: 98% 96% 97% 97%  Weight:      Height:        General - Elderly thin built Caucasian male, distress due to back pain HEENT - PERRLA, EOMI, atraumatic head, non tender sinuses. Lung - Clear, basal rales, no rhonchi, wheezes. Heart - S1, S2 heard, no murmurs, rubs, trace pedal edema. Abdomen - Soft, non tender, bowel sounds good Neuro - Alert, awake and oriented x 3, non focal exam. Skin - Warm and dry.  Data Reviewed:  Latest Ref Rng & Units 04/16/2024    1:38 AM 04/15/2024    4:28 PM 10/14/2022   10:44 AM  CBC  WBC 4.0 - 10.5 K/uL 7.9  8.7  8.0   Hemoglobin 13.0 - 17.0 g/dL 87.5  88.0  87.0   Hematocrit 39.0 - 52.0 % 36.6  35.7  37.6   Platelets 150 - 400 K/uL 178  197  178       Latest Ref Rng & Units 04/16/2024    1:38 AM 04/15/2024    4:28 PM 10/14/2022   10:44 AM  BMP  Glucose 70 - 99 mg/dL 83  897  856   BUN 8 - 23 mg/dL 12  15  18    Creatinine 0.61 - 1.24 mg/dL 9.09  8.99  8.80   Sodium 135 - 145 mmol/L 136  137  135    Potassium 3.5 - 5.1 mmol/L 3.8  4.1  3.9   Chloride 98 - 111 mmol/L 95  101  98   CO2 22 - 32 mmol/L 28  28  26    Calcium 8.9 - 10.3 mg/dL 9.2  9.2  9.8    MR LUMBAR SPINE WO CONTRAST Result Date: 04/18/2024 EXAM: MRI LUMBAR SPINE 04/18/2024 12:01:00 PM TECHNIQUE: Multiplanar multisequence MRI of the lumbar spine was performed without the administration of intravenous contrast. COMPARISON: Thoracic and lumbar spine CT 04/15/2024. Lumbar MRI 02/12/2023. CLINICAL HISTORY: 83 year old male  Low back pain, prior surgery, new symptoms. FINDINGS: BONES AND ALIGNMENT: Normal lumbar segmentation recently on CT, the same numbering system used on the previous MRI. Grade 1 anterolisthesis of L4 on L5 has increased since last year. Previous L3-L5 fusion with posterior hardware without adverse features. Normal vertebral body heights. Bone marrow signal is unremarkable. No convincing marrow edema. Intact visible sacroiliac joints. SPINAL CORD: The conus terminates normally at T12-L1. No signal abnormality in the visible lower thoracic spinal cord or conus. SOFT TISSUES: Abnormal abdominal aorta stable from recent CT / CTA. Lumbar paraspinal postoperative changes with no adverse features. There is a degree of congenital spinal canal narrowing due to short pedicle distance (such as series 5 image 8 at the L1 level). No visible lower thoracic disc degeneration superimposed. L1-L2: Circumferential disc bulge asymmetric to the right with moderate posterior element hypertrophy. Moderate spinal stenosis stable from last year. L2-L3: Circumferential disc osteophyte complex asymmetric to the left. Evidence of previous decompression, laminectomy here with mild residual spinal stenosis, stable from last year. Moderate left L2 neural foraminal stenosis is stable. L3-L4: Chronic decompression and fusion is stable. Moderate residual L3 neural foraminal stenosis is stable. L4-L5: Chronic decompression and fusion is stable. However,  large new broad based left foraminal disc extrusion since the previous MRI (series 5 image 32 and series 2 image 13). Severe increased left L4 neural foraminal stenosis. Symptomatic level favored to be L4-L5. L5-S1: Stable chronic decompression L5 fusion hardware. Moderate residual facet hypertrophy. Chronic circumferential disc osteophyte complex. No spinal stenosis. Chronic architectural distortion at the left lateral recess is stable (descending left S1 nerve root). Moderate bilateral L5 neural foraminal stenosis is stable. IMPRESSION: 1. Symptomatic level favored to be L4-L5; Large new left foraminal disc extrusion with severe increased left neural foraminal stenosis. Query left L4 radiculitis. 2. Previous lumbar decompression and fusion L3 through L5. Progression of L4-L5 grade 1 anterolisthesis. 3. 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 4. Abnormal abdominal Aorta as per recent CTA. Electronically signed by: Helayne  Shona MD 04/18/2024 12:15 PM EDT RP Workstation: [id number]     Family Communication: Discussed with patient, understand and agree. All questions answered.  Disposition: Status is: inpatient because: severe back pain, nsgy follow up, pain control, PT/ OT  Planned Discharge Destination: Home with Home Health     Time spent: 45 minutes  Author: Concepcion Riser, MD 04/19/2024 2:18 PM Secure chat 7am to 7pm For on call review [email or web address].

## 2024-04-19 NOTE — Care Management Important Message (Signed)
 Important Message  Patient Details  Name: Corey Tate MRN: [id number] Date of Birth: 13-Aug-1940   Important Message Given:  Yes - Medicare IM     Claretta Deed 04/19/2024, 1:24 PM

## 2024-04-19 NOTE — Progress Notes (Signed)
 Occupational Therapy Treatment Patient Details Name: Corey Tate MRN: [id number] DOB: April 19, 1941 Today's Date: 04/19/2024   History of present illness 83 yo admitted 10/24 with unrelenting back pain.  PMH is significant for HTN, severe COPD, PVD, s/p CEA and R EIA angioplasty, active smoker, hx of lung Ca on radiation, OSA on 2L O2 no CPAP, CKD,  6 previous back surgeries, RTHA and R TSA   OT comments  Patient received in supine and asking to use bathroom. Patient able to get to EOB with supervision and mild back pain initially.  Patient able to ambulate to bathroom with RW and CGA and min assist with clothing management. Patient with increased back pain sitting on toilet and required min assist to stand. Patient unable to ambulate to bed and transferred to chair and chair moved closer to bed to allow for patient to return to side lying.  Nursing aware of increased pain.  Discharge recommendations continue to be appropriate.  Acute OT to continue to follow to address established goals.       If plan is discharge home, recommend the following:  A little help with bathing/dressing/bathroom;Assistance with cooking/housework;Assist for transportation;Help with stairs or ramp for entrance   Equipment Recommendations  None recommended by OT    Recommendations for Other Services      Precautions / Restrictions Precautions Precautions: Fall Recall of Precautions/Restrictions: Intact Precaution/Restrictions Comments: 2 L O2 Restrictions Weight Bearing Restrictions Per Provider Order: No       Mobility Bed Mobility Overal bed mobility: Needs Assistance Bed Mobility: Rolling, Sidelying to Sit, Sit to Sidelying Rolling: Supervision, Used rails Sidelying to sit: Supervision     Sit to sidelying: Supervision General bed mobility comments: patient able to manage bed mobility with use of bed rails    Transfers Overall transfer level: Needs assistance Equipment used: Rolling walker (2  wheels) Transfers: Sit to/from Stand Sit to Stand: Contact guard assist           General transfer comment: CGA to stand from EOB and min assist from toilet due to increased pain     Balance Overall balance assessment: Needs assistance Sitting-balance support: No upper extremity supported, Feet supported Sitting balance-Leahy Scale: Good Sitting balance - Comments: EOB   Standing balance support: Bilateral upper extremity supported Standing balance-Leahy Scale: Poor Standing balance comment: reliant on UE support                           ADL either performed or assessed with clinical judgement   ADL Overall ADL's : Needs assistance/impaired                         Toilet Transfer: Contact guard assist;Minimal assistance;Ambulation;Regular Toilet;Rolling walker (2 wheels) Toilet Transfer Details (indicate cue type and reason): CGA to ambulate to toilet and transfer, min assist to exit bathroom due to increased back pain Toileting- Clothing Manipulation and Hygiene: Minimal assistance;Sit to/from stand Toileting - Clothing Manipulation Details (indicate cue type and reason): for clothing management       General ADL Comments: treatment limited due to pain    Extremity/Trunk Assessment              Vision       Perception     Praxis     Communication Communication Communication: Impaired Factors Affecting Communication: Reduced clarity of speech   Cognition Arousal: Alert Behavior During Therapy: WFL for tasks assessed/performed Cognition:  No apparent impairments                               Following commands: Intact        Cueing   Cueing Techniques: Verbal cues  Exercises      Shoulder Instructions       General Comments patient with minimum complaints of pain from EOB and increased with mobility and transferrs    Pertinent Vitals/ Pain       Pain Assessment Pain Assessment: Faces Faces Pain Scale: Hurts  whole lot Pain Location: back Pain Descriptors / Indicators: Aching, Discomfort, Grimacing, Guarding, Moaning Pain Intervention(s): Limited activity within patient's tolerance, Monitored during session, Repositioned, Patient requesting pain meds-RN notified, Heat applied  Home Living                                          Prior Functioning/Environment              Frequency  Min 1X/week        Progress Toward Goals  OT Goals(current goals can now be found in the care plan section)  Progress towards OT goals: Not progressing toward goals - comment (limited due to pain)  Acute Rehab OT Goals Patient Stated Goal: less pain OT Goal Formulation: With patient Time For Goal Achievement: 04/30/24 Potential to Achieve Goals: Fair ADL Goals Pt Will Perform Grooming: with supervision;standing Pt Will Perform Lower Body Bathing: with supervision;sit to/from stand Pt Will Perform Lower Body Dressing: with supervision;sit to/from stand Pt Will Transfer to Toilet: with supervision;ambulating  Plan      Co-evaluation                 AM-PAC OT 6 Clicks Daily Activity     Outcome Measure   Help from another person eating meals?: A Little Help from another person taking care of personal grooming?: A Little Help from another person toileting, which includes using toliet, bedpan, or urinal?: A Lot Help from another person bathing (including washing, rinsing, drying)?: A Lot Help from another person to put on and taking off regular upper body clothing?: A Little Help from another person to put on and taking off regular lower body clothing?: A Lot 6 Click Score: 15    End of Session Equipment Utilized During Treatment: Rolling walker (2 wheels);Oxygen   OT Visit Diagnosis: Unsteadiness on feet (R26.81);Pain Pain - Right/Left: Left Pain - part of body: Leg (back)   Activity Tolerance Patient limited by pain   Patient Left in bed;with call bell/phone  within reach;with bed alarm set   Nurse Communication Mobility status;Patient requests pain meds        Time: 9164-9142 OT Time Calculation (min): 22 min  Charges: OT General Charges $OT Visit: 1 Visit OT Treatments $Self Care/Home Management : 8-22 mins  Dick Laine, OTA Acute Rehabilitation Services  Office [phone]   Jeb LITTIE Laine 04/19/2024, 9:11 AM

## 2024-04-19 NOTE — Plan of Care (Signed)

## 2024-04-19 NOTE — TOC Progression Note (Signed)
 Transition of Care [facility]) - Progression Note    Patient Details  Name: Corey Tate MRN: [id number] Date of Birth: 15-Apr-1941  [facility] CM/SW Contact  Rosaline JONELLE Joe, RN Phone Number: 04/19/2024, 4:19 PM  Clinical Narrative:    Cm met with the patient at the bedside and patient is still unable to tolerate standing with PT due to severe back pain.  Patient states that he has had no BM since last week and has started vomiting today.  MD is notified and neurosurgery was notified of continued pain.  Meds were ordered by MD including an enema and KUB.  I called the patient's daughter, Olam by phone and she states that once the patient is better that patient may likely discharge to her home in [place] for care since the patient's wife is sick at this time.  [facility] was questionable regarding ability to provide staffing when I called to place the referral.   The daughter was agreeable to choose another agency and she did not have a preference.  I called [facility] and [facility] accepted for PT, OT and aide.                       Expected Discharge Plan and Services                                               Social Drivers of Health (SDOH) Interventions SDOH Screenings   Food Insecurity: No Food Insecurity (04/16/2024)  Housing: Low Risk  (04/16/2024)  Transportation Needs: No Transportation Needs (04/16/2024)  Utilities: Not At Risk (04/16/2024)  Social Connections: Unknown (04/16/2024)  Tobacco Use: High Risk (04/16/2024)    Readmission Risk Interventions    04/18/2024   11:29 AM  Readmission Risk Prevention Plan  Post Dischage Appt Complete  Medication Screening Complete  Transportation Screening Complete

## 2024-04-19 NOTE — Progress Notes (Signed)
 Patient ID: Corey Tate, male   DOB: 07-09-1940, 83 y.o.   MRN: [id number] He is sleeping peacefully this morning.  I did not wake him up as this is probably the best sleep he has had in a few days.  Continue with medical management.  I put him on 1 day of steroids.  These can be reordered if there is no contraindication.  Hopefully that will help his pain.  He can be discharged from our standpoint with plans to follow-up with us  as an outpatient.  If pain is not controllable enough for discharge then he may be a candidate for a left L4-5 transforaminal epidural steroid injection

## 2024-04-20 DIAGNOSIS — M549 Dorsalgia, unspecified: Secondary | ICD-10-CM

## 2024-04-20 DIAGNOSIS — J189 Pneumonia, unspecified organism: Secondary | ICD-10-CM | POA: Diagnosis not present

## 2024-04-20 MED ORDER — DEXAMETHASONE SOD PHOSPHATE PF 10 MG/ML IJ SOLN
4.0000 mg | Freq: Four times a day (QID) | INTRAMUSCULAR | Status: AC
  Administered 2024-04-20 – 2024-04-23 (×12): 4 mg via INTRAVENOUS

## 2024-04-20 NOTE — Progress Notes (Signed)
 Patient ID: Corey Tate, male   DOB: [birth year], 83 y.o.   MRN: [id number] Still has L LBP and LLE pain. Rec L L4-5 TF ESI with IR. DF and PF seem ok, walks with PT but it's difficult

## 2024-04-20 NOTE — Plan of Care (Signed)

## 2024-04-20 NOTE — Progress Notes (Signed)
  Progress Note   Patient: Corey Tate FMW:[id number] DOB: 03-26-1941 DOA: 04/15/2024     4 DOS: the patient was seen and examined on 04/20/2024   Brief hospital course: 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   He made appointment witj Neurosurgeron, Dr. Joshua in 2 weeks. In ED CTA C/A/P showed diffuse atherosclerotic disease but no dissection and no bony change to thoracic or lumbar spine.     Patient required 3 doses of IV opiates and still couldn't stand, so hospitalist asked to admit for better pain control, PT eval. 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 Still in lot of pain, unable to get out of bed. CT thoracolumbar spine multilevel degenerative disease noted, prior spinal instrumentation noted. Dr. Joshua reviewed MRI lumbar spine advised pain control, steroids, if pain not controlled plan for left L4-5 transforaminal epidural steroid injection. Pt is continued on tylenol , oxycodone , dilaudid  PRN, robaxin .  Pt is s/p dcadron 4mg  q8hrs for 1 day.  Continues with marked back pain this AM. Consulted IR for steroid injection per neursurgery recs  Coronary artery disease involving native coronary artery of native heart with angina pectoris.  Peripheral vascular disease. Essential hypertension-Continue amlodipine . Continue aspirin , Plavix , Zetia , pravastatin    OSA (obstructive sleep apnea) No CPAP   Cancer of upper lobe of right lung [facility]) Receiving radiation. Alk phos normal, CT ruled out osseos mets.     Paroxysmal atrial fibrillation (HCC) Currently sinus rhythm, continue telemetry.   COPD, severe (HCC) Chronic respiratory failure with hypoxia Uses home O2 PRN and at night.  No wheezing on exam. Continue home oxygen   2L. Continue Breztri. Continue PPI.      Subjective: Complaining of marked back pain this AM  Physical Exam: Vitals:   04/20/24 0343 04/20/24 0743 04/20/24 0820 04/20/24 1223  BP: 130/89 122/66  (!) 109/41  Pulse: 60 (!) 59  66  Resp: 20 18  19   Temp: 97.8 F (36.6 C) 98.2 F (36.8 C)  98.2 F (36.8 C)  TempSrc: Oral Oral  Oral  SpO2: 96% 99% 98% 95%  Weight:      Height:       General exam: Awake, laying in bed, in nad Respiratory system: Normal respiratory effort, no wheezing Cardiovascular system: regular rate, s1, s2 Gastrointestinal system: Soft, nondistended, positive BS Central nervous system: CN2-12 grossly intact, strength intact Extremities: Perfused, no clubbing Skin: Normal skin turgor, no notable skin lesions seen Psychiatry: Mood normal // affect seems normal  Data Reviewed:  There are no new results to review at this time.  Family Communication: Pt in room, family not at bedside  Disposition: Status is: Inpatient Remains inpatient appropriate because: severity of illness  Planned Discharge Destination: Home    Author: Garnette Pelt, MD 04/20/2024 2:39 PM  For on call review [email or web address].

## 2024-04-20 NOTE — Progress Notes (Signed)
 Mobility Specialist: Progress Note   04/20/24 1500  Mobility  Activity Dangled on edge of bed  Level of Assistance Standby assist, set-up cues, supervision of patient - no hands on  Mobility Referral Yes  Mobility visit 1 Mobility  Mobility Specialist Start Time (ACUTE ONLY) 0915  Mobility Specialist Stop Time (ACUTE ONLY) 0929  Mobility Specialist Time Calculation (min) (ACUTE ONLY) 14 min    Pt received in bed, agreeable to mobility session. Only able to sit EOB this session before onset of severe back pain that radiated down LLE. Unable to complete additional mobility even with lying rest break. Left in sidelying position, all needs met, call bell in reach.   Ileana Lute Mobility Specialist Please contact via SecureChat or Rehab office at [phone]

## 2024-04-21 DIAGNOSIS — M549 Dorsalgia, unspecified: Secondary | ICD-10-CM | POA: Diagnosis not present

## 2024-04-21 DIAGNOSIS — J189 Pneumonia, unspecified organism: Secondary | ICD-10-CM | POA: Diagnosis not present

## 2024-04-21 NOTE — Plan of Care (Signed)

## 2024-04-21 NOTE — Plan of Care (Signed)
  Problem: Education: Goal: Knowledge of General Education information will improve Description: Including pain rating scale, medication(s)/side effects and non-pharmacologic comfort measures 04/21/2024 2331 by Leontine, Ra'Ven S, RN Outcome: Progressing 04/21/2024 2331 by Leontine, Ra'Ven S, RN Outcome: Progressing   Problem: Health Behavior/Discharge Planning: Goal: Ability to manage health-related needs will improve 04/21/2024 2331 by Leontine, Ra'Ven S, RN Outcome: Progressing 04/21/2024 2331 by Leontine, Ra'Ven S, RN Outcome: Progressing   Problem: Clinical Measurements: Goal: Ability to maintain clinical measurements within normal limits will improve 04/21/2024 2331 by Leontine, Ra'Ven S, RN Outcome: Progressing 04/21/2024 2331 by Leontine, Ra'Ven S, RN Outcome: Progressing Goal: Will remain free from infection 04/21/2024 2331 by Leontine, Ra'Ven S, RN Outcome: Progressing 04/21/2024 2331 by Leontine, Ra'Ven S, RN Outcome: Progressing Goal: Diagnostic test results will improve 04/21/2024 2331 by Leontine, Ra'Ven S, RN Outcome: Progressing 04/21/2024 2331 by Leontine, Ra'Ven S, RN Outcome: Progressing Goal: Respiratory complications will improve 04/21/2024 2331 by Leontine, Ra'Ven S, RN Outcome: Progressing 04/21/2024 2331 by Leontine, Ra'Ven S, RN Outcome: Progressing Goal: Cardiovascular complication will be avoided 04/21/2024 2331 by Leontine, Ra'Ven S, RN Outcome: Progressing 04/21/2024 2331 by Leontine, Ra'Ven S, RN Outcome: Progressing   Problem: Activity: Goal: Risk for activity intolerance will decrease 04/21/2024 2331 by Leontine, Ra'Ven S, RN Outcome: Progressing 04/21/2024 2331 by Leontine, Ra'Ven S, RN Outcome: Progressing   Problem: Nutrition: Goal: Adequate nutrition will be maintained 04/21/2024 2331 by Leontine, Ra'Ven S, RN Outcome: Progressing 04/21/2024 2331 by Leontine, Ra'Ven S, RN Outcome: Progressing   Problem: Coping: Goal: Level of anxiety will decrease 04/21/2024 2331 by Leontine,  Ra'Ven S, RN Outcome: Progressing 04/21/2024 2331 by Leontine, Ra'Ven S, RN Outcome: Progressing   Problem: Elimination: Goal: Will not experience complications related to bowel motility 04/21/2024 2331 by Leontine, Ra'Ven S, RN Outcome: Progressing 04/21/2024 2331 by Leontine, Ra'Ven S, RN Outcome: Progressing Goal: Will not experience complications related to urinary retention 04/21/2024 2331 by Leontine, Ra'Ven S, RN Outcome: Progressing 04/21/2024 2331 by Leontine, Ra'Ven S, RN Outcome: Progressing   Problem: Pain Managment: Goal: General experience of comfort will improve and/or be controlled Outcome: Progressing   Problem: Safety: Goal: Ability to remain free from injury will improve Outcome: Progressing   Problem: Skin Integrity: Goal: Risk for impaired skin integrity will decrease Outcome: Progressing   Problem: Activity: Goal: Ability to tolerate increased activity will improve Outcome: Progressing   Problem: Clinical Measurements: Goal: Ability to maintain a body temperature in the normal range will improve Outcome: Progressing   Problem: Respiratory: Goal: Ability to maintain adequate ventilation will improve Outcome: Progressing Goal: Ability to maintain a clear airway will improve Outcome: Progressing

## 2024-04-21 NOTE — Progress Notes (Signed)
  Progress Note   Patient: Corey Tate FMW:[id number] DOB: December 08, 1940 DOA: 04/15/2024     5 DOS: the patient was seen and examined on 04/21/2024   Brief hospital course: 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   He made appointment witj Neurosurgeron, Dr. Joshua in 2 weeks. In ED CTA C/A/P showed diffuse atherosclerotic disease but no dissection and no bony change to thoracic or lumbar spine.     Patient required 3 doses of IV opiates and still couldn't stand, so hospitalist asked to admit for better pain control, PT eval. 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 Still in lot of pain, unable to get out of bed. CT thoracolumbar spine multilevel degenerative disease noted, prior spinal instrumentation noted. Dr. Joshua reviewed MRI lumbar spine advised pain control, steroids, if pain not controlled plan for left L4-5 transforaminal epidural steroid injection. Pt is continued on tylenol , oxycodone , dilaudid  PRN, robaxin .  On 10/29, had consulted IR for steroid injectionConsulted IR for steroid injection per Neurosurg, however pt had been on ASA and plavix , which would need to be held x 5 days -Per Neurosurgeon, recs for more aggressive IV steroids for now -ASA and plavix  held 10/29 incase symptoms do not improve with IV steroid and pt would need steroid injection with IR  Coronary artery disease involving native coronary artery of native heart with angina pectoris.  Peripheral vascular disease. Essential hypertension-Continue amlodipine . Continue aspirin , Plavix , Zetia , pravastatin    OSA (obstructive sleep apnea) No CPAP   Cancer of upper lobe of right lung [facility]) Receiving radiation. Alk phos normal, CT ruled out osseos mets.     Paroxysmal  atrial fibrillation (HCC) Currently sinus rhythm, continue telemetry.   COPD, severe (HCC) Chronic respiratory failure with hypoxia Uses home O2 PRN and at night.  No wheezing on exam. Continue home oxygen  2L. Continue Breztri. Continue PPI.      Subjective: Still complaining of continued back pain  Physical Exam: Vitals:   04/21/24 0054 04/21/24 0419 04/21/24 0747 04/21/24 1259  BP: (!) 142/63 113/64 113/60 (!) 114/58  Pulse: (!) 57 61 60 65  Resp: 18 20 19 19   Temp: 98 F (36.7 C) 98.6 F (37 C) 98 F (36.7 C) 98 F (36.7 C)  TempSrc: Oral Oral Oral Oral  SpO2: 95% 96% 94% 94%  Weight:      Height:       General exam: Conversant, in no acute distress Respiratory system: normal chest rise, clear, no audible wheezing Cardiovascular system: regular rhythm, s1-s2 Gastrointestinal system: Nondistended, nontender, pos BS Central nervous system: No seizures, no tremors Extremities: No cyanosis, no joint deformities Skin: No rashes, no pallor Psychiatry: Affect normal // no auditory hallucinations   Data Reviewed:  There are no new results to review at this time.  Family Communication: Pt in room, family not at bedside  Disposition: Status is: Inpatient Remains inpatient appropriate because: severity of illness  Planned Discharge Destination: Home    Author: Garnette Pelt, MD 04/21/2024 2:50 PM  For on call review [email or web address].

## 2024-04-21 NOTE — Progress Notes (Signed)
 Patient ID: Corey Tate, male   DOB: 04-20-1941, 83 y.o.   MRN: [id number] IR consulted for L4-5 ESI due to intractable back pain with  large foraminal disc extrusion and severe left neural foraminal stenosis.  Patient was seen by Dr. Joshua of neurosurgery who initially requested the [facility]. Unfortunately, patient is on ASA and Plavix  as of yesterday 10/29. IR unfortunately unable to perform ESI until full 5 day hold of both medications.  Dr. Cindy of hospitalist team spoke with Dr. Joshua of neurosurgery, with plan to try more aggressive steroids and medical therapy for now. ASA and Plavix  have been held as of yesterday, with plan for inpatient ESI after the 5 day hold if patient does not improve with the medical therapy. Can perform ESI outpatient if patient improved enough with medical therapy to go home.  Please reach out to IR with any additional questions or concerns.   Kimble DEL Yvonne Petite PA-C 04/21/2024 7:26 AM

## 2024-04-22 DIAGNOSIS — J189 Pneumonia, unspecified organism: Secondary | ICD-10-CM | POA: Diagnosis not present

## 2024-04-22 DIAGNOSIS — M549 Dorsalgia, unspecified: Secondary | ICD-10-CM | POA: Diagnosis not present

## 2024-04-22 LAB — COMPREHENSIVE METABOLIC PANEL WITH GFR
ALT: 17 U/L (ref 0–44)
AST: 19 U/L (ref 15–41)
Albumin: 3.2 g/dL — ABNORMAL LOW (ref 3.5–5.0)
Alkaline Phosphatase: 48 U/L (ref 38–126)
Anion gap: 9 (ref 5–15)
BUN: 42 mg/dL — ABNORMAL HIGH (ref 8–23)
CO2: 27 mmol/L (ref 22–32)
Calcium: 9.3 mg/dL (ref 8.9–10.3)
Chloride: 98 mmol/L (ref 98–111)
Creatinine, Ser: 1.22 mg/dL (ref 0.61–1.24)
GFR, Estimated: 60 mL/min — ABNORMAL LOW (ref >60)
Glucose, Bld: 196 mg/dL — ABNORMAL HIGH (ref 70–99)
Potassium: 4.7 mmol/L (ref 3.5–5.1)
Sodium: 134 mmol/L — ABNORMAL LOW (ref 135–145)
Total Bilirubin: 0.5 mg/dL (ref 0.0–1.2)
Total Protein: 6.7 g/dL (ref 6.5–8.1)

## 2024-04-22 LAB — CBC
HCT: 39 % (ref 39.0–52.0)
Hemoglobin: 13.2 g/dL (ref 13.0–17.0)
MCH: 32.4 pg (ref 26.0–34.0)
MCHC: 33.8 g/dL (ref 30.0–36.0)
MCV: 95.8 fL (ref 80.0–100.0)
Platelets: 193 K/uL (ref 150–400)
RBC: 4.07 MIL/uL — ABNORMAL LOW (ref 4.22–5.81)
RDW: 12.2 % (ref 11.5–15.5)
WBC: 8.8 K/uL (ref 4.0–10.5)
nRBC: 0 % (ref 0.0–0.2)

## 2024-04-22 MED ORDER — MAGNESIUM HYDROXIDE 400 MG/5ML PO SUSP
15.0000 mL | Freq: Once | ORAL | Status: AC
  Administered 2024-04-22: 15 mL via ORAL
  Filled 2024-04-22: qty 30

## 2024-04-22 NOTE — Plan of Care (Signed)

## 2024-04-22 NOTE — Progress Notes (Signed)
 Occupational Therapy Treatment Patient Details Name: Corey Tate MRN: [id number] DOB: 06-15-1941 Today's Date: 04/22/2024   History of present illness 83 yo admitted 10/24 with unrelenting back pain.  PMH is significant for HTN, severe COPD, PVD, s/p CEA and R EIA angioplasty, active smoker, hx of lung Ca on radiation, OSA on 2L O2 no CPAP, CKD,  6 previous back surgeries, RTHA and R TSA   OT comments  Patient received in supine and stating he needed to get up to use urinal. Patient able to get to EOB without assistance and stood with supervision for urinal use. Patient agreeable to address bathing and dressing due to has not addressed this week. Patient able to doff clothing seated/standing from EOB and bathe UB.  Patient with increased pain and returned to supine to address LB bathing and dressing. Patient was able to return to EOB to donn top but quickly returned to supine.  Patient continues to be limited by pain and will continue to be followed by acute OT to address established goals.       If plan is discharge home, recommend the following:  A little help with bathing/dressing/bathroom;Assistance with cooking/housework;Assist for transportation;Help with stairs or ramp for entrance   Equipment Recommendations  None recommended by OT    Recommendations for Other Services      Precautions / Restrictions Precautions Precautions: Fall Recall of Precautions/Restrictions: Intact Precaution/Restrictions Comments: 2 L O2       Mobility Bed Mobility Overal bed mobility: Modified Independent             General bed mobility comments: able to get to EOB and back to supine without assistance    Transfers Overall transfer level: Needs assistance Equipment used: None Transfers: Sit to/from Stand Sit to Stand: Contact guard assist           General transfer comment: stood from EOB to use urinal and to address LB dressing     Balance Overall balance assessment: Needs  assistance Sitting-balance support: No upper extremity supported, Feet supported Sitting balance-Leahy Scale: Good Sitting balance - Comments: EOB   Standing balance support: Single extremity supported, During functional activity Standing balance-Leahy Scale: Poor Standing balance comment: used bed rail for support when standing to use urinal and to address LB dressing                           ADL either performed or assessed with clinical judgement   ADL Overall ADL's : Needs assistance/impaired     Grooming: Wash/dry hands;Wash/dry face;Set up;Sitting   Upper Body Bathing: Minimal assistance;Sitting Upper Body Bathing Details (indicate cue type and reason): on EOB Lower Body Bathing: Moderate assistance;Bed level Lower Body Bathing Details (indicate cue type and reason): LB bathing performed at bed level due to pain Upper Body Dressing : Minimal assistance;Sitting Upper Body Dressing Details (indicate cue type and reason): pullover top seated on EOB Lower Body Dressing: Moderate assistance;Sit to/from stand;Bed level Lower Body Dressing Details (indicate cue type and reason): patient able to stand to pull down clothing but returned to supine due to pain to complete doffing pants and socks, mod assist for donning socks and pants               General ADL Comments: patient stood at bed side to use urinal and agreeable to bathing and dressing due to have not performed this week. Patient with limited sitting and standing tolerance and performed 50% of  self care tasks at bed level    Extremity/Trunk Assessment              Vision       Perception     Praxis     Communication Communication Communication: Impaired Factors Affecting Communication: Hearing impaired;Reduced clarity of speech   Cognition Arousal: Alert Behavior During Therapy: WFL for tasks assessed/performed Cognition: No apparent impairments                                Following commands: Intact        Cueing   Cueing Techniques: Verbal cues  Exercises      Shoulder Instructions       General Comments patient limited by pain    Pertinent Vitals/ Pain       Pain Assessment Pain Assessment: Faces Faces Pain Scale: Hurts whole lot Pain Location: back radiating to L LE Pain Descriptors / Indicators: Aching, Discomfort, Radiating, Numbness Pain Intervention(s): Limited activity within patient's tolerance, Monitored during session, Repositioned, Heat applied  Home Living                                          Prior Functioning/Environment              Frequency  Min 1X/week        Progress Toward Goals  OT Goals(current goals can now be found in the care plan section)  Progress towards OT goals: Progressing toward goals  Acute Rehab OT Goals Patient Stated Goal: less pain OT Goal Formulation: With patient Time For Goal Achievement: 04/30/24 Potential to Achieve Goals: Fair ADL Goals Pt Will Perform Grooming: with supervision;standing Pt Will Perform Lower Body Bathing: with supervision;sit to/from stand Pt Will Perform Lower Body Dressing: with supervision;sit to/from stand Pt Will Transfer to Toilet: with supervision;ambulating  Plan      Co-evaluation                 AM-PAC OT 6 Clicks Daily Activity     Outcome Measure   Help from another person eating meals?: A Little Help from another person taking care of personal grooming?: A Little Help from another person toileting, which includes using toliet, bedpan, or urinal?: A Lot Help from another person bathing (including washing, rinsing, drying)?: A Lot Help from another person to put on and taking off regular upper body clothing?: A Little Help from another person to put on and taking off regular lower body clothing?: A Lot 6 Click Score: 15    End of Session    OT Visit Diagnosis: Unsteadiness on feet (R26.81);Pain Pain -  Right/Left: Left Pain - part of body: Leg (back)   Activity Tolerance Patient limited by pain   Patient Left in bed;with call bell/phone within reach;with bed alarm set   Nurse Communication Mobility status        Time: 9066-9041 OT Time Calculation (min): 25 min  Charges: OT General Charges $OT Visit: 1 Visit OT Treatments $Self Care/Home Management : 23-37 mins  Dick Laine, OTA Acute Rehabilitation Services  Office [phone]   Jeb LITTIE Laine 04/22/2024, 12:23 PM

## 2024-04-22 NOTE — Progress Notes (Signed)
 Physical Therapy Treatment Patient Details Name: Corey Tate MRN: [id number] DOB: [birth year] Today's Date: 04/22/2024   History of Present Illness 83 yo admitted 10/24 with unrelenting back pain.  PMH is significant for HTN, severe COPD, PVD, s/p CEA and R EIA angioplasty, active smoker, hx of lung Ca on radiation, OSA on 2L O2 no CPAP, CKD,  6 previous back surgeries, RTHA and R TSA    PT Comments  Attempted to utilize rollator for seated rest to improve radicular pain for more ambulation, though not improved and had to get back to bed on R side.  Educated in HEP for lateral hip strength and stretch.  PT will continue to follow.     If plan is discharge home, recommend the following: A little help with walking and/or transfers;A little help with bathing/dressing/bathroom;Assistance with cooking/housework;Help with stairs or ramp for entrance;Assist for transportation   Can travel by private vehicle        Equipment Recommendations  None recommended by PT    Recommendations for Other Services       Precautions / Restrictions Precautions Precautions: Fall Recall of Precautions/Restrictions: Intact Precaution/Restrictions Comments: 2 L O2     Mobility  Bed Mobility Overal bed mobility: Modified Independent                  Transfers Overall transfer level: Needs assistance Equipment used: Rollator (4 wheels) Transfers: Sit to/from Stand Sit to Stand: Supervision           General transfer comment: locking brakes on rollator with cues    Ambulation/Gait Ambulation/Gait assistance: Contact guard assist Gait Distance (Feet): 40 Feet (x 2) Assistive device: Rollator (4 wheels) Gait Pattern/deviations: Decreased stride length, Step-through pattern, Trunk flexed, Wide base of support       General Gait Details: antalgic on L over time and more pain, seated break though not able to improve pain prior to walking back to bed   Stairs              Wheelchair Mobility     Tilt Bed    Modified Rankin (Stroke Patients Only)       Balance Overall balance assessment: Needs assistance Sitting-balance support: Feet supported Sitting balance-Leahy Scale: Good     Standing balance support: Bilateral upper extremity supported Standing balance-Leahy Scale: Poor Standing balance comment: reliant on UE support due to pain                            Communication Communication Communication: Impaired Factors Affecting Communication: Hearing impaired;Reduced clarity of speech  Cognition Arousal: Alert Behavior During Therapy: WFL for tasks assessed/performed   PT - Cognitive impairments: No apparent impairments                         Following commands: Intact      Cueing Cueing Techniques: Verbal cues  Exercises Other Exercises Other Exercises: sidelying L hip clamshell abduction x 10 Other Exercises: sidelying L hip rotator stretch with L LE in front of R x 20 sec x 3 reps    General Comments General comments (skin integrity, edema, etc.): daughter present and supportive, voiced frustration with his stay and limited options      Pertinent Vitals/Pain Pain Assessment Faces Pain Scale: Hurts whole lot Pain Location: back radiating to L LE Pain Descriptors / Indicators: Aching, Discomfort, Radiating, Numbness Pain Intervention(s): Monitored during session, Repositioned, Limited activity  within patient's tolerance    Home Living                          Prior Function            PT Goals (current goals can now be found in the care plan section) Progress towards PT goals: Progressing toward goals    Frequency    Min 2X/week      PT Plan      Co-evaluation              AM-PAC PT 6 Clicks Mobility   Outcome Measure  Help needed turning from your back to your side while in a flat bed without using bedrails?: None Help needed moving from lying on your back to  sitting on the side of a flat bed without using bedrails?: None Help needed moving to and from a bed to a chair (including a wheelchair)?: A Little Help needed standing up from a chair using your arms (e.g., wheelchair or bedside chair)?: A Little Help needed to walk in hospital room?: A Little Help needed climbing 3-5 steps with a railing? : Total 6 Click Score: 18    End of Session   Activity Tolerance: Patient limited by pain Patient left: in bed;with call bell/phone within reach;with family/visitor present   PT Visit Diagnosis: Other abnormalities of gait and mobility (R26.89);Muscle weakness (generalized) (M62.81);Difficulty in walking, not elsewhere classified (R26.2);Pain Pain - Right/Left: Left Pain - part of body: Hip     Time: 8493-8469 PT Time Calculation (min) (ACUTE ONLY): 24 min  Charges:    $Gait Training: 8-22 mins $Therapeutic Exercise: 8-22 mins PT General Charges $$ ACUTE PT VISIT: 1 Visit                     Micheline Tate, PT Acute Rehabilitation Services [phone] 04/22/2024    Corey Tate 04/22/2024, 4:51 PM

## 2024-04-22 NOTE — Progress Notes (Signed)
 Progress Note   Patient: Corey Tate FMW:[id number] DOB: [birth year] DOA: 04/15/2024     6 DOS: the patient was seen and examined on 04/22/2024   Brief hospital course: 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   He made appointment witj Neurosurgeron, Dr. Joshua in 2 weeks. In ED CTA C/A/P showed diffuse atherosclerotic disease but no dissection and no bony change to thoracic or lumbar spine.     Patient required 3 doses of IV opiates and still couldn't stand, so hospitalist asked to admit for better pain control, PT eval. 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 Still in lot of pain, unable to get out of bed. CT thoracolumbar spine multilevel degenerative disease noted, prior spinal instrumentation noted. Dr. Joshua reviewed MRI lumbar spine advised pain control, steroids, if pain not controlled plan for left L4-5 transforaminal epidural steroid injection. Pt is continued on tylenol , oxycodone , dilaudid  PRN, robaxin .  On 10/29, had consulted IR for steroid injectionConsulted IR for steroid injection per Neurosurg, however pt had been on ASA and plavix , which would need to be held x 5 days -Per Neurosurgeon, recs for more aggressive IV steroids for now -ASA and plavix  held 10/29 incase symptoms do not improve with IV steroid and pt would need steroid injection with IR -Pt reports being unable to get up or ambulate despite Neurosurgery documenting the pt being able to stand easily and move around the bed easily  Coronary artery disease involving native coronary artery of native heart with angina pectoris.  Peripheral vascular disease. Essential hypertension-Continue amlodipine . Continue Zetia , pravastatin  ASA and plavix  on hold per above   OSA  (obstructive sleep apnea) No CPAP   Cancer of upper lobe of right lung [facility]) Receiving radiation. Alk phos normal, CT ruled out osseos mets.     Paroxysmal atrial fibrillation (HCC) Currently sinus rhythm, continue telemetry.   COPD, severe (HCC) Chronic respiratory failure with hypoxia Uses home O2 PRN and at night.  No wheezing on exam. Continue home oxygen  2L. Continue Breztri. Continue PPI.      Subjective: continues to complain of marked low back pain, resulting in him being nonfunctional  Physical Exam: Vitals:   04/22/24 0433 04/22/24 0749 04/22/24 0816 04/22/24 1147  BP: (!) 121/55 137/67  101/69  Pulse: (!) 57 (!) 57  67  Resp:      Temp: 98.1 F (36.7 C) (!) 97.5 F (36.4 C)  97.9 F (36.6 C)  TempSrc:      SpO2: 95% 95% 95% 94%  Weight:      Height:       General exam: Awake, laying in bed, in nad Respiratory system: Normal respiratory effort, no wheezing Cardiovascular system: regular rate, s1, s2 Gastrointestinal system: Soft, nondistended, positive BS Central nervous system: CN2-12 grossly intact, strength intact Extremities: Perfused, no clubbing Skin: Normal skin turgor, no notable skin lesions seen Psychiatry: Mood normal // no visual hallucinations   Data Reviewed:  Labs reviewed: Na 134, K 4.7, Cr 1.22, WBC 8.8, Hgb 13.2, Plts 193  Family Communication: Pt in room, family not at bedside  Disposition: Status is: Inpatient Remains inpatient appropriate because: severity of illness  Planned Discharge Destination: Home    Author: Garnette Pelt, MD 04/22/2024  3:56 PM  For on call review [email or web address].

## 2024-04-22 NOTE — Progress Notes (Signed)
 Patient ID: Corey Tate, male   DOB: December 15, 1940, 83 y.o.   MRN: [id number] He continues to have left leg pain down the lateral part of the leg.  He did stand for me today.  He stands easily.  He moves around in the bed easily.  Certainly we would like to avoid acute surgical intervention but I cannot get him to understand that he does not have to be pain-free for discharge to home to give this some more time to see if it will get better without surgery.  Certainly the injection can be done as an outpatient if he can be discharged.  If he simply cannot be discharged and his pain will get there, we may have to fix this surgically.  We continue to follow.

## 2024-04-22 NOTE — TOC Progression Note (Signed)
 Transition of Care [facility]) - Progression Note    Patient Details  Name: Corey Tate MRN: [id number] Date of Birth: 1941/04/18  Transition of Care [facility]) CM/SW Contact  Rosaline JONELLE Joe, RN Phone Number: 04/22/2024, 10:27 AM  Clinical Narrative:    CM spoke with the patient at the bedside and patient states that he continues to have severe back pain and is still unable to tolerate sitting up for meals due to pain.  Patient remains in the bed flat in left side-lying position.  I called and spoke with the daughter, Arland for update and she states that patient's pain and mobility will need to be improved before patient is able to discharge home with family.  [facility] has been set up for home health services - pending patient improvement.                       Expected Discharge Plan and Services                                               Social Drivers of Health (SDOH) Interventions SDOH Screenings   Food Insecurity: No Food Insecurity (04/16/2024)  Housing: Low Risk  (04/16/2024)  Transportation Needs: No Transportation Needs (04/16/2024)  Utilities: Not At Risk (04/16/2024)  Social Connections: Unknown (04/16/2024)  Tobacco Use: High Risk (04/16/2024)    Readmission Risk Interventions    04/18/2024   11:29 AM  Readmission Risk Prevention Plan  Post Dischage Appt Complete  Medication Screening Complete  Transportation Screening Complete

## 2024-04-23 DIAGNOSIS — M549 Dorsalgia, unspecified: Secondary | ICD-10-CM | POA: Diagnosis not present

## 2024-04-23 DIAGNOSIS — J189 Pneumonia, unspecified organism: Secondary | ICD-10-CM | POA: Diagnosis not present

## 2024-04-23 LAB — CREATININE, SERUM
Creatinine, Ser: 1.02 mg/dL (ref 0.61–1.24)
GFR, Estimated: >60 mL/min (ref >60)

## 2024-04-23 MED ORDER — BISACODYL 10 MG RE SUPP
10.0000 mg | Freq: Once | RECTAL | Status: AC
  Administered 2024-04-23: 10 mg via RECTAL
  Filled 2024-04-23 (×2): qty 1

## 2024-04-23 NOTE — Plan of Care (Signed)

## 2024-04-23 NOTE — Progress Notes (Signed)
 Patient sat on the side of the bed (dangled) to eat dinner and tolerated position.

## 2024-04-23 NOTE — Progress Notes (Signed)
  Progress Note   Patient: Corey Tate FMW:[id number] DOB: Apr 02, 1941 DOA: 04/15/2024     7 DOS: the patient was seen and examined on 04/23/2024   Brief hospital course: 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   He made appointment witj Neurosurgeron, Dr. Joshua in 2 weeks. In ED CTA C/A/P showed diffuse atherosclerotic disease but no dissection and no bony change to thoracic or lumbar spine.     Patient required 3 doses of IV opiates and still couldn't stand, so hospitalist asked to admit for better pain control, PT eval. 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 Still in lot of pain, unable to get out of bed. CT thoracolumbar spine multilevel degenerative disease noted, prior spinal instrumentation noted. Dr. Joshua reviewed MRI lumbar spine advised pain control, steroids, if pain not controlled plan for left L4-5 transforaminal epidural steroid injection. Pt is continued on tylenol , oxycodone , dilaudid  PRN, robaxin .  On 10/29, had consulted IR for steroid injectionConsulted IR for steroid injection per Neurosurg, however pt had been on ASA and plavix , which would need to be held x 5 days -Per Neurosurgeon, recs for more aggressive IV steroids for now -ASA and plavix  held 10/29 incase symptoms do not improve with IV steroid and pt would need steroid injection with IR or even surgery if warranted by Neurosurgery -Discussed with pt and family. Are interested in pursuing SNF at [facility] on d/c. Have consulted TOC  Coronary artery disease involving native coronary artery of native heart with angina pectoris.  Peripheral vascular disease. Essential hypertension-Continue amlodipine . Continue Zetia , pravastatin  ASA and plavix  on hold per above   OSA  (obstructive sleep apnea) No CPAP   Cancer of upper lobe of right lung [facility]) Receiving radiation. Alk phos normal, CT ruled out osseos mets.     Paroxysmal atrial fibrillation (HCC) Currently sinus rhythm, continue telemetry.   COPD, severe (HCC) Chronic respiratory failure with hypoxia Uses home O2 PRN and at night.  No wheezing on exam. Continue home oxygen  2L. Continue Breztri. Continue PPI.      Subjective: Reports continued back pain.   Physical Exam: Vitals:   04/22/24 2340 04/23/24 0605 04/23/24 0740 04/23/24 0821  BP: 130/80 (!) 123/53  (!) 127/51  Pulse: (!) 57 (!) 57  (!) 53  Resp:      Temp: 98 F (36.7 C) 97.7 F (36.5 C)  98.1 F (36.7 C)  TempSrc:    Oral  SpO2: 96% 97% 98% 94%  Weight:      Height:       General exam: Conversant, in no acute distress Respiratory system: normal chest rise, clear, no audible wheezing Cardiovascular system: regular rhythm, s1-s2 Gastrointestinal system: Nondistended, nontender, pos BS Central nervous system: No seizures, no tremors Extremities: No cyanosis, no joint deformities Skin: No rashes, no pallor Psychiatry: Affect normal // no auditory hallucinations   Data Reviewed:  There are no new results to review at this time.  Family Communication: Pt in room, family not at bedside  Disposition: Status is: Inpatient Remains inpatient appropriate because: severity of illness  Planned Discharge Destination: Home    Author: Garnette Pelt, MD 04/23/2024 3:27 PM  For on call review [email or web address].

## 2024-04-23 NOTE — Progress Notes (Signed)
  NEUROSURGERY PROGRESS NOTE   No issues overnight. Pt cont to report unchanged back pain, exacerbated when up and standing/walking for a few minutes.    EXAM:  BP (!) 123/53   Pulse (!) 57   Temp 97.7 F (36.5 C)   Resp 18   Ht 5' 10.98 (1.803 m)   Wt 62.5 kg   SpO2 98%   BMI 19.23 kg/m   Awake, alert, oriented  Speech fluent, appropriate  CN grossly intact  5/5 BUE/BLE   IMPRESSION:  83 y.o. 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 Attempting to treat conservatively  PLAN: - Cont current mgmt, hopefully pt will feel comfortable going home and can try ESI. Alternatively, if he cannot d/c home could consider surgical decompression per Dr[place] [id number] [facility]

## 2024-04-23 NOTE — Plan of Care (Signed)

## 2024-04-24 DIAGNOSIS — M62838 Other muscle spasm: Secondary | ICD-10-CM

## 2024-04-24 DIAGNOSIS — M549 Dorsalgia, unspecified: Secondary | ICD-10-CM | POA: Diagnosis not present

## 2024-04-24 DIAGNOSIS — J189 Pneumonia, unspecified organism: Secondary | ICD-10-CM | POA: Diagnosis not present

## 2024-04-24 NOTE — NC FL2 (Signed)
 [place]  MEDICAID FL2 LEVEL OF CARE FORM     IDENTIFICATION  Patient Name: Corey Tate Birthdate: 08/09/1940 Sex: male Admission Date (Current Location): 04/15/2024  [facility] and [place] Number:  Producer, Television/film/video and Address:  The . [facility], [address] [place], [place] [address]      Provider Number: [id number]  Attending Physician Name and Address:  Cindy Garnette POUR, MD  Relative Name and Phone Number:       Current Level of Care: Hospital Recommended Level of Care: Skilled Nursing Facility Prior Approval Number:    Date Approved/Denied:   PASRR Number: [id number]  Discharge Plan: SNF    Current Diagnoses: Patient Active Problem List   Diagnosis Date Noted   Protein-calorie malnutrition, severe 04/18/2024   Back pain 04/15/2024   OSA (obstructive sleep apnea) 04/15/2024   Abdominal aortic aneurysm (AAA) without rupture 05/28/2023   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) 05/28/2023   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   S/P lumbar laminectomy 10/21/2022   S/P lumbar fusion 10/20/2022   Neck mass 07/15/2022   Sebaceous cyst 07/15/2022   Shortness of breath    Hypertension    GERD (gastroesophageal reflux disease)    Cancer (HCC)    CAD (coronary artery disease)    Asthma    HTN (hypertension) 02/08/2020   CKD (chronic kidney disease), stage II 02/08/2020   Abnormal cardiac CT angiography 01/31/2020   Coronary artery disease involving native coronary artery of native heart with angina pectoris 01/31/2020   DOE (dyspnea on exertion)  - as Angina Equivalent 01/31/2020   Pneumonia 2021   Chronic anticoagulation 12/31/2017   Anxiety 12/11/2017   Arthritis 12/11/2017   Carotid bruit 12/11/2017   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   H/O varicella 12/11/2017   Paroxysmal atrial fibrillation (HCC) 06/29/2017   Atrial flutter by electrocardiogram (HCC) 12/10/2016   History of atrial fibrillation 12/10/2016    Syncope 08/04/2016   COPD, severe (HCC) 03/04/2016   Cough 03/04/2016   Nocturnal hypoxemia 03/04/2016   Smoking greater than 40 pack years 03/04/2016   Atherosclerosis of native artery of both lower extremities 01/31/2016   Bilateral carotid artery stenosis 01/31/2016   Dyslipidemia 09/26/2015   Nicotine dependence, uncomplicated 09/26/2015   Peripheral vascular disease 09/26/2015   Lumbar spondylosis 11/06/2014   Spondylolisthesis 10/16/2014   Spinal stenosis of lumbar region 10/02/2014   Chronic back pain greater than 3 months duration 10/02/2014   Degenerative arthritis of hip 04/02/2012    Orientation RESPIRATION BLADDER Height & Weight     Self, Time, Situation, Place  O2 (2L [place]) Continent Weight: 137 lb 12.6 oz (62.5 kg) Height:  5' 10.98 (180.3 cm)  BEHAVIORAL SYMPTOMS/MOOD NEUROLOGICAL BOWEL NUTRITION STATUS      Continent    AMBULATORY STATUS COMMUNICATION OF NEEDS Skin   Limited Assist Verbally Normal                       Personal Care Assistance Level of Assistance  Bathing, Feeding, Dressing   Feeding assistance: Limited assistance Dressing Assistance: Limited assistance     Functional Limitations Info  Hearing, Sight, Speech Sight Info: Adequate Hearing Info: Impaired Speech Info: Adequate    SPECIAL CARE FACTORS FREQUENCY  PT (By licensed PT), OT (By licensed OT)                    Contractures Contractures Info: Not present    Additional  Factors Info  Code Status Code Status Info: FULL CODE             Current Medications (04/24/2024):  This is the current hospital active medication list Current Facility-Administered Medications  Medication Dose Route Frequency Provider Last Rate Last Admin   acetaminophen  (TYLENOL ) tablet 1,000 mg  1,000 mg Oral TID Jonel Lonni SQUIBB, MD   1,000 mg at 04/23/24 2147   amLODipine  (NORVASC ) tablet 5 mg  5 mg Oral QODAY Danford, Lonni SQUIBB, MD   5 mg at 04/22/24 1036    budesonide-glycopyrrolate -formoterol  (BREZTRI) 160-9-4.8 MCG/ACT inhaler 2 puff  2 puff Inhalation BID Jonel Lonni SQUIBB, MD   2 puff at 04/23/24 1920   docusate sodium  (COLACE) capsule 100 mg  100 mg Oral BID Sreeram, Narendranath, MD   100 mg at 04/23/24 2148   enoxaparin (LOVENOX) injection 40 mg  40 mg Subcutaneous Q24H Danford, Lonni SQUIBB, MD   40 mg at 04/23/24 1006   ezetimibe  (ZETIA ) tablet 10 mg  10 mg Oral Daily Danford, Lonni SQUIBB, MD   10 mg at 04/23/24 1005   feeding supplement (ENSURE PLUS HIGH PROTEIN) liquid 237 mL  237 mL Oral BID BM Sreeram, Concepcion, MD   237 mL at 04/23/24 1456   gabapentin  (NEURONTIN ) capsule 300 mg  300 mg Oral BID Jonel Lonni SQUIBB, MD   300 mg at 04/23/24 2148   HYDROmorphone  (DILAUDID ) injection 0.5 mg  0.5 mg Intravenous Q3H PRN Darci Concepcion, MD   0.5 mg at 04/22/24 2012   lidocaine  (LIDODERM ) 5 % 1 patch  1 patch Transdermal Q24H Sreeram, Narendranath, MD   1 patch at 04/23/24 1436   methocarbamol  (ROBAXIN ) tablet 500 mg  500 mg Oral Q6H PRN Jonel Lonni SQUIBB, MD   500 mg at 04/24/24 0501   ondansetron  (ZOFRAN ) injection 4 mg  4 mg Intravenous Q8H PRN Darci Concepcion, MD       oxyCODONE  (Oxy IR/ROXICODONE ) immediate release tablet 5 mg  5 mg Oral Q4H PRN Jonel Lonni SQUIBB, MD   5 mg at 04/24/24 0501   pantoprazole  (PROTONIX ) EC tablet 20 mg  20 mg Oral Daily Danford, Lonni SQUIBB, MD   20 mg at 04/23/24 1005   polyethylene glycol (MIRALAX  / GLYCOLAX ) packet 17 g  17 g Oral Daily Sreeram, Narendranath, MD   17 g at 04/23/24 1005   pravastatin  (PRAVACHOL ) tablet 40 mg  40 mg Oral Daily Danford, Lonni SQUIBB, MD   40 mg at 04/23/24 1005   promethazine  (PHENERGAN ) 12.5 mg in sodium chloride  0.9 % 50 mL IVPB  12.5 mg Intravenous Q8H PRN Darci Concepcion, MD       senna (SENOKOT) tablet 17.2 mg  2 tablet Oral QHS Sreeram, Narendranath, MD   17.2 mg at 04/23/24 2147     Discharge Medications: Please see  discharge summary for a list of discharge medications.  Relevant Imaging Results:  Relevant Lab Results:   Additional Information SS# [id number]  Gwenn Julien [place], [place]

## 2024-04-24 NOTE — Progress Notes (Signed)
 Progress Note   Patient: Corey Tate FMW:[id number] DOB: 1940/10/07 DOA: 04/15/2024     8 DOS: the patient was seen and examined on 04/24/2024   Brief hospital course: 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   He made appointment witj Neurosurgeron, Dr. Joshua in 2 weeks. In ED CTA C/A/P showed diffuse atherosclerotic disease but no dissection and no bony change to thoracic or lumbar spine.     Patient required 3 doses of IV opiates and still couldn't stand, so hospitalist asked to admit for better pain control, PT eval. 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 Still in lot of pain, unable to get out of bed. CT thoracolumbar spine multilevel degenerative disease noted, prior spinal instrumentation noted. Dr. Joshua reviewed MRI lumbar spine advised pain control, steroids, if pain not controlled plan for left L4-5 transforaminal epidural steroid injection. Pt is continued on tylenol , oxycodone , dilaudid  PRN, robaxin .  On 10/29, had consulted IR for steroid injectionConsulted IR for steroid injection per Neurosurg, however pt had been on ASA and plavix , which would need to be held x 5 days -Per Neurosurgeon, recs for more aggressive IV steroids for now -ASA and plavix  held 10/29 incase symptoms do not improve with IV steroid and pt would need steroid injection with IR or even surgery if warranted by Neurosurgery -Discussed with pt and family. Are interested in pursuing SNF at [facility] on d/c. Have consulted TOC  Coronary artery disease involving native coronary artery of native heart with angina pectoris.  Peripheral vascular disease. Essential hypertension-Continue amlodipine . Continue Zetia , pravastatin  ASA and plavix  remains on hold per above    OSA (obstructive sleep apnea) No CPAP   Cancer of upper lobe of right lung [facility] Receiving radiation. Alk phos normal, CT ruled out osseos mets.     Paroxysmal atrial fibrillation (HCC) Currently sinus rhythm, continue telemetry.   COPD, severe (HCC) Chronic respiratory failure with hypoxia Uses home O2 PRN and at night.  No wheezing on exam. Continue home oxygen  2L. Continue Breztri. Continue PPI.      Subjective: Pt complaining of back pain while laying in bed  Physical Exam: Vitals:   04/24/24 0057 04/24/24 0427 04/24/24 0759 04/24/24 1210  BP: 117/71 137/62 (!) 131/59 (!) 118/53  Pulse: (!) 55 (!) 49 (!) 47 (!) 57  Resp: 20 20 17    Temp: 98 F (36.7 C) 97.7 F (36.5 C) 98 F (36.7 C) (!) 97.5 F (36.4 C)  TempSrc: Oral Oral Oral   SpO2: 99% 99% 97% 97%  Weight:      Height:       General exam: Awake, laying in bed, in nad Respiratory system: Normal respiratory effort, no wheezing Cardiovascular system: regular rate, s1, s2 Gastrointestinal system: Soft, nondistended, positive BS Central nervous system: CN2-12 grossly intact, strength intact Extremities: Perfused, no clubbing Skin: Normal skin turgor, no notable skin lesions seen Psychiatry: Mood normal // no visual hallucinations   Data Reviewed:  There are no new results to review at this time.  Family Communication: Pt in room, family not at bedside  Disposition: Status is: Inpatient Remains inpatient appropriate because: severity of illness  Planned Discharge Destination: Home    Author: Garnette Pelt, MD 04/24/2024 2:00 PM  For on call review [email or web address].

## 2024-04-24 NOTE — TOC Progression Note (Signed)
 Transition of Care [facility]) - Progression Note    Patient Details  Name: Corey Tate MRN: [id number] Date of Birth: 03/23/1941  [facility] CM/SW Contact  Gwenn Julien [place], [place] Phone Number: 04/24/2024, 9:35 AM  Clinical Narrative:  Consult received for SNF placement. PT rec is currently for [facility]. Per MD, pt and family agreeable to SNF and will request PT re-eval. Spoke to pt's dtr Olam who confirmed agreeable to SNF and is requesting [facility]. Reviewed SNF placement process and answered questions. Will begin SNF search and f/u with offers as available.   Julien Gwenn, MSW, LCSW [phone] (coverage)                        Expected Discharge Plan and Services                                               Social Drivers of Health (SDOH) Interventions SDOH Screenings   Food Insecurity: No Food Insecurity (04/16/2024)  Housing: Low Risk  (04/16/2024)  Transportation Needs: No Transportation Needs (04/16/2024)  Utilities: Not At Risk (04/16/2024)  Social Connections: Unknown (04/16/2024)  Tobacco Use: High Risk (04/16/2024)    Readmission Risk Interventions    04/18/2024   11:29 AM  Readmission Risk Prevention Plan  Post Dischage Appt Complete  Medication Screening Complete  Transportation Screening Complete

## 2024-04-24 NOTE — Progress Notes (Signed)
 Patient ID: Corey Tate, male   DOB: 1940/07/28, 83 y.o.   MRN: [id number] BP (!) 118/53 (BP Location: Left Arm)   Pulse (!) 57   Temp (!) 97.5 F (36.4 C)   Resp 17   Ht 5' 10.98 (1.803 m)   Wt 62.5 kg   SpO2 97%   BMI 19.23 kg/m  Alert, oriented following all commands Moving all extremities Swears(his own word)that he is hurting too much to sit up, to move more than the minimum. Also since Dr. Joshua has done 8 other surgeries why should he wait.  I explained that time does better than many of us .

## 2024-04-24 NOTE — Plan of Care (Signed)

## 2024-04-25 DIAGNOSIS — J189 Pneumonia, unspecified organism: Secondary | ICD-10-CM | POA: Diagnosis not present

## 2024-04-25 DIAGNOSIS — M549 Dorsalgia, unspecified: Secondary | ICD-10-CM | POA: Diagnosis not present

## 2024-04-25 MED ORDER — BISACODYL 10 MG RE SUPP
10.0000 mg | Freq: Once | RECTAL | Status: AC
  Administered 2024-04-25: 10 mg via RECTAL
  Filled 2024-04-25: qty 1

## 2024-04-25 MED ORDER — GUAIFENESIN-DM 100-10 MG/5ML PO SYRP
5.0000 mL | ORAL_SOLUTION | ORAL | Status: DC | PRN
  Administered 2024-04-25 – 2024-05-01 (×3): 5 mL via ORAL
  Filled 2024-04-25 (×3): qty 10

## 2024-04-25 NOTE — Progress Notes (Signed)
 Progress Note   Patient: Corey Tate FMW:[id number] DOB: 09-07-1940 DOA: 04/15/2024     9 DOS: the patient was seen and examined on 04/25/2024   Brief hospital course: 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   He made appointment witj Neurosurgeron, Dr. Joshua in 2 weeks. In ED CTA C/A/P showed diffuse atherosclerotic disease but no dissection and no bony change to thoracic or lumbar spine.     Patient required 3 doses of IV opiates and still couldn't stand, so hospitalist asked to admit for better pain control, PT eval. 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 Still in lot of pain, unable to get out of bed. CT thoracolumbar spine multilevel degenerative disease noted, prior spinal instrumentation noted. Dr. Joshua reviewed MRI lumbar spine advised pain control, steroids, if pain not controlled plan for left L4-5 transforaminal epidural steroid injection. Pt is continued on tylenol , oxycodone , dilaudid  PRN, robaxin .  On 10/29, had consulted IR for steroid injectionConsulted IR for steroid injection per Neurosurg, however pt had been on ASA and plavix , which would need to be held x 5 days -Per Neurosurgeon, recs for more aggressive IV steroids for now -ASA and plavix  held 10/29 incase symptoms do not improve with IV steroid and pt would need steroid injection with IR or even surgery if warranted by Neurosurgery -Discussed with pt and family. Are interested in pursuing SNF at [facility] on d/c. TOC consulted -Still reporting significant back pain this AM  Coronary artery disease involving native coronary artery of native heart with angina pectoris.  Peripheral vascular disease. Essential hypertension-Continue amlodipine . Continue Zetia ,  pravastatin  ASA and plavix  remains on hold per above   OSA (obstructive sleep apnea) No CPAP   Cancer of upper lobe of right lung [facility]) Receiving radiation. Alk phos normal, CT ruled out osseos mets.     Paroxysmal atrial fibrillation (HCC) Currently sinus rhythm, continue telemetry.   COPD, severe (HCC) Chronic respiratory failure with hypoxia Uses home O2 PRN and at night.  No wheezing on exam. Continue home oxygen  2L. Continue Breztri. Continue PPI.      Subjective:Still complaining of significant low back pain  Physical Exam: Vitals:   04/24/24 1720 04/24/24 2009 04/25/24 0348 04/25/24 0819  BP: (!) 107/52 (!) 103/53 (!) 116/59 (P) 98/66  Pulse: 61 (!) 57 (!) 53 (P) 63  Resp:  20 20   Temp:  98.2 F (36.8 C) (!) 97.4 F (36.3 C) (P) 98 F (36.7 C)  TempSrc:  Oral Oral (P) Oral  SpO2: 96% 93% 96% (P) 96%  Weight:      Height:       General exam: Conversant, in no acute distress Respiratory system: normal chest rise, clear, no audible wheezing Cardiovascular system: regular rhythm, s1-s2 Gastrointestinal system: Nondistended, nontender, pos BS Central nervous system: No seizures, no tremors Extremities: No cyanosis, no joint deformities Skin: No rashes, no pallor Psychiatry: Affect normal // no auditory hallucinations   Data Reviewed:  There are no new results to review at this time.  Family Communication: Pt in room, family not at bedside  Disposition: Status is: Inpatient Remains inpatient appropriate because: severity of illness  Planned Discharge Destination: Home    Author: Garnette Pelt, MD 04/25/2024 2:31  PM  For on call review [email or web address].

## 2024-04-25 NOTE — Progress Notes (Signed)
 Nutrition Follow-up  DOCUMENTATION CODES:   Severe malnutrition in context of chronic illness  INTERVENTION:  Continue Regular diet  Encourage PO intake  Continue Ensure Plus High Protein po BID, each supplement provides 350 kcal and 20 grams of protein.     NUTRITION DIAGNOSIS:   Moderate Malnutrition related to chronic illness as evidenced by energy intake < or equal to 75% for > or equal to 1 month, moderate fat depletion, severe muscle depletion.   GOAL:   Patient will meet greater than or equal to 90% of their needs   MONITOR:   PO intake, Supplement acceptance  REASON FOR ASSESSMENT:   Consult Diet education, Poor PO  ASSESSMENT:   PMHx:HTN, severe COPD, PVD s/p left CEA and right EIA angioplasty, active smoker, hx lung CA on radiation, OSA on 2L home O2 no CPAP. Presented with severe unrelenting back pain.  Pt seen in room, family at bedside. Pt halfway through his lunch, he reports taking break to lie flat on the bed for a while before he finish his meal. 100% breakfast consumed this morning, PO intake slowly improving. Pt has been drinking ensure, prefers chocolate flavor.  Pt with continued back pain today, has PRN dilaudid  and Roxicodone  ordered.   Admit weight: 62.5 kg  Current weight: 62.5 kg  Nutritionally Relevant Medications: Colace, protonix , miralax   Labs Reviewed: Na 134, Glu 196, BUN 42   NUTRITION - FOCUSED PHYSICAL EXAM:  Flowsheet Row Most Recent Value  Orbital Region Moderate depletion  Upper Arm Region Severe depletion  Thoracic and Lumbar Region Moderate depletion  Buccal Region Moderate depletion  Temple Region Severe depletion  Clavicle Bone Region Severe depletion  Clavicle and Acromion Bone Region Severe depletion  Scapular Bone Region Moderate depletion  Dorsal Hand Moderate depletion  Patellar Region Severe depletion  Anterior Thigh Region Severe depletion  Posterior Calf Region Moderate depletion  Hair Reviewed  Eyes  Reviewed  Mouth Reviewed  Skin Reviewed  Nails Reviewed    Diet Order:   Diet Order             Diet regular Room service appropriate? Yes; Fluid consistency: Thin  Diet effective now                   EDUCATION NEEDS:   No education needs have been identified at this time  Skin:  Skin Assessment: Reviewed RN Assessment  Last BM:  11/01  Height:   Ht Readings from Last 1 Encounters:  04/16/24 5' 10.98 (1.803 m)    Weight:   Wt Readings from Last 1 Encounters:  04/16/24 62.5 kg     BMI:  Body mass index is 19.23 kg/m.  Estimated Nutritional Needs:   Kcal:  1875-2200 kcal  Protein:  75-95 grams  Fluid:  1.8-2.2L/d  Madalyn Potters, MS, RD, LDN Clinical Dietitian  Contact via secure chat. If unavailable, use group chat RD Inpatient.

## 2024-04-25 NOTE — Progress Notes (Signed)
 Physical Therapy Treatment Patient Details Name: Corey Tate MRN: [id number] DOB: 1940/07/31 Today's Date: 04/25/2024   History of Present Illness 83 yo admitted 10/24 with unrelenting back pain.  PMH is significant for HTN, severe COPD, PVD, s/p CEA and R EIA angioplasty, active smoker, hx of lung Ca on radiation, OSA on 2L O2 no CPAP, CKD,  6 previous back surgeries, RTHA and R TSA    PT Comments  Patient unable to tolerated much ambulation and with difficulty getting comfortable once back laying on R side.  Reports rough weekend with medication interaction and spasms.  Patient unable to return home alone due to limited activity tolerance and risk for falls with pain L LE with very short distance ambulation.  Recommend post-acute inpatient rehab (<3 hours/day) prior to d/c home.  PT will continue to follow as pt tolerates.     If plan is discharge home, recommend the following: A little help with walking and/or transfers;A little help with bathing/dressing/bathroom;Assistance with cooking/housework;Help with stairs or ramp for entrance;Assist for transportation   Can travel by private vehicle     No  Equipment Recommendations  None recommended by PT    Recommendations for Other Services       Precautions / Restrictions Precautions Precautions: Fall Recall of Precautions/Restrictions: Intact Precaution/Restrictions Comments: 2 L O2     Mobility  Bed Mobility Overal bed mobility: Modified Independent             General bed mobility comments: able to get to EOB and back to supine without assistance    Transfers Overall transfer level: Needs assistance Equipment used: Rollator (4 wheels) Transfers: Sit to/from Stand Sit to Stand: Contact guard assist           General transfer comment: cues for safety transition to sitting from standing for positioning because of significant pain    Ambulation/Gait Ambulation/Gait assistance: Contact guard assist Gait Distance  (Feet): 30 Feet Assistive device: Rollator (4 wheels) Gait Pattern/deviations: Step-through pattern, Decreased stride length, Trunk flexed, Wide base of support, Antalgic       General Gait Details: L antalgia, needed to stand a moment prior to ambulation reported some dizziness, though increased pain with ambulation   Stairs             Wheelchair Mobility     Tilt Bed    Modified Rankin (Stroke Patients Only)       Balance Overall balance assessment: Needs assistance Sitting-balance support: Feet supported Sitting balance-Leahy Scale: Good Sitting balance - Comments: EOB   Standing balance support: Bilateral upper extremity supported Standing balance-Leahy Scale: Poor Standing balance comment: UE support with flexed posture with severe L hip and LE pain                            Communication Communication Communication: Impaired Factors Affecting Communication: Hearing impaired;Reduced clarity of speech  Cognition Arousal: Alert Behavior During Therapy: WFL for tasks assessed/performed   PT - Cognitive impairments: No apparent impairments                         Following commands: Intact      Cueing Cueing Techniques: Verbal cues  Exercises      General Comments General comments (skin integrity, edema, etc.): daughter present and voiced continued frustration      Pertinent Vitals/Pain Pain Assessment Faces Pain Scale: Hurts worst Pain Location: back radiating to L  LE after short distance ambulation Pain Descriptors / Indicators: Aching, Discomfort, Radiating, Numbness Pain Intervention(s): Monitored during session, Limited activity within patient's tolerance, Ice applied    Home Living                          Prior Function            PT Goals (current goals can now be found in the care plan section) Progress towards PT goals: Progressing toward goals    Frequency    Min 2X/week      PT Plan       Co-evaluation              AM-PAC PT 6 Clicks Mobility   Outcome Measure  Help needed turning from your back to your side while in a flat bed without using bedrails?: None Help needed moving from lying on your back to sitting on the side of a flat bed without using bedrails?: None Help needed moving to and from a bed to a chair (including a wheelchair)?: A Little Help needed standing up from a chair using your arms (e.g., wheelchair or bedside chair)?: A Little Help needed to walk in hospital room?: A Little Help needed climbing 3-5 steps with a railing? : Total 6 Click Score: 18    End of Session Equipment Utilized During Treatment: Gait belt Activity Tolerance: Patient limited by pain Patient left: in bed;with call bell/phone within reach;with family/visitor present   PT Visit Diagnosis: Other abnormalities of gait and mobility (R26.89);Muscle weakness (generalized) (M62.81);Difficulty in walking, not elsewhere classified (R26.2);Pain Pain - Right/Left: Left Pain - part of body: Hip;Leg     Time: 8557-8498 PT Time Calculation (min) (ACUTE ONLY): 19 min  Charges:    $Gait Training: 8-22 mins PT General Charges $$ ACUTE PT VISIT: 1 Visit                     Micheline Portal, PT Acute Rehabilitation Services [phone] 04/25/2024    [name] 04/25/2024, 5:15 PM

## 2024-04-25 NOTE — Plan of Care (Signed)

## 2024-04-26 DIAGNOSIS — M549 Dorsalgia, unspecified: Secondary | ICD-10-CM | POA: Diagnosis not present

## 2024-04-26 DIAGNOSIS — J189 Pneumonia, unspecified organism: Secondary | ICD-10-CM | POA: Diagnosis not present

## 2024-04-26 MED ORDER — BISACODYL 10 MG RE SUPP: 10.0000 mg | Freq: Once | RECTAL | Status: DC

## 2024-04-26 MED ORDER — OXYCODONE HCL 5 MG PO TABS
5.0000 mg | ORAL_TABLET | ORAL | Status: DC | PRN
  Administered 2024-04-26: 5 mg via ORAL
  Filled 2024-04-26 (×2): qty 1

## 2024-04-26 MED ORDER — POLYETHYLENE GLYCOL 3350 17 G PO PACK
17.0000 g | PACK | Freq: Every day | ORAL | Status: DC | PRN
  Administered 2024-04-30: 17 g via ORAL
  Filled 2024-04-26: qty 1

## 2024-04-26 MED ORDER — MAGNESIUM CITRATE PO SOLN
1.0000 | Freq: Once | ORAL | Status: AC
  Administered 2024-04-26: 1 via ORAL
  Filled 2024-04-26: qty 296

## 2024-04-26 NOTE — Progress Notes (Signed)
 Progress Note   Patient: Corey Tate FMW:[id number] DOB: Dec 14, 1940 DOA: 04/15/2024     10 DOS: the patient was seen and examined on 04/26/2024   Brief hospital course: 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   He made appointment witj Neurosurgeron, Dr. Joshua in 2 weeks. In ED CTA C/A/P showed diffuse atherosclerotic disease but no dissection and no bony change to thoracic or lumbar spine.     Patient required 3 doses of IV opiates and still couldn't stand, so hospitalist asked to admit for better pain control, PT eval. 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 Still in lot of pain, unable to get out of bed. CT thoracolumbar spine multilevel degenerative disease noted, prior spinal instrumentation noted. Dr. Joshua reviewed MRI lumbar spine advised pain control, steroids, if pain not controlled plan for left L4-5 transforaminal epidural steroid injection. Pt is continued on tylenol , oxycodone , dilaudid  PRN, robaxin .  On 10/29, had consulted IR for steroid injectionConsulted IR for steroid injection per Neurosurg, however pt had been on ASA and plavix , which would need to be held x 5 days -Per Neurosurgeon, recs for more aggressive IV steroids, now completed -ASA and plavix  held 10/29 incase symptoms do not improve with IV steroid and pt would need steroid injection with IR or even surgery if warranted by Neurosurgery -Discussed with pt and family. Are interested in pursuing SNF at [facility] on d/c. TOC consulted -This AM, pt continues to complain of marked low back pain. Discussed with Neurosurgery. Recommendation for lumbar steroid injection per IR. Pt has now been off ASA and plavix  over 5 days. Consulted IR  Coronary artery disease involving  native coronary artery of native heart with angina pectoris.  Peripheral vascular disease. Essential hypertension-Continue amlodipine . Continue Zetia , pravastatin  ASA and plavix  remains on hold per above   OSA (obstructive sleep apnea) No CPAP   Cancer of upper lobe of right lung [facility]) Receiving radiation. Alk phos normal, CT ruled out osseos mets.     Paroxysmal atrial fibrillation (HCC) Currently sinus rhythm, continue telemetry.   COPD, severe (HCC) Chronic respiratory failure with hypoxia Uses home O2 PRN and at night.  No wheezing on exam. Continue home oxygen  2L. Continue Breztri. Continue PPI.      Subjective:Continues to complain of marked low back pain  Physical Exam: Vitals:   04/26/24 0904 04/26/24 0919 04/26/24 1049 04/26/24 1100  BP: 114/60  101/61 (!) 107/56  Pulse:   66 64  Resp:   20 18  Temp:    98.7 F (37.1 C)  TempSrc:      SpO2:  95% 93% 97%  Weight:      Height:       General exam: Awake, laying in bed, in nad Respiratory system: Normal respiratory effort, no wheezing Cardiovascular system: regular rate, s1, s2 Gastrointestinal system: Soft, nondistended, positive BS Central nervous system: CN2-12 grossly intact, strength intact Extremities: Perfused, no clubbing Skin: Normal skin turgor, no notable skin lesions seen Psychiatry: Mood normal // no visual hallucinations   Data Reviewed:  There are no new results to review at this time.  Family Communication: Pt in room, family not at bedside  Disposition: Status is: Inpatient Remains inpatient appropriate because: severity of  illness  Planned Discharge Destination: Skilled nursing facility    Author: Garnette Pelt, MD 04/26/2024 1:23 PM  For on call review [email or web address].

## 2024-04-26 NOTE — Plan of Care (Signed)

## 2024-04-26 NOTE — Plan of Care (Signed)
   Problem: Activity: Goal: Risk for activity intolerance will decrease Outcome: Progressing   Problem: Pain Managment: Goal: General experience of comfort will improve and/or be controlled Outcome: Progressing   Problem: Safety: Goal: Ability to remain free from injury will improve Outcome: Progressing

## 2024-04-26 NOTE — Progress Notes (Addendum)
 Mobility Specialist: Progress Note   04/26/24 1200  Mobility  Activity Ambulated with assistance  Level of Assistance Contact guard assist, steadying assist  Assistive Device Four wheel walker  Distance Ambulated (ft) 50 ft  Activity Response Tolerated well  Mobility Referral Yes  Mobility visit 1 Mobility  Mobility Specialist Start Time (ACUTE ONLY) 1125  Mobility Specialist Stop Time (ACUTE ONLY) 1138  Mobility Specialist Time Calculation (min) (ACUTE ONLY) 13 min    Pt received in bed, agreeable to mobility session. Pt premedicated and motivated to try. SV for bed mobility. CGA for STS and ambulation. Pt was able to walk a few feet into the hallway and back without a standing or seated break. Returned to supine immediately after sitting down d/t onset of pain radiating down LLE. Left in supine with all needs met, call bell in reach. Bed alarm on.   Ileana Lute Mobility Specialist Please contact via SecureChat or Rehab office at [phone]

## 2024-04-27 DIAGNOSIS — I25119 Atherosclerotic heart disease of native coronary artery with unspecified angina pectoris: Secondary | ICD-10-CM | POA: Diagnosis not present

## 2024-04-27 DIAGNOSIS — M549 Dorsalgia, unspecified: Secondary | ICD-10-CM | POA: Diagnosis not present

## 2024-04-27 DIAGNOSIS — M62838 Other muscle spasm: Secondary | ICD-10-CM | POA: Diagnosis not present

## 2024-04-27 DIAGNOSIS — M5440 Lumbago with sciatica, unspecified side: Secondary | ICD-10-CM | POA: Diagnosis not present

## 2024-04-27 LAB — COMPREHENSIVE METABOLIC PANEL WITH GFR
ALT: 37 U/L (ref 0–44)
AST: 26 U/L (ref 15–41)
Albumin: 3.2 g/dL — ABNORMAL LOW (ref 3.5–5.0)
Alkaline Phosphatase: 46 U/L (ref 38–126)
Anion gap: 9 (ref 5–15)
BUN: 32 mg/dL — ABNORMAL HIGH (ref 8–23)
CO2: 29 mmol/L (ref 22–32)
Calcium: 9.2 mg/dL (ref 8.9–10.3)
Chloride: 98 mmol/L (ref 98–111)
Creatinine, Ser: 1.06 mg/dL (ref 0.61–1.24)
GFR, Estimated: >60 mL/min (ref >60)
Glucose, Bld: 118 mg/dL — ABNORMAL HIGH (ref 70–99)
Potassium: 4.2 mmol/L (ref 3.5–5.1)
Sodium: 136 mmol/L (ref 135–145)
Total Bilirubin: 0.6 mg/dL (ref 0.0–1.2)
Total Protein: 6.3 g/dL — ABNORMAL LOW (ref 6.5–8.1)

## 2024-04-27 LAB — CBC
HCT: 35.9 % — ABNORMAL LOW (ref 39.0–52.0)
Hemoglobin: 12.5 g/dL — ABNORMAL LOW (ref 13.0–17.0)
MCH: 32.7 pg (ref 26.0–34.0)
MCHC: 34.8 g/dL (ref 30.0–36.0)
MCV: 94 fL (ref 80.0–100.0)
Platelets: 186 K/uL (ref 150–400)
RBC: 3.82 MIL/uL — ABNORMAL LOW (ref 4.22–5.81)
RDW: 12.4 % (ref 11.5–15.5)
WBC: 10.5 K/uL (ref 4.0–10.5)
nRBC: 0 % (ref 0.0–0.2)

## 2024-04-27 LAB — MAGNESIUM: Magnesium: 2.4 mg/dL (ref 1.7–2.4)

## 2024-04-27 MED ORDER — GABAPENTIN 300 MG PO CAPS
300.0000 mg | ORAL_CAPSULE | Freq: Three times a day (TID) | ORAL | Status: DC
  Administered 2024-04-27 – 2024-05-03 (×16): 300 mg via ORAL
  Filled 2024-04-27 (×16): qty 1

## 2024-04-27 MED ORDER — OXYCODONE HCL 5 MG PO TABS
10.0000 mg | ORAL_TABLET | ORAL | Status: DC | PRN
  Administered 2024-04-27 – 2024-05-03 (×8): 10 mg via ORAL
  Filled 2024-04-27 (×9): qty 2

## 2024-04-27 MED ORDER — IPRATROPIUM-ALBUTEROL 0.5-2.5 (3) MG/3ML IN SOLN
3.0000 mL | Freq: Two times a day (BID) | RESPIRATORY_TRACT | Status: DC
  Filled 2024-04-27: qty 3

## 2024-04-27 MED ORDER — IPRATROPIUM-ALBUTEROL 0.5-2.5 (3) MG/3ML IN SOLN
3.0000 mL | Freq: Four times a day (QID) | RESPIRATORY_TRACT | Status: DC | PRN
Start: 2024-04-27 — End: 2024-05-03
  Administered 2024-04-27 – 2024-05-01 (×3): 3 mL via RESPIRATORY_TRACT
  Filled 2024-04-27 (×3): qty 3

## 2024-04-27 MED ORDER — METHOCARBAMOL 750 MG PO TABS
750.0000 mg | ORAL_TABLET | Freq: Three times a day (TID) | ORAL | Status: DC
  Administered 2024-04-27 – 2024-05-03 (×18): 750 mg via ORAL
  Filled 2024-04-27 (×18): qty 1

## 2024-04-27 NOTE — Progress Notes (Signed)
 [facility]  PROGRESS NOTE  Corey Tate FMW:[id number] DOB: 1941-04-02 DOA: 04/15/2024 PCP: Silvano Angeline FALCON, NP   Brief HPI:   83 year old male with medical history of chronic atrial fibrillation not on anticoagulation, hypertension, severe COPD, PVD status post left CEA and right EIA angioplasty, active smoker, history of lung cancer on radiation, OSA on 2 L of oxygen , not on CPAP presented with severe back pain.  In the ED CTA chest abdomen/pelvis shows diffuse atherosclerotic disease but no dissection and no bony changes to thoracic or lumbar spine. Patient required 3 doses of IV opioids and was admitted to the hospital for pain control.  Neurosurgery was consulted    Assessment/Plan:   Intractable back pain - Presented with worsening back pain - MRI of lumbar spine showed L4-5, new left foraminal disc extrusion with severe increased left neuroforaminal stenosis. - Neurosurgery reviewed MRI and recommended pain control, steroids, if pain not controlled plan for left L4-5 transforaminal epidural steroid injection -Continues to have back pain, will increase oxycodone  to 10 mg every 4 hours as needed, increase gabapentin  to 300 mg p.o. twice daily.  Will make Robaxin  750 mg p.o. 3 times daily, scheduled.  Continue Lidoderm  patch - Patient has been on aspirin  and Plavix  which need to be held for 5 days - Aspirin  Plavix  held on 10/29; has been more than 5 days since aspirin  and Plavix  were held.  IR consulted - Plan for epidural steroid injection in a.m.  CAD involving native coronary artery with angina pectoris  Peripheral vascular disease/hypertension - Continue amlodipine , Zetia , pravastatin  - Aspirin  and Plavix  on hold as above  Obstructive sleep apnea - Not on CPAP  Cancer of upper lobe of right lung - Receiving radiation treatment - Alk phos is normal - CT ruled out osseous metastasis  Paroxysmal atrial fibrillation - Not on anticoagulation - Rate is controlled,  continue telemetry  Severe COPD Chronic respiratory failure with hypoxemia -Continue home oxygen  2 L/min - Continue Breztri      DVT prophylaxis: Lovenox  Medications     acetaminophen   1,000 mg Oral TID   amLODipine   5 mg Oral QODAY   bisacodyl  10 mg Rectal Once   budesonide-glycopyrrolate -formoterol   2 puff Inhalation BID   docusate sodium   100 mg Oral BID   enoxaparin (LOVENOX) injection  40 mg Subcutaneous Q24H   ezetimibe   10 mg Oral Daily   feeding supplement  237 mL Oral BID BM   gabapentin   300 mg Oral TID   lidocaine   1 patch Transdermal Q24H   methocarbamol   750 mg Oral TID   pantoprazole   20 mg Oral Daily   pravastatin   40 mg Oral Daily   senna  2 tablet Oral QHS     Data Reviewed:   CBG:  No results for input(s): GLUCAP in the last 168 hours.  SpO2: 95 % O2 Flow Rate (L/min): 2 L/min FiO2 (%): 28 %    Vitals:   04/26/24 2030 04/27/24 0016 04/27/24 0440 04/27/24 0745  BP:  (!) 114/53 111/66 133/68  Pulse:  61 (!) 58 65  Resp:  19 19   Temp:  98.5 F (36.9 C) 98.7 F (37.1 C) 97.9 F (36.6 C)  TempSrc:   Oral Oral  SpO2: 96% 96% 96% 95%  Weight:      Height:          Data Reviewed:  Basic Metabolic Panel: Recent Labs  Lab 04/22/24 0414 04/23/24 0554 04/27/24 0338  NA 134*  --  136  K 4.7  --  4.2  CL 98  --  98  CO2 27  --  29  GLUCOSE 196*  --  118*  BUN 42*  --  32*  CREATININE 1.22 1.02 1.06  CALCIUM 9.3  --  9.2  MG  --   --  2.4    CBC: Recent Labs  Lab 04/22/24 0414 04/27/24 0338  WBC 8.8 10.5  HGB 13.2 12.5*  HCT 39.0 35.9*  MCV 95.8 94.0  PLT 193 186    LFT Recent Labs  Lab 04/22/24 0414 04/27/24 0338  AST 19 26  ALT 17 37  ALKPHOS 48 46  BILITOT 0.5 0.6  PROT 6.7 6.3*  ALBUMIN  3.2* 3.2*     Antibiotics: Anti-infectives (From admission, onward)    Start     Dose/Rate Route Frequency Ordered Stop   04/17/24 2000  azithromycin (ZITHROMAX) tablet 500 mg        500 mg Oral Every 24 hours  04/17/24 1413 04/19/24 1957   04/16/24 2000  cefTRIAXone (ROCEPHIN) 2 g in sodium chloride  0.9 % 100 mL IVPB        2 g 200 mL/hr over 30 Minutes Intravenous Every 24 hours 04/16/24 0037 04/19/24 2028   04/16/24 2000  azithromycin (ZITHROMAX) 500 mg in sodium chloride  0.9 % 250 mL IVPB  Status:  Discontinued        500 mg 250 mL/hr over 60 Minutes Intravenous Every 24 hours 04/16/24 0037 04/17/24 1413   04/15/24 2145  cefTRIAXone (ROCEPHIN) 1 g in sodium chloride  0.9 % 100 mL IVPB        1 g 200 mL/hr over 30 Minutes Intravenous  Once 04/15/24 2144 04/16/24 0028   04/15/24 2145  azithromycin (ZITHROMAX) 500 mg in sodium chloride  0.9 % 250 mL IVPB        500 mg 250 mL/hr over 60 Minutes Intravenous  Once 04/15/24 2144 04/16/24 0028        CONSULTS neurosurgery  Code Status: Full code  Family Communication: No family at bedside     Subjective   Continues to complain of back pain   Objective    Physical Examination:   General-appears in no acute distress Heart-S1-S2, regular, no murmur auscultated Lungs-clear to auscultation bilaterally, no wheezing or crackles auscultated Abdomen-soft, nontender, no organomegaly Extremities-no edema in the lower extremities Neuro-alert, oriented x3, no focal deficit noted   Status is: Inpatient:             Sabas GORMAN Brod   [facility] If 7PM-7AM, please contact night-coverage at [email or web address], Office  [phone]   04/27/2024, 1:19 PM  LOS: 11 days

## 2024-04-27 NOTE — Progress Notes (Signed)
 Occupational Therapy Treatment Patient Details Name: Corey Tate MRN: [id number] DOB: 04-23-1941 Today's Date: 04/27/2024   History of present illness 83 yo admitted 10/24 with unrelenting back pain.  PMH is significant for HTN, severe COPD, PVD, s/p CEA and R EIA angioplasty, active smoker, hx of lung Ca on radiation, OSA on 2L O2 no CPAP, CKD,  6 previous back surgeries, RTHA and R TSA   OT comments  Patient received in supine and premedicated for pain. Patient agreeable to work with OT but continues to be limited by pain. Patient able to perform grooming and UB dressing seated on EOB.  Patient performed mobility and transfer training with rollator walker but with limited OOB tolerance due to pain and returned to sidelying.  Discharge recommendations changed from [facility] to inpatient follow up therapy, <3 hours/day due to unable to return home due to limited activity tolerance and pain.  Acute OT to continue to follow to address established goals to facilitate DC to next venue of care.         If plan is discharge home, recommend the following:  A little help with bathing/dressing/bathroom;Assistance with cooking/housework;Assist for transportation;Help with stairs or ramp for entrance   Equipment Recommendations  None recommended by OT    Recommendations for Other Services      Precautions / Restrictions Precautions Precautions: Fall Recall of Precautions/Restrictions: Intact Restrictions Weight Bearing Restrictions Per Provider Order: No       Mobility Bed Mobility Overal bed mobility: Modified Independent                  Transfers Overall transfer level: Needs assistance Equipment used: Rollator (4 wheels) Transfers: Sit to/from Stand Sit to Stand: Contact guard assist           General transfer comment: CGA but limited due to pain     Balance Overall balance assessment: Needs assistance Sitting-balance support: Feet supported Sitting balance-Leahy Scale:  Good Sitting balance - Comments: EOB   Standing balance support: Bilateral upper extremity supported Standing balance-Leahy Scale: Poor Standing balance comment: stands with flexed posture, UE support due to pain                           ADL either performed or assessed with clinical judgement   ADL Overall ADL's : Needs assistance/impaired     Grooming: Set up;Sitting           Upper Body Dressing : Set up;Sitting Upper Body Dressing Details (indicate cue type and reason): EOB     Toilet Transfer: Contact guard assist;Rollator (4 wheels) Toilet Transfer Details (indicate cue type and reason): simulated           General ADL Comments: Patient limited by pain    Extremity/Trunk Assessment              Vision       Perception     Praxis     Communication Communication Communication: Impaired Factors Affecting Communication: Hearing impaired;Reduced clarity of speech   Cognition Arousal: Alert Behavior During Therapy: WFL for tasks assessed/performed Cognition: No apparent impairments                               Following commands: Intact        Cueing   Cueing Techniques: Verbal cues  Exercises      Shoulder Instructions  General Comments      Pertinent Vitals/ Pain       Pain Assessment Pain Assessment: Faces Faces Pain Scale: Hurts even more Pain Location: back radiating to LLE Pain Descriptors / Indicators: Aching, Discomfort, Radiating, Numbness Pain Intervention(s): Limited activity within patient's tolerance, Monitored during session, Premedicated before session, Repositioned  Home Living                                          Prior Functioning/Environment              Frequency  Min 1X/week        Progress Toward Goals  OT Goals(current goals can now be found in the care plan section)  Progress towards OT goals: Progressing toward goals  Acute Rehab OT  Goals Patient Stated Goal: less pain OT Goal Formulation: With patient Time For Goal Achievement: 04/30/24 Potential to Achieve Goals: Fair ADL Goals Pt Will Perform Grooming: with supervision;standing Pt Will Perform Lower Body Bathing: with supervision;sit to/from stand Pt Will Perform Lower Body Dressing: with supervision;sit to/from stand Pt Will Transfer to Toilet: with supervision;ambulating  Plan      Co-evaluation                 AM-PAC OT 6 Clicks Daily Activity     Outcome Measure   Help from another person eating meals?: A Little Help from another person taking care of personal grooming?: A Little Help from another person toileting, which includes using toliet, bedpan, or urinal?: A Lot Help from another person bathing (including washing, rinsing, drying)?: A Lot Help from another person to put on and taking off regular upper body clothing?: A Little Help from another person to put on and taking off regular lower body clothing?: A Lot 6 Click Score: 15    End of Session Equipment Utilized During Treatment: Rollator (4 wheels);Gait belt  OT Visit Diagnosis: Unsteadiness on feet (R26.81);Pain Pain - Right/Left: Left Pain - part of body: Leg (back)   Activity Tolerance Patient limited by pain   Patient Left in bed;with call bell/phone within reach;with bed alarm set   Nurse Communication Mobility status        Time: 9065-9051 OT Time Calculation (min): 14 min  Charges: OT General Charges $OT Visit: 1 Visit OT Treatments $Self Care/Home Management : 8-22 mins  Dick Laine, OTA Acute Rehabilitation Services  Office [phone]   Jeb LITTIE Laine 04/27/2024, 1:40 PM

## 2024-04-27 NOTE — Plan of Care (Signed)
 Patient calm and cooperative, daughter at bedside. Daughter left a note on bedside table for dayshift nurse and doctor. Patient left with call bell in reach, bed in lowest position and side rails up.   Problem: Education: Goal: Knowledge of General Education information will improve Description: Including pain rating scale, medication(s)/side effects and non-pharmacologic comfort measures Outcome: Progressing   Problem: Clinical Measurements: Goal: Ability to maintain clinical measurements within normal limits will improve Outcome: Progressing   Problem: Activity: Goal: Risk for activity intolerance will decrease Outcome: Progressing   Problem: Nutrition: Goal: Adequate nutrition will be maintained Outcome: Progressing   Problem: Coping: Goal: Level of anxiety will decrease Outcome: Progressing   Problem: Elimination: Goal: Will not experience complications related to bowel motility Outcome: Progressing   Problem: Safety: Goal: Ability to remain free from injury will improve Outcome: Progressing   Problem: Activity: Goal: Ability to tolerate increased activity will improve Outcome: Progressing   Problem: Respiratory: Goal: Ability to maintain adequate ventilation will improve Outcome: Progressing

## 2024-04-27 NOTE — TOC Initial Note (Signed)
 Transition of Care [facility]) - Initial/Assessment Note    Patient Details  Name: Corey Tate MRN: [id number] Date of Birth: [birth year]  Transition of Care [facility]) CM/SW Contact:    Sherline Clack, LCSWA Phone Number: 04/27/2024, 10:14 AM  Clinical Narrative:                  CSW spoke with Randine at [facility] regarding a bed offer for patient. Both [facility] facilities are currently full. CSW spoke with daughter/Lisa about the bed availability at [facility], and Olam informed CSW she had also spoken to [facility] and had been told there was a possible discharge later in the week. CSW provided family with a list of facilities with Medicare ratings to review in case a bed does not open at [facility]. CSW will continue to follow and update DC plan.   Expected Discharge Plan: Skilled Nursing Facility Barriers to Discharge: No SNF bed   Patient Goals and CMS Choice     Choice offered to / list presented to : Patient, Adult Children, Spouse      Expected Discharge Plan and Services       Living arrangements for the past 2 months: Single Family Home                                      Prior Living Arrangements/Services Living arrangements for the past 2 months: Single Family Home Lives with:: Spouse Patient language and need for interpreter reviewed:: Yes                 Activities of Daily Living   ADL Screening (condition at time of admission) Independently performs ADLs?: Yes (appropriate for developmental age) Is the patient deaf or have difficulty hearing?: No Does the patient have difficulty seeing, even when wearing glasses/contacts?: No Does the patient have difficulty concentrating, remembering, or making decisions?: No  Permission Sought/Granted                  Emotional Assessment   Attitude/Demeanor/Rapport: Engaged Affect (typically observed): Appropriate Orientation: : Oriented to Self, Oriented to Place, Oriented to  Time, Oriented to  Situation      Admission diagnosis:  Back pain [M54.9] Muscle spasm [M62.838] Pneumonia of right lower lobe due to infectious organism [J18.9] Acute back pain, unspecified back location, unspecified back pain laterality [M54.9] Patient Active Problem List   Diagnosis Date Noted   Protein-calorie malnutrition, severe 04/18/2024   Back pain 04/15/2024   OSA (obstructive sleep apnea) 04/15/2024   Abdominal aortic aneurysm (AAA) without rupture 05/28/2023   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) 05/28/2023   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   S/P lumbar laminectomy 10/21/2022   S/P lumbar fusion 10/20/2022   Neck mass 07/15/2022   Sebaceous cyst 07/15/2022   Shortness of breath    Hypertension    GERD (gastroesophageal reflux disease)    Cancer (HCC)    CAD (coronary artery disease)    Asthma    HTN (hypertension) 02/08/2020   CKD (chronic kidney disease), stage II 02/08/2020   Abnormal cardiac CT angiography 01/31/2020   Coronary artery disease involving native coronary artery of native heart with angina pectoris 01/31/2020   DOE (dyspnea on exertion)  - as Angina Equivalent 01/31/2020   Pneumonia 2021   Chronic anticoagulation 12/31/2017   Anxiety 12/11/2017   Arthritis 12/11/2017   Carotid bruit 12/11/2017   Hypertensive  chronic kidney disease 12/11/2017   H/O varicella 12/11/2017   Paroxysmal atrial fibrillation (HCC) 06/29/2017   Atrial flutter by electrocardiogram (HCC) 12/10/2016   History of atrial fibrillation 12/10/2016   Syncope 08/04/2016   COPD, severe (HCC) 03/04/2016   Cough 03/04/2016   Nocturnal hypoxemia 03/04/2016   Smoking greater than 40 pack years 03/04/2016   Atherosclerosis of native artery of both lower extremities 01/31/2016   Bilateral carotid artery stenosis 01/31/2016   Dyslipidemia 09/26/2015   Nicotine dependence, uncomplicated 09/26/2015   Peripheral vascular disease 09/26/2015   Lumbar spondylosis 11/06/2014    Spondylolisthesis 10/16/2014   Spinal stenosis of lumbar region 10/02/2014   Chronic back pain greater than 3 months duration 10/02/2014   Degenerative arthritis of hip 04/02/2012   PCP:  Silvano Angeline FALCON, NP Pharmacy:   [facility] [address], [place] - [address] [facility] [place] [address] Phone: [phone] Fax: [phone]     Social Drivers of Health (SDOH) Social History: SDOH Screenings   Food Insecurity: No Food Insecurity (04/16/2024)  Housing: Low Risk  (04/16/2024)  Transportation Needs: No Transportation Needs (04/16/2024)  Utilities: Not At Risk (04/16/2024)  Social Connections: Unknown (04/16/2024)  Tobacco Use: High Risk (04/16/2024)   SDOH Interventions:     Readmission Risk Interventions    04/18/2024   11:29 AM  Readmission Risk Prevention Plan  Post Dischage Appt Complete  Medication Screening Complete  Transportation Screening Complete

## 2024-04-27 NOTE — Progress Notes (Signed)
 Physical Therapy Treatment Patient Details Name: Corey Tate MRN: [id number] DOB: 01-Jul-1940 Today's Date: 04/27/2024   History of Present Illness 83 yo admitted 10/24 with unrelenting back pain.  PMH is significant for HTN, severe COPD, PVD, s/p CEA and R EIA angioplasty, active smoker, hx of lung Ca on radiation, OSA on 2L O2 no CPAP, CKD,  6 previous back surgeries, RTHA and R TSA    PT Comments  Patient progressing a little seems tolerated more distance and longer time upright prior to onset of pain so bad has to lay on his side.  Patient not too hopeful for improvement from procedure planned in am.  PT will continue to follow.     If plan is discharge home, recommend the following: A little help with walking and/or transfers;A little help with bathing/dressing/bathroom;Assistance with cooking/housework;Help with stairs or ramp for entrance;Assist for transportation   Can travel by private vehicle     No  Equipment Recommendations  None recommended by PT    Recommendations for Other Services       Precautions / Restrictions Precautions Precautions: Fall Recall of Precautions/Restrictions: Intact Precaution/Restrictions Comments: 2 L O2     Mobility  Bed Mobility Overal bed mobility: Modified Independent Bed Mobility: Sidelying to Sit   Sidelying to sit: Used rails, Modified independent (Device/Increase time)     Sit to sidelying: Modified independent (Device/Increase time) General bed mobility comments: able to get to EOB and back to supine without assistance    Transfers Overall transfer level: Needs assistance Equipment used: Rollator (4 wheels) Transfers: Sit to/from Stand Sit to Stand: Contact guard assist           General transfer comment: assist for balance    Ambulation/Gait Ambulation/Gait assistance: Contact guard assist Gait Distance (Feet): 50 Feet Assistive device: Rollator (4 wheels) Gait Pattern/deviations: Step-through pattern, Decreased  stride length, Trunk flexed, Antalgic       General Gait Details: initially moving well, with increased distance noted antalgia on L and more flexed posture   Stairs             Wheelchair Mobility     Tilt Bed    Modified Rankin (Stroke Patients Only)       Balance Overall balance assessment: Needs assistance   Sitting balance-Leahy Scale: Good Sitting balance - Comments: able to sit EOB about 1 minute prior to needing to return to sidelying on R   Standing balance support: Bilateral upper extremity supported Standing balance-Leahy Scale: Poor Standing balance comment: stands with flexed posture, UE support due to pain                            Communication Communication Communication: Impaired Factors Affecting Communication: Hearing impaired;Reduced clarity of speech  Cognition Arousal: Alert Behavior During Therapy: Anxious   PT - Cognitive impairments: No apparent impairments                                Cueing Cueing Techniques: Verbal cues  Exercises      General Comments General comments (skin integrity, edema, etc.): pt relates may be going for epidural steriod injection tomorrow (states has not helped in the past)      Pertinent Vitals/Pain Pain Assessment Faces Pain Scale: Hurts whole lot Pain Location: L SI area radiating to L LE Pain Descriptors / Indicators: Aching, Discomfort, Radiating, Numbness Pain Intervention(s): Monitored  during session, Repositioned, Heat applied    Home Living                          Prior Function            PT Goals (current goals can now be found in the care plan section) Progress towards PT goals: Progressing toward goals    Frequency    Min 2X/week      PT Plan      Co-evaluation              AM-PAC PT 6 Clicks Mobility   Outcome Measure  Help needed turning from your back to your side while in a flat bed without using bedrails?: None Help  needed moving from lying on your back to sitting on the side of a flat bed without using bedrails?: None Help needed moving to and from a bed to a chair (including a wheelchair)?: A Little Help needed standing up from a chair using your arms (e.g., wheelchair or bedside chair)?: A Little Help needed to walk in hospital room?: A Little Help needed climbing 3-5 steps with a railing? : Total 6 Click Score: 18    End of Session Equipment Utilized During Treatment: Gait belt Activity Tolerance: Patient limited by pain Patient left: in bed;with call bell/phone within reach   PT Visit Diagnosis: Other abnormalities of gait and mobility (R26.89);Muscle weakness (generalized) (M62.81);Difficulty in walking, not elsewhere classified (R26.2);Pain Pain - Right/Left: Left Pain - part of body: Hip;Leg     Time: 1555-1610 PT Time Calculation (min) (ACUTE ONLY): 15 min  Charges:    $Gait Training: 8-22 mins PT General Charges $$ ACUTE PT VISIT: 1 Visit                     Micheline Tate, PT Acute Rehabilitation Services Office:[phone] 04/27/2024    Corey Tate 04/27/2024, 5:54 PM

## 2024-04-28 DIAGNOSIS — M5440 Lumbago with sciatica, unspecified side: Secondary | ICD-10-CM | POA: Diagnosis not present

## 2024-04-28 DIAGNOSIS — I25119 Atherosclerotic heart disease of native coronary artery with unspecified angina pectoris: Secondary | ICD-10-CM | POA: Diagnosis not present

## 2024-04-28 DIAGNOSIS — M549 Dorsalgia, unspecified: Secondary | ICD-10-CM | POA: Diagnosis not present

## 2024-04-28 DIAGNOSIS — C3411 Malignant neoplasm of upper lobe, right bronchus or lung: Secondary | ICD-10-CM | POA: Diagnosis not present

## 2024-04-28 MED ORDER — FAMOTIDINE 20 MG PO TABS
20.0000 mg | ORAL_TABLET | Freq: Once | ORAL | Status: AC
  Administered 2024-04-28: 20 mg via ORAL
  Filled 2024-04-28: qty 1

## 2024-04-28 MED ORDER — PANTOPRAZOLE SODIUM 40 MG PO TBEC
40.0000 mg | DELAYED_RELEASE_TABLET | Freq: Every day | ORAL | Status: DC
  Administered 2024-04-29 – 2024-05-03 (×4): 40 mg via ORAL
  Filled 2024-04-28 (×4): qty 1

## 2024-04-28 MED ORDER — KETOROLAC TROMETHAMINE 15 MG/ML IJ SOLN
15.0000 mg | Freq: Three times a day (TID) | INTRAMUSCULAR | Status: DC
  Administered 2024-04-28 – 2024-05-02 (×14): 15 mg via INTRAVENOUS
  Filled 2024-04-28 (×15): qty 1

## 2024-04-28 MED ORDER — ACETAMINOPHEN 325 MG PO TABS: 650.0000 mg | ORAL_TABLET | Freq: Four times a day (QID) | ORAL | Status: DC | PRN

## 2024-04-28 NOTE — Progress Notes (Signed)
 Mobility Specialist: Progress Note   04/28/24 1500  Mobility  Activity Ambulated with assistance  Level of Assistance Contact guard assist, steadying assist  Assistive Device Four wheel walker  Distance Ambulated (ft) 50 ft  Activity Response Tolerated well  Mobility Referral Yes  Mobility visit 1 Mobility  Mobility Specialist Start Time (ACUTE ONLY) 1455  Mobility Specialist Stop Time (ACUTE ONLY) 1509  Mobility Specialist Time Calculation (min) (ACUTE ONLY) 14 min    Pt received in bed, agreeable to mobility session. Feeling much better today. CGA throughout w/ rollator. Returned to EOB and tolerate sitting for ~5 min. SpO2 90% on RA. Returned to supine at EOS. Left in bed with all needs met, call bell in reach.   Ileana Lute Mobility Specialist Please contact via SecureChat or Rehab office at [phone]

## 2024-04-28 NOTE — Progress Notes (Signed)
 [facility]  PROGRESS NOTE  Corey Tate FMW:[id number] DOB: 04-12-1941 DOA: 04/15/2024 PCP: Silvano Angeline FALCON, NP   Brief HPI:   83 year old male with medical history of chronic atrial fibrillation not on anticoagulation, hypertension, severe COPD, PVD status post left CEA and right EIA angioplasty, active smoker, history of lung cancer on radiation, OSA on 2 L of oxygen , not on CPAP presented with severe back pain.  In the ED CTA chest abdomen/pelvis shows diffuse atherosclerotic disease but no dissection and no bony changes to thoracic or lumbar spine. Patient required 3 doses of IV opioids and was admitted to the hospital for pain control.  Neurosurgery was consulted    Assessment/Plan:   Cough - Patient has been coughing intermittently - He is at risk of aspiration as he is eating while laying in the bed on his right side - I explained to the patient that he should try to sit up and eat; but patient is adamant to eat while laying in the bed, as sitting up worsens his back pain - Currently not requiring oxygen  - Will continue to monitor   Intractable back pain - Presented with worsening back pain - MRI of lumbar spine showed L4-5, new left foraminal disc extrusion with severe increased left neuroforaminal stenosis. - Neurosurgery reviewed MRI and recommended pain control, steroids, if pain not controlled plan for left L4-5 transforaminal epidural steroid injection -Continues to have back pain, dose of  oxycodone  increased to 10 mg every 4 hours as needed, increased gabapentin  to 300 mg p.o. twice daily.  Continue Robaxin  750 mg p.o. 3 times daily, scheduled.  Continue Lidoderm  patch - Patient has been on aspirin  and Plavix  which need to be held for 5 days - Aspirin  Plavix  held on 10/29; has been more than 5 days since aspirin  and Plavix  were held.  IR consulted -Patient was supposed to get epidural steroid injection today however IR PA called and said that patient does not have  adequate epidural fat for the injection.  He will be referred to outpatient neuroradiology for epidural steroid injection. -I will start patient on Toradol  15 mg IV every 8 hours - A referral has been made for outpatient neuroradiology for [facility]  CAD involving native coronary artery with angina pectoris  Peripheral vascular disease/hypertension - Continue amlodipine , Zetia , pravastatin  - Aspirin  and Plavix  were held for [facility] as above - Will restart his medications as patient is no longer going to have steroid injection in the hospital  Obstructive sleep apnea - Not on CPAP  Cancer of upper lobe of right lung - Receiving radiation treatment - Alk phos is normal - CT ruled out osseous metastasis  Paroxysmal atrial fibrillation - Not on anticoagulation - Rate is controlled, continue telemetry  Severe COPD Chronic respiratory failure with hypoxemia -Continue home oxygen  2 L/min - Continue Breztri      DVT prophylaxis: Lovenox  Medications     acetaminophen   1,000 mg Oral TID   bisacodyl  10 mg Rectal Once   budesonide-glycopyrrolate -formoterol   2 puff Inhalation BID   docusate sodium   100 mg Oral BID   enoxaparin (LOVENOX) injection  40 mg Subcutaneous Q24H   ezetimibe   10 mg Oral Daily   feeding supplement  237 mL Oral BID BM   gabapentin   300 mg Oral TID   lidocaine   1 patch Transdermal Q24H   methocarbamol   750 mg Oral TID   pantoprazole   20 mg Oral Daily   pravastatin   40 mg Oral Daily  senna  2 tablet Oral QHS     Data Reviewed:   CBG:  No results for input(s): GLUCAP in the last 168 hours.  SpO2: 97 % O2 Flow Rate (L/min): 3 L/min FiO2 (%): 28 %    Vitals:   04/27/24 2356 04/28/24 0546 04/28/24 0813 04/28/24 0833  BP: (!) 107/48 (!) 101/44 (!) 116/53   Pulse: (!) 54 (!) 56 (!) 56 60  Resp: 19 19 18 18   Temp: 98.4 F (36.9 C) 97.8 F (36.6 C) 97.9 F (36.6 C)   TempSrc: Oral Oral    SpO2: 98% 97% 97%   Weight:      Height:          Data  Reviewed:  Basic Metabolic Panel: Recent Labs  Lab 04/22/24 0414 04/23/24 0554 04/27/24 0338  NA 134*  --  136  K 4.7  --  4.2  CL 98  --  98  CO2 27  --  29  GLUCOSE 196*  --  118*  BUN 42*  --  32*  CREATININE 1.22 1.02 1.06  CALCIUM 9.3  --  9.2  MG  --   --  2.4    CBC: Recent Labs  Lab 04/22/24 0414 04/27/24 0338  WBC 8.8 10.5  HGB 13.2 12.5*  HCT 39.0 35.9*  MCV 95.8 94.0  PLT 193 186    LFT Recent Labs  Lab 04/22/24 0414 04/27/24 0338  AST 19 26  ALT 17 37  ALKPHOS 48 46  BILITOT 0.5 0.6  PROT 6.7 6.3*  ALBUMIN  3.2* 3.2*     Antibiotics: Anti-infectives (From admission, onward)    Start     Dose/Rate Route Frequency Ordered Stop   04/17/24 2000  azithromycin (ZITHROMAX) tablet 500 mg        500 mg Oral Every 24 hours 04/17/24 1413 04/19/24 1957   04/16/24 2000  cefTRIAXone (ROCEPHIN) 2 g in sodium chloride  0.9 % 100 mL IVPB        2 g 200 mL/hr over 30 Minutes Intravenous Every 24 hours 04/16/24 0037 04/19/24 2028   04/16/24 2000  azithromycin (ZITHROMAX) 500 mg in sodium chloride  0.9 % 250 mL IVPB  Status:  Discontinued        500 mg 250 mL/hr over 60 Minutes Intravenous Every 24 hours 04/16/24 0037 04/17/24 1413   04/15/24 2145  cefTRIAXone (ROCEPHIN) 1 g in sodium chloride  0.9 % 100 mL IVPB        1 g 200 mL/hr over 30 Minutes Intravenous  Once 04/15/24 2144 04/16/24 0028   04/15/24 2145  azithromycin (ZITHROMAX) 500 mg in sodium chloride  0.9 % 250 mL IVPB        500 mg 250 mL/hr over 60 Minutes Intravenous  Once 04/15/24 2144 04/16/24 0028        CONSULTS neurosurgery  Code Status: Full code  Family Communication: Discussed with patient's daughter Olam on phone     Subjective    Patient seen and examined.  Continues to have back pain.  IR was supposed to do epidural steroid injection however IR called that they cannot do epidural steroid injection as patient does not have adequate epidural fat.  Has been coughing up  intermittently.  Patient has been eating while laying in the bed.  Objective    Physical Examination:  Appears in no acute distress Lungs are clear to auscultation bilaterally Abdomen is soft, nontender, no organomegaly Extremities no edema    Status is: Inpatient:  Sabas GORMAN Brod   [facility] If 7PM-7AM, please contact night-coverage at [email or web address], Office  [phone]   04/28/2024, 8:45 AM  LOS: 12 days

## 2024-04-28 NOTE — Plan of Care (Signed)

## 2024-04-29 DIAGNOSIS — M549 Dorsalgia, unspecified: Secondary | ICD-10-CM | POA: Diagnosis not present

## 2024-04-29 DIAGNOSIS — M5440 Lumbago with sciatica, unspecified side: Secondary | ICD-10-CM | POA: Diagnosis not present

## 2024-04-29 DIAGNOSIS — I25119 Atherosclerotic heart disease of native coronary artery with unspecified angina pectoris: Secondary | ICD-10-CM | POA: Diagnosis not present

## 2024-04-29 DIAGNOSIS — M62838 Other muscle spasm: Secondary | ICD-10-CM | POA: Diagnosis not present

## 2024-04-29 MED ORDER — ASPIRIN 81 MG PO TBEC
81.0000 mg | DELAYED_RELEASE_TABLET | Freq: Every day | ORAL | Status: DC
  Administered 2024-04-29: 81 mg via ORAL
  Filled 2024-04-29: qty 1

## 2024-04-29 MED ORDER — CLOPIDOGREL BISULFATE 75 MG PO TABS
75.0000 mg | ORAL_TABLET | Freq: Every day | ORAL | Status: DC
  Administered 2024-04-29: 75 mg via ORAL
  Filled 2024-04-29: qty 1

## 2024-04-29 NOTE — Evaluation (Signed)
 RT Evaluate and Treat Note  04/29/2024   Breathing is (select one): Same as normal    The following was found on auscultation (select multiple):  Bilateral Breath Sounds: Diminished (04/29/24 0745)  R Upper  Breath Sounds: Clear (04/24/24 2200) L Upper Breath Sounds: Clear (04/24/24 2200) R Lower Breath Sounds: Diminished (04/24/24 2200) L Lower Breath Sounds: Diminished (04/24/24 2200)    Cough Assessment: Cough: Non-productive (04/28/24 0833)    Most Recent Chest Xray:... (No results found.    The following medications and/or interventions were ordered/changed/discontinued as part of the Respiratory Treatment protocol:   Medication Changes: None   Airway Clearance Changes: None   Oxygen  Therapy Changes: None

## 2024-04-29 NOTE — Progress Notes (Signed)
 Subjective: Patient reports he feels the best he's felt in a while but still having back and leg pain   Objective: Vital signs in last 24 hours: Temp:  [97.9 F (36.6 C)-98.5 F (36.9 C)] 98.5 F (36.9 C) (11/07 1200) Pulse Rate:  [52-81] 81 (11/07 1200) Resp:  [18-20] 19 (11/07 1200) BP: (97-117)/(44-66) 97/55 (11/07 1200) SpO2:  [92 %-99 %] 94 % (11/07 1200) FiO2 (%):  [32 %] 32 % (11/06 2007)  Intake/Output from previous day: No intake/output data recorded. Intake/Output this shift: Total I/O In: -  Out: 450 [Urine:450]  Neurologic: Grossly normal  Lab Results: Lab Results  Component Value Date   WBC 10.5 04/27/2024   HGB 12.5 (L) 04/27/2024   HCT 35.9 (L) 04/27/2024   MCV 94.0 04/27/2024   PLT 186 04/27/2024   Lab Results  Component Value Date   INR 1.0 10/14/2022   BMET Lab Results  Component Value Date   NA 136 04/27/2024   K 4.2 04/27/2024   CL 98 04/27/2024   CO2 29 04/27/2024   GLUCOSE 118 (H) 04/27/2024   BUN 32 (H) 04/27/2024   CREATININE 1.06 04/27/2024   CALCIUM 9.2 04/27/2024    Studies/Results: No results found.  Assessment/Plan: Lumbar radiculopathy with uncontrolled pain. He is on the operated schedule for Monday afternoon. Please keep NPO after midnight Sunday night.    LOS: 13 days    Suzen Lacks Uldine Fuster 04/29/2024, 1:26 PM

## 2024-04-29 NOTE — Progress Notes (Signed)
 Physical Therapy Treatment Patient Details Name: Corey Tate MRN: [id number] DOB: March 30, 1941 Today's Date: 04/29/2024   History of Present Illness 83 yo admitted 10/24 with unrelenting back pain.  Plan for surgery 11/10. PMH is significant for HTN, severe COPD, PVD, s/p CEA and R EIA angioplasty, active smoker, hx of lung Ca on radiation, OSA on 2L O2 no CPAP (pt endorsing O2 at night only), CKD,  6 previous back surgeries, RTHA and R TSA    PT Comments  Pt states his back feels better vs previously, but states he has had limited pain medication today secondary to hypotension earlier. Pt ambulatory for good hallway distance with use of rollator, cues for form and safety needed throughout. Pt with good functional application of log roll in/out of bed. PT to continue to see acutely.      If plan is discharge home, recommend the following: A little help with walking and/or transfers;A little help with bathing/dressing/bathroom;Assistance with cooking/housework;Help with stairs or ramp for entrance;Assist for transportation   Can travel by private vehicle     No  Equipment Recommendations  None recommended by PT    Recommendations for Other Services       Precautions / Restrictions Precautions Precautions: Fall Recall of Precautions/Restrictions: Intact Restrictions Weight Bearing Restrictions Per Provider Order: No     Mobility  Bed Mobility Overal bed mobility: Modified Independent Bed Mobility: Rolling, Sidelying to Sit, Sit to Sidelying Rolling: Supervision Sidelying to sit: Supervision     Sit to sidelying: Supervision General bed mobility comments: cues for log roll technique    Transfers Overall transfer level: Needs assistance Equipment used: Rollator (4 wheels) Transfers: Sit to/from Stand Sit to Stand: Contact guard assist           General transfer comment: for safety    Ambulation/Gait Ambulation/Gait assistance: Contact guard assist Gait Distance  (Feet): 100 Feet Assistive device: Rollator (4 wheels) Gait Pattern/deviations: Step-through pattern, Decreased stride length, Trunk flexed, Antalgic Gait velocity: decr     General Gait Details: cues for upright posture, proximity to [employer] Mobility     Tilt Bed    Modified Rankin (Stroke Patients Only)       Balance Overall balance assessment: Needs assistance Sitting-balance support: Feet supported Sitting balance-Leahy Scale: Good     Standing balance support: Bilateral upper extremity supported Standing balance-Leahy Scale: Poor Standing balance comment: reliant on UE support                            Communication Communication Communication: Impaired Factors Affecting Communication: Hearing impaired;Reduced clarity of speech  Cognition Arousal: Alert Behavior During Therapy: WFL for tasks assessed/performed   PT - Cognitive impairments: No apparent impairments                       PT - Cognition Comments: hyperverbose, follows cues well Following commands: Intact      Cueing Cueing Techniques: Verbal cues  Exercises      General Comments        Pertinent Vitals/Pain Pain Assessment Pain Assessment: Faces Faces Pain Scale: Hurts little more Pain Location: back, radiation to L hip and LE Pain Descriptors / Indicators: Aching, Discomfort, Grimacing, Radiating Pain Intervention(s): Limited activity within patient's tolerance, Monitored during session, Repositioned    Home Living  Prior Function            PT Goals (current goals can now be found in the care plan section) Acute Rehab PT Goals Patient Stated Goal: Reduce pain PT Goal Formulation: With patient Time For Goal Achievement: 04/30/24 Potential to Achieve Goals: Fair Progress towards PT goals: Progressing toward goals    Frequency    Min 2X/week      PT Plan      Co-evaluation               AM-PAC PT 6 Clicks Mobility   Outcome Measure  Help needed turning from your back to your side while in a flat bed without using bedrails?: A Little Help needed moving from lying on your back to sitting on the side of a flat bed without using bedrails?: A Little Help needed moving to and from a bed to a chair (including a wheelchair)?: A Little Help needed standing up from a chair using your arms (e.g., wheelchair or bedside chair)?: A Little Help needed to walk in hospital room?: A Little Help needed climbing 3-5 steps with a railing? : A Lot 6 Click Score: 17    End of Session Equipment Utilized During Treatment: Oxygen  (doffed for gait as pt requests this, states he wears when sleeping only and daughter agrees) Activity Tolerance: Patient limited by pain Patient left: in bed;with call bell/phone within reach;with bed alarm set Nurse Communication: Mobility status PT Visit Diagnosis: Other abnormalities of gait and mobility (R26.89);Muscle weakness (generalized) (M62.81);Difficulty in walking, not elsewhere classified (R26.2);Pain Pain - Right/Left: Left Pain - part of body: Hip;Leg     Time: 8399-8381 PT Time Calculation (min) (ACUTE ONLY): 18 min  Charges:    $Therapeutic Activity: 8-22 mins PT General Charges $$ ACUTE PT VISIT: 1 Visit                     Johana RAMAN, PT DPT Acute Rehabilitation Services Secure Chat Preferred  Office [phone]    Corey Tate 04/29/2024, 4:49 PM

## 2024-04-29 NOTE — Progress Notes (Signed)
 Occupational Therapy Treatment Patient Details Name: Corey Tate MRN: [id number] DOB: 12-Apr-1941 Today's Date: 04/29/2024   History of present illness 83 yo admitted 10/24 with unrelenting back pain.  PMH is significant for HTN, severe COPD, PVD, s/p CEA and R EIA angioplasty, active smoker, hx of lung Ca on radiation, OSA on 2L O2 no CPAP, CKD,  6 previous back surgeries, RTHA and R TSA   OT comments  Patient received in supine and stating he has less pain due to recent pain meds change. Patient able to sit on EOB to perform bathing and dressing but continues to only tolerate 5 minutes before requiring rest break in sidelying due to pain. Patient will benefit from continued inpatient follow up therapy, <3 hours/day.  Acute OT to continue to follow to address established goals to facilitate DC to next venue of care.        If plan is discharge home, recommend the following:  A little help with bathing/dressing/bathroom;Assistance with cooking/housework;Assist for transportation;Help with stairs or ramp for entrance   Equipment Recommendations  None recommended by OT    Recommendations for Other Services      Precautions / Restrictions Precautions Precautions: Fall Recall of Precautions/Restrictions: Intact       Mobility Bed Mobility Overal bed mobility: Modified Independent                  Transfers Overall transfer level: Needs assistance Equipment used: Rollator (4 wheels) Transfers: Sit to/from Stand Sit to Stand: Contact guard assist           General transfer comment: assist for balance     Balance Overall balance assessment: Needs assistance Sitting-balance support: Feet supported Sitting balance-Leahy Scale: Good Sitting balance - Comments: able to perform self care seated on EOB but requires rests in supine due to back pain   Standing balance support: Bilateral upper extremity supported Standing balance-Leahy Scale: Poor Standing balance comment:  reliant on UE support                           ADL either performed or assessed with clinical judgement   ADL Overall ADL's : Needs assistance/impaired     Grooming: Set up;Sitting Grooming Details (indicate cue type and reason): on EOB Upper Body Bathing: Sitting;Set up Upper Body Bathing Details (indicate cue type and reason): on EOB Lower Body Bathing: Minimal assistance;Sit to/from stand Lower Body Bathing Details (indicate cue type and reason): assistance for feet Upper Body Dressing : Set up;Sitting Upper Body Dressing Details (indicate cue type and reason): EOB Lower Body Dressing: Minimal assistance;Sit to/from stand Lower Body Dressing Details (indicate cue type and reason): assistance with socks               General ADL Comments: demonstrating gains with self care with decreased back pain    Extremity/Trunk Assessment              Vision       Perception     Praxis     Communication Communication Communication: Impaired Factors Affecting Communication: Hearing impaired;Reduced clarity of speech   Cognition Arousal: Alert Behavior During Therapy: Anxious Cognition: No apparent impairments                               Following commands: Intact        Cueing   Cueing Techniques: Verbal cues  Exercises  Shoulder Instructions       General Comments      Pertinent Vitals/ Pain       Pain Assessment Pain Assessment: Faces Faces Pain Scale: Hurts even more Pain Location: back, radiating to LLE Pain Descriptors / Indicators: Aching, Discomfort, Grimacing, Radiating Pain Intervention(s): Limited activity within patient's tolerance, Monitored during session, Repositioned, Premedicated before session, RN gave pain meds during session  Home Living                                          Prior Functioning/Environment              Frequency  Min 1X/week        Progress Toward  Goals  OT Goals(current goals can now be found in the care plan section)  Progress towards OT goals: Progressing toward goals  Acute Rehab OT Goals Patient Stated Goal: less back pain OT Goal Formulation: With patient Time For Goal Achievement: 04/30/24 Potential to Achieve Goals: Fair ADL Goals Pt Will Perform Grooming: with supervision;standing Pt Will Perform Lower Body Bathing: with supervision;sit to/from stand Pt Will Perform Lower Body Dressing: with supervision;sit to/from stand Pt Will Transfer to Toilet: with supervision;ambulating  Plan      Co-evaluation                 AM-PAC OT 6 Clicks Daily Activity     Outcome Measure   Help from another person eating meals?: A Little Help from another person taking care of personal grooming?: A Little Help from another person toileting, which includes using toliet, bedpan, or urinal?: A Little Help from another person bathing (including washing, rinsing, drying)?: A Little Help from another person to put on and taking off regular upper body clothing?: A Little Help from another person to put on and taking off regular lower body clothing?: A Little 6 Click Score: 18    End of Session Equipment Utilized During Treatment: Rollator (4 wheels);Gait belt  OT Visit Diagnosis: Unsteadiness on feet (R26.81);Pain Pain - Right/Left: Left Pain - part of body: Leg (back)   Activity Tolerance Patient limited by pain   Patient Left in bed;with call bell/phone within reach;with bed alarm set   Nurse Communication Mobility status        Time: 8760-8684 OT Time Calculation (min): 36 min  Charges: OT General Charges $OT Visit: 1 Visit OT Treatments $Self Care/Home Management : 23-37 mins  Dick Laine, OTA Acute Rehabilitation Services  Office [phone]   Jeb LITTIE Laine 04/29/2024, 2:49 PM

## 2024-04-29 NOTE — TOC Progression Note (Signed)
 Transition of Care [facility]) - Progression Note    Patient Details  Name: Corey Tate MRN: [id number] Date of Birth: 1940-12-25  [facility] CM/SW Contact  Sherline Clack, [place] Phone Number: 04/29/2024, 10:59 AM  Clinical Narrative:     CSW spoke with daughter/Lisa about anticipated discharge to SNF. Olam informed CSW that to her understanding, the plan for the patient had shifted. Because patient was not able to receive his epidural yesterday, Dr. Joshua, his neurosurgeon reached out to family and let them know he'd try to schedule a surgery for Monday 11/10. Olam requested that CSW wait for surgery to be confirmed or not before looking into SNF placement. Olam endorses reaching out to CSW with updates and questions. CSW will continue to follow and update DC plan.   Expected Discharge Plan: Skilled Nursing Facility Barriers to Discharge: No SNF bed, Other (Surgery?)               Expected Discharge Plan and Services       Living arrangements for the past 2 months: Single Family Home                                       Social Drivers of Health (SDOH) Interventions SDOH Screenings   Food Insecurity: No Food Insecurity (04/16/2024)  Housing: Low Risk  (04/16/2024)  Transportation Needs: No Transportation Needs (04/16/2024)  Utilities: Not At Risk (04/16/2024)  Social Connections: Unknown (04/16/2024)  Tobacco Use: High Risk (04/16/2024)    Readmission Risk Interventions    04/18/2024   11:29 AM  Readmission Risk Prevention Plan  Post Dischage Appt Complete  Medication Screening Complete  Transportation Screening Complete

## 2024-04-29 NOTE — Progress Notes (Signed)
 [facility]  PROGRESS NOTE  SHERWOOD CASTILLA FMW:[id number] DOB: 1941-06-21 DOA: 04/15/2024 PCP: Silvano Angeline FALCON, NP   Brief HPI:   83 year old male with medical history of chronic atrial fibrillation not on anticoagulation, hypertension, severe COPD, PVD status post left CEA and right EIA angioplasty, active smoker, history of lung cancer on radiation, OSA on 2 L of oxygen , not on CPAP presented with severe back pain.  In the ED CTA chest abdomen/pelvis shows diffuse atherosclerotic disease but no dissection and no bony changes to thoracic or lumbar spine. Patient required 3 doses of IV opioids and was admitted to the hospital for pain control.  Neurosurgery was consulted    Assessment/Plan:   Cough -Resolved - Patient has been coughing intermittently - He is at risk of aspiration as he is eating while laying in the bed on his right side - I explained to the patient that he should try to sit up and eat; but patient is adamant to eat while laying in the bed, as sitting up worsens his back pain - Currently not requiring oxygen  - Will continue to monitor   Intractable back pain -Improving with starting on Toradol  and increasing gabapentin  dose - Presented with worsening back pain - MRI of lumbar spine showed L4-5, new left foraminal disc extrusion with severe increased left neuroforaminal stenosis. - Neurosurgery reviewed MRI and recommended pain control, steroids, if pain not controlled plan for left L4-5 transforaminal epidural steroid injection -Continues to have back pain, dose of  oxycodone  increased to 10 mg every 4 hours as needed, increased gabapentin  to 300 mg p.o. twice daily.  Continue Robaxin  750 mg p.o. 3 times daily, scheduled.  Continue Lidoderm  patch - Patient has been on aspirin  and Plavix  which need to be held for 5 days - Aspirin  Plavix  held on 10/29; has been more than 5 days since aspirin  and Plavix  were held.  IR consulted -Patient was supposed to get epidural  steroid injection today however IR PA called and said that patient does not have adequate epidural fat for the injection.  He will be referred to outpatient neuroradiology for epidural steroid injection. 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  Peripheral vascular disease/hypertension - Continue amlodipine , Zetia , pravastatin  - Aspirin  and Plavix  were held for [facility] as above - Will restart his medications as patient is no longer going to have steroid injection in the hospital  Obstructive sleep apnea - Not on CPAP  Cancer of upper lobe of right lung - Receiving radiation treatment - Alk phos is normal - CT ruled out osseous metastasis  Paroxysmal atrial fibrillation - Not on anticoagulation - Rate is controlled, continue telemetry  Severe COPD Chronic respiratory failure with hypoxemia -Continue home oxygen  2 L/min - Continue Breztri  Hypertension - Blood pressure has been soft in the hospital, amlodipine  5 mg has been discontinued - Continue to monitor patient's blood pressure in the hospital    DVT prophylaxis: Lovenox  Medications     bisacodyl  10 mg Rectal Once   budesonide-glycopyrrolate -formoterol   2 puff Inhalation BID   docusate sodium   100 mg Oral BID   enoxaparin (LOVENOX) injection  40 mg Subcutaneous Q24H   ezetimibe   10 mg Oral Daily   feeding supplement  237 mL Oral BID BM   gabapentin   300 mg Oral TID   ketorolac   15 mg  Intravenous Q8H   lidocaine   1 patch Transdermal Q24H   methocarbamol   750 mg Oral TID   pantoprazole   40 mg Oral Daily   pravastatin   40 mg Oral Daily   senna  2 tablet Oral QHS     Data Reviewed:   CBG:  No results for input(s): GLUCAP in the last 168 hours.  SpO2: 94 % O2 Flow Rate (L/min): 3 L/min FiO2 (%): 32 %    Vitals:   04/28/24 2026 04/29/24 0004 04/29/24 0516 04/29/24 0752   BP: 111/66 (!) 117/59 (!) 105/44 (!) 104/50  Pulse: (!) 55 (!) 55 (!) 52 (!) 59  Resp: 19 19 19 18   Temp: 98.4 F (36.9 C) 98.2 F (36.8 C) 98 F (36.7 C) 97.9 F (36.6 C)  TempSrc: Oral Oral Oral Oral  SpO2: 96% 95% 99% 94%  Weight:      Height:          Data Reviewed:  Basic Metabolic Panel: Recent Labs  Lab 04/23/24 0554 04/27/24 0338  NA  --  136  K  --  4.2  CL  --  98  CO2  --  29  GLUCOSE  --  118*  BUN  --  32*  CREATININE 1.02 1.06  CALCIUM  --  9.2  MG  --  2.4    CBC: Recent Labs  Lab 04/27/24 0338  WBC 10.5  HGB 12.5*  HCT 35.9*  MCV 94.0  PLT 186    LFT Recent Labs  Lab 04/27/24 0338  AST 26  ALT 37  ALKPHOS 46  BILITOT 0.6  PROT 6.3*  ALBUMIN  3.2*     Antibiotics: Anti-infectives (From admission, onward)    Start     Dose/Rate Route Frequency Ordered Stop   04/17/24 2000  azithromycin (ZITHROMAX) tablet 500 mg        500 mg Oral Every 24 hours 04/17/24 1413 04/19/24 1957   04/16/24 2000  cefTRIAXone (ROCEPHIN) 2 g in sodium chloride  0.9 % 100 mL IVPB        2 g 200 mL/hr over 30 Minutes Intravenous Every 24 hours 04/16/24 0037 04/19/24 2028   04/16/24 2000  azithromycin (ZITHROMAX) 500 mg in sodium chloride  0.9 % 250 mL IVPB  Status:  Discontinued        500 mg 250 mL/hr over 60 Minutes Intravenous Every 24 hours 04/16/24 0037 04/17/24 1413   04/15/24 2145  cefTRIAXone (ROCEPHIN) 1 g in sodium chloride  0.9 % 100 mL IVPB        1 g 200 mL/hr over 30 Minutes Intravenous  Once 04/15/24 2144 04/16/24 0028   04/15/24 2145  azithromycin (ZITHROMAX) 500 mg in sodium chloride  0.9 % 250 mL IVPB        500 mg 250 mL/hr over 60 Minutes Intravenous  Once 04/15/24 2144 04/16/24 0028        CONSULTS neurosurgery  Code Status: Full code  Family Communication: Discussed with patient's daughter Olam on phone     Subjective   Pain has significantly improved.  Patient says that the medication changes has helped him and he is able  to sit for some time as well as ambulated yesterday.   Objective    Physical Examination:  General-appears in no acute distress Heart-S1-S2, regular, no murmur auscultated Lungs-clear to auscultation bilaterally, no wheezing or crackles auscultated Abdomen-soft, nontender, no organomegaly Extremities-no edema in the lower extremities Neuro-alert, oriented x3, no focal deficit noted    Status is: Inpatient:  Corey Tate   [facility] If 7PM-7AM, please contact night-coverage at [email or web address], Office  [phone]   04/29/2024, 8:42 AM  LOS: 13 days

## 2024-04-29 NOTE — Plan of Care (Signed)

## 2024-04-29 NOTE — Plan of Care (Signed)
  Problem: Education: Goal: Knowledge of General Education information will improve Description: Including pain rating scale, medication(s)/side effects and non-pharmacologic comfort measures Outcome: Progressing   Problem: Health Behavior/Discharge Planning: Goal: Ability to manage health-related needs will improve Outcome: Progressing   Problem: Clinical Measurements: Goal: Ability to maintain clinical measurements within normal limits will improve Outcome: Progressing Goal: Will remain free from infection Outcome: Progressing Goal: Diagnostic test results will improve Outcome: Progressing Goal: Respiratory complications will improve Outcome: Progressing Goal: Cardiovascular complication will be avoided Outcome: Progressing   Problem: Coping: Goal: Level of anxiety will decrease Outcome: Progressing   Problem: Elimination: Goal: Will not experience complications related to bowel motility Outcome: Progressing Goal: Will not experience complications related to urinary retention Outcome: Progressing   Problem: Safety: Goal: Ability to remain free from injury will improve Outcome: Progressing   Problem: Skin Integrity: Goal: Risk for impaired skin integrity will decrease Outcome: Progressing   Problem: Activity: Goal: Ability to tolerate increased activity will improve Outcome: Progressing   Problem: Clinical Measurements: Goal: Ability to maintain a body temperature in the normal range will improve Outcome: Progressing   Problem: Respiratory: Goal: Ability to maintain adequate ventilation will improve Outcome: Progressing Goal: Ability to maintain a clear airway will improve Outcome: Progressing   Problem: Activity: Goal: Risk for activity intolerance will decrease Outcome: Not Progressing   Problem: Nutrition: Goal: Adequate nutrition will be maintained Outcome: Not Progressing   Problem: Pain Managment: Goal: General experience of comfort will improve  and/or be controlled Outcome: Not Progressing

## 2024-04-30 ENCOUNTER — Inpatient Hospital Stay (HOSPITAL_COMMUNITY)

## 2024-04-30 DIAGNOSIS — M5440 Lumbago with sciatica, unspecified side: Secondary | ICD-10-CM | POA: Diagnosis not present

## 2024-04-30 DIAGNOSIS — M62838 Other muscle spasm: Secondary | ICD-10-CM | POA: Diagnosis not present

## 2024-04-30 DIAGNOSIS — I25119 Atherosclerotic heart disease of native coronary artery with unspecified angina pectoris: Secondary | ICD-10-CM | POA: Diagnosis not present

## 2024-04-30 DIAGNOSIS — I959 Hypotension, unspecified: Secondary | ICD-10-CM

## 2024-04-30 DIAGNOSIS — M549 Dorsalgia, unspecified: Secondary | ICD-10-CM | POA: Diagnosis not present

## 2024-04-30 LAB — CREATININE, SERUM
Creatinine, Ser: 1.16 mg/dL (ref 0.61–1.24)
GFR, Estimated: >60 mL/min (ref >60)

## 2024-04-30 MED ORDER — ACETAMINOPHEN 325 MG PO TABS
650.0000 mg | ORAL_TABLET | ORAL | Status: DC | PRN
  Administered 2024-05-01: 650 mg via ORAL
  Filled 2024-04-30: qty 2

## 2024-04-30 MED ORDER — FUROSEMIDE 10 MG/ML IJ SOLN
20.0000 mg | Freq: Once | INTRAMUSCULAR | Status: AC
  Administered 2024-04-30: 20 mg via INTRAVENOUS
  Filled 2024-04-30: qty 2

## 2024-04-30 MED ORDER — ALUM & MAG HYDROXIDE-SIMETH 200-200-20 MG/5ML PO SUSP
15.0000 mL | Freq: Four times a day (QID) | ORAL | Status: AC
Start: 2024-04-30 — End: 2024-05-02
  Administered 2024-04-30 – 2024-05-01 (×3): 15 mL via ORAL
  Filled 2024-04-30 (×4): qty 30

## 2024-04-30 MED ORDER — MIDODRINE HCL 5 MG PO TABS
2.5000 mg | ORAL_TABLET | Freq: Two times a day (BID) | ORAL | Status: AC
  Administered 2024-04-30 – 2024-05-01 (×2): 2.5 mg via ORAL
  Filled 2024-04-30 (×2): qty 1

## 2024-04-30 MED ORDER — POTASSIUM CHLORIDE IN NACL 20-0.9 MEQ/L-% IV SOLN
INTRAVENOUS | Status: AC
  Filled 2024-04-30 (×3): qty 1000

## 2024-04-30 MED ORDER — METOCLOPRAMIDE HCL 5 MG/ML IJ SOLN
10.0000 mg | Freq: Four times a day (QID) | INTRAMUSCULAR | Status: DC
  Administered 2024-04-30 – 2024-05-03 (×9): 10 mg via INTRAVENOUS
  Filled 2024-04-30 (×9): qty 2

## 2024-04-30 MED ORDER — LOPERAMIDE HCL 2 MG PO CAPS
2.0000 mg | ORAL_CAPSULE | ORAL | Status: DC | PRN
Start: 2024-04-30 — End: 2024-05-03
  Administered 2024-05-01: 2 mg via ORAL
  Filled 2024-04-30: qty 1

## 2024-04-30 MED ORDER — HYDROCORTISONE 0.5 % EX CREA
TOPICAL_CREAM | Freq: Two times a day (BID) | CUTANEOUS | Status: AC
  Filled 2024-04-30: qty 28.35

## 2024-04-30 NOTE — Plan of Care (Signed)

## 2024-04-30 NOTE — Progress Notes (Signed)
 Assessment Lumbar radiculopathy with uncontrolled pain. Unable to get inpatient ESI  LOS: 15 days    Plan: OR tomorrow NPO@[place]  Lovenox to fall off today Hold anticoagulation and antiplatelets   Subjective: Still in severe back and LLE pain  Objective: Vital signs in last 24 hours: Temp:  [97.6 F (36.4 C)-102.6 F (39.2 C)] 98.8 F (37.1 C) (11/09 0421) Pulse Rate:  [55-91] 55 (11/09 0421) Resp:  [18-30] 20 (11/09 0421) BP: (90-133)/(47-63) 97/47 (11/09 0421) SpO2:  [91 %-98 %] 98 % (11/09 0421)  Intake/Output from previous day: 11/08 0701 - 11/09 0700 In: 119.4 [I.V.:69.4; IV Piggyback:50] Out: 1200 [Urine:600; Emesis/NG output:600] Intake/Output this shift: No intake/output data recorded.  Exam: MAEx4  Lab Results: No results for input(s): WBC, HGB, HCT, PLT in the last 72 hours. BMET Recent Labs    04/30/24 0901  CREATININE 1.16    Studies/Results: CT ABDOMEN PELVIS W CONTRAST Result Date: 05/01/2024 EXAM: CT ABDOMEN AND PELVIS WITH CONTRAST 05/01/2024 12:47:16 AM TECHNIQUE: CT of the abdomen and pelvis was performed with the administration of intravenous contrast. Multiplanar reformatted images are provided for review. Automated exposure control, iterative reconstruction, and/or weight-based adjustment of the mA/kV was utilized to reduce the radiation dose to as low as reasonably achievable. CONTRAST: 75mL of Omnipaque  350. COMPARISON: CT Chest Abdomen Pelvis 07/28/2023. CLINICAL HISTORY: Bowel obstruction suspected. FINDINGS: LOWER CHEST: Lung bases demonstrate increased right middle lobe atelectasis posteriorly. No parenchymal nodule is noted. LIVER: Fatty infiltration of the liver is noted. GALLBLADDER AND BILE DUCTS: The gallbladder is well distended without evidence of cholelithiasis. No biliary ductal dilatation. SPLEEN: No acute abnormality. PANCREAS: No acute abnormality. ADRENAL GLANDS: Mild thickening of the left adrenal gland is again noted without  a focal mass. KIDNEYS, URETERS AND BLADDER: The kidneys are well visualized with a normal enhancement pattern. A punctate left renal stone is noted. Ureters are within normal limits. The bladder is well distended. GI AND BOWEL: Stomach demonstrates no acute abnormality. Small bowel is within normal limits. No obstructive or inflammatory changes of the colon are seen. Appendix appears within normal limits. There is no bowel obstruction. PERITONEUM AND RETROPERITONEUM: No ascites. No free air. VASCULATURE: Aortic calcifications are seen within infrarenal aortic dilatation to 3.7 cm. Mural thrombus is noted. LYMPH NODES: No lymphadenopathy. REPRODUCTIVE ORGANS: The prostate is within normal limits. BONES AND SOFT TISSUES: Right hip prosthesis is seen. Postsurgical changes in the lower lumbar spine are noted. No acute osseous abnormality. No focal soft tissue abnormality. IMPRESSION: 1. No evidence of bowel obstruction. 2. 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 Recommend surveillance ultrasound in 3 years. Electronically signed by: Oneil Devonshire MD 05/01/2024 01:23 AM EST RP Workstation: [id number]   DG Chest 1 View Result Date: 04/30/2024 EXAM: 1 VIEW(S) XRAY OF THE CHEST 04/30/2024 08:47:00 PM COMPARISON: None available. CLINICAL HISTORY: Hypoxemia FINDINGS: LUNGS AND PLEURA: Asymmetric elevation of right hemidiaphragm. Right basilar patchy opacities, figure atelectasis. No pulmonary edema. No pleural effusion. No pneumothorax. HEART AND MEDIASTINUM: Atherosclerotic calcifications. No acute abnormality of the cardiac and mediastinal silhouettes. BONES AND SOFT TISSUES: Intact cervical spinal fixation hardware. No acute osseous abnormality. IMPRESSION: 1. Right basilar patchy opacities, atelectasis versus pneumonia. 2. Asymmetric elevation of the right hemidiaphragm. Electronically signed by: Norman Gatlin MD 04/30/2024 08:57 PM EST RP Workstation:  [id number]   DG Abd 1 View Result Date: 04/30/2024 EXAM: 1 VIEW XRAY OF THE ABDOMEN 04/30/2024 08:47:00 PM COMPARISON: None available. CLINICAL HISTORY: Vomiting [id number]; [phone] Abdominal  distension [id number] FINDINGS: BOWEL: Nonobstructive bowel gas pattern. Gaseous distension of the stomach. Gaseous distension of colon in the right hemiabdomen. Moderate burden of stool in the distal colon. SOFT TISSUES: No opaque urinary calculi. BONES: Lower lumbar fusion. IMPRESSION: 1. Gaseous distension of the stomach and bowel loops in the right hemiabdomen with moderate stool in the descending colon. Findings favor ileus / constipation. If there is ongoing concern for obstruction, CT is recommended. Electronically signed by: Norman Gatlin MD 04/30/2024 08:55 PM EST RP Workstation: [id number]      Dorn JONELLE Glade 05/01/2024, 7:49 AM

## 2024-04-30 NOTE — Progress Notes (Signed)
 [facility]  PROGRESS NOTE  Corey Tate FMW:[id number] DOB: 01-10-1941 DOA: 04/15/2024 PCP: Silvano Angeline FALCON, NP   Brief HPI:   83 year old male with medical history of chronic atrial fibrillation not on anticoagulation, hypertension, severe COPD, PVD status post left CEA and right EIA angioplasty, active smoker, history of lung cancer on radiation, OSA on 2 L of oxygen , not on CPAP presented with severe back pain.  In the ED CTA chest abdomen/pelvis shows diffuse atherosclerotic disease but no dissection and no bony changes to thoracic or lumbar spine. Patient required 3 doses of IV opioids and was admitted to the hospital for pain control.  Neurosurgery was consulted    Assessment/Plan:   Cough -Resolved - Patient has been coughing intermittently - He is at risk of aspiration as he is eating while laying in the bed on his right side - I explained to the patient that he should try to sit up and eat; but patient is adamant to eat while laying in the bed, as sitting up worsens his back pain - Currently not requiring oxygen  - Will continue to monitor  Soft blood pressure - Patient says that he has not been getting opioids due to soft blood pressure - Amlodipine  was stopped 2 days ago - No clear etiology; no signs of infection; no dehydration -Has poor p.o. intake - Will give midodrine 2.5 mg p.o. twice daily for 4 doses - Will continue to monitor   Intractable back pain -Improving with starting on Toradol  and increasing gabapentin  dose - Presented with worsening back pain - MRI of lumbar spine showed L4-5, new left foraminal disc extrusion with severe increased left neuroforaminal stenosis. - Neurosurgery reviewed MRI and recommended pain control, steroids, if pain not controlled plan for left L4-5 transforaminal epidural steroid injection -Continues to have back pain, dose of  oxycodone  increased to 10 mg every 4 hours as needed, increased gabapentin  to 300 mg p.o. twice  daily.  Continue Robaxin  750 mg p.o. 3 times daily, scheduled.  Continue Lidoderm  patch - Patient has been on aspirin  and Plavix  which need to be held for 5 days - Aspirin  Plavix  held on 10/29; has been more than 5 days since aspirin  and Plavix  were held.  IR consulted -Patient was supposed to get epidural steroid injection today however IR PA called and said that patient does not have adequate epidural fat for the injection.  He will be referred to outpatient neuroradiology for epidural steroid injection. 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 - Alk phos is normal - CT ruled out osseous metastasis  Paroxysmal atrial fibrillation - Not on anticoagulation - Rate is controlled, continue telemetry  Severe COPD Chronic respiratory failure with hypoxemia -Continue home oxygen  2 L/min - Continue Breztri  Hypertension - Blood pressure has been soft in the hospital, amlodipine  5 mg has been discontinued - Continue to monitor patient's blood pressure in the hospital    DVT prophylaxis: Lovenox  Medications     aspirin  EC  81 mg Oral Daily  bisacodyl  10 mg Rectal Once   budesonide-glycopyrrolate -formoterol   2 puff Inhalation BID   clopidogrel   75 mg Oral Daily   docusate sodium   100 mg Oral BID   enoxaparin (LOVENOX) injection  40 mg Subcutaneous Q24H   ezetimibe   10 mg Oral Daily   feeding supplement  237 mL Oral BID BM   gabapentin   300 mg Oral TID   ketorolac   15 mg Intravenous Q8H   lidocaine   1  patch Transdermal Q24H   methocarbamol   750 mg Oral TID   midodrine  2.5 mg Oral BID WC   pantoprazole   40 mg Oral Daily   pravastatin   40 mg Oral Daily   senna  2 tablet Oral QHS     Data Reviewed:   CBG:  No results for input(s): GLUCAP in the last 168 hours.  SpO2: 91 % O2 Flow Rate (L/min): 3 L/min FiO2 (%): 32 %    Vitals:   04/30/24 0002 04/30/24 0428 04/30/24 0744 04/30/24 0755  BP: (!) 98/52 (!) 107/46 (!) 111/50   Pulse: (!) 58 94 (!) 49 91  Resp:  18 19 18   Temp: 98.4 F (36.9 C) 98.1 F (36.7 C) 98.7 F (37.1 C)   TempSrc: Oral Oral    SpO2: 97% 99% 100% 91%  Weight:      Height:          Data Reviewed:  Basic Metabolic Panel: Recent Labs  Lab 04/27/24 0338  NA 136  K 4.2  CL 98  CO2 29  GLUCOSE 118*  BUN 32*  CREATININE 1.06  CALCIUM 9.2  MG 2.4    CBC: Recent Labs  Lab 04/27/24 0338  WBC 10.5  HGB 12.5*  HCT 35.9*  MCV 94.0  PLT 186    LFT Recent Labs  Lab 04/27/24 0338  AST 26  ALT 37  ALKPHOS 46  BILITOT 0.6  PROT 6.3*  ALBUMIN  3.2*     Antibiotics: Anti-infectives (From admission, onward)    Start     Dose/Rate Route Frequency Ordered Stop   04/17/24 2000  azithromycin (ZITHROMAX) tablet 500 mg        500 mg Oral Every 24 hours 04/17/24 1413 04/19/24 1957   04/16/24 2000  cefTRIAXone (ROCEPHIN) 2 g in sodium chloride  0.9 % 100 mL IVPB        2 g 200 mL/hr over 30 Minutes Intravenous Every 24 hours 04/16/24 0037 04/19/24 2028   04/16/24 2000  azithromycin (ZITHROMAX) 500 mg in sodium chloride  0.9 % 250 mL IVPB  Status:  Discontinued        500 mg 250 mL/hr over 60 Minutes Intravenous Every 24 hours 04/16/24 0037 04/17/24 1413   04/15/24 2145  cefTRIAXone (ROCEPHIN) 1 g in sodium chloride  0.9 % 100 mL IVPB        1 g 200 mL/hr over 30 Minutes Intravenous  Once 04/15/24 2144 04/16/24 0028   04/15/24 2145  azithromycin (ZITHROMAX) 500 mg in sodium chloride  0.9 % 250 mL IVPB        500 mg 250 mL/hr over 60  Minutes Intravenous  Once 04/15/24 2144 04/16/24 0028        CONSULTS neurosurgery  Code Status: Full code  Family Communication: Discussed with patient's daughter Corey Tate on phone     Subjective   Patient was able to sit and eat his breakfast for 3 minutes today.  Pain is improving with IV Toradol .  Patient blood pressure has been soft and he  has not been getting opioids due to concern for low blood pressure.  Objective    Physical Examination:  General-appears in no acute distress Heart-S1-S2, regular, no murmur auscultated Lungs-clear to auscultation bilaterally, no wheezing or crackles auscultated Abdomen-soft, nontender, no organomegaly Extremities-no edema in the lower extremities Back-tenderness in left buttock Neuro-alert, oriented x3, no focal deficit noted    Status is: Inpatient:             Corey Tate   [facility] If 7PM-7AM, please contact night-coverage at [email or web address], Office  [phone]   04/30/2024, 8:23 AM  LOS: 14 days

## 2024-04-30 NOTE — Progress Notes (Signed)
 Plan for surgery Monday morning. I stopped the aspirin  and plavix . Ok with DVT ppx

## 2024-05-01 ENCOUNTER — Inpatient Hospital Stay (HOSPITAL_COMMUNITY)

## 2024-05-01 DIAGNOSIS — M5440 Lumbago with sciatica, unspecified side: Secondary | ICD-10-CM | POA: Diagnosis not present

## 2024-05-01 DIAGNOSIS — M62838 Other muscle spasm: Secondary | ICD-10-CM | POA: Diagnosis not present

## 2024-05-01 DIAGNOSIS — M549 Dorsalgia, unspecified: Secondary | ICD-10-CM | POA: Diagnosis not present

## 2024-05-01 DIAGNOSIS — I25119 Atherosclerotic heart disease of native coronary artery with unspecified angina pectoris: Secondary | ICD-10-CM | POA: Diagnosis not present

## 2024-05-01 MED ORDER — IOHEXOL 350 MG/ML SOLN
75.0000 mL | Freq: Once | INTRAVENOUS | Status: AC | PRN
  Administered 2024-05-01: 75 mL via INTRAVENOUS

## 2024-05-01 NOTE — Progress Notes (Signed)
 [facility]  PROGRESS NOTE  Corey Tate FMW:[id number] DOB: 12-24-1940 DOA: 04/15/2024 PCP: Silvano Angeline FALCON, NP   Brief HPI:   83 year old male with medical history of chronic atrial fibrillation not on anticoagulation, hypertension, severe COPD, PVD status post left CEA and right EIA angioplasty, active smoker, history of lung cancer on radiation, OSA on 2 L of oxygen , not on CPAP presented with severe back pain.  In the ED CTA chest abdomen/pelvis shows diffuse atherosclerotic disease but no dissection and no bony changes to thoracic or lumbar spine. Patient required 3 doses of IV opioids and was admitted to the hospital for pain control.  Neurosurgery was consulted    Assessment/Plan:   Cough -Resolved - Patient has been coughing intermittently - He is at risk of aspiration as he is eating while laying in the bed on his right side - I explained to the patient that he should try to sit up and eat; but patient is adamant to eat while laying in the bed, as sitting up worsens his back pain - Currently not requiring oxygen  - Will continue to monitor  Vomiting/diarrhea -Resolved - Unclear etiology - X-ray abdomen shows gaseous distention - CT abdomen/pelvis did not show any significant abnormality  Hypotension - Patient says that he has not been getting opioids due to soft blood pressure - Amlodipine  was stopped 2 days ago - No clear etiology; no signs of infection; no dehydration -Has adequate p.o. intake - Continue midodrine 2.5 mg p.o. twice daily for 4 doses - Will continue to monitor   Intractable back pain -Improving with starting on Toradol  and increasing gabapentin  dose - Presented with worsening back pain - MRI of lumbar spine showed L4-5, new left foraminal disc extrusion with severe increased left neuroforaminal stenosis. - Neurosurgery reviewed MRI and recommended pain control, steroids, if pain not controlled plan for left L4-5 transforaminal epidural steroid  injection -Continues to have back pain, dose of  oxycodone  increased to 10 mg every 4 hours as needed, increased gabapentin  to 300 mg p.o. twice daily.  Continue Robaxin  750 mg p.o. 3 times daily, scheduled.  Continue Lidoderm  patch - Patient has been on aspirin  and Plavix  which need to be held for 5 days - Aspirin  Plavix  held on 10/29; has been more than 5 days since aspirin  and Plavix  were held.  IR consulted -Patient was supposed to get epidural steroid injection today however IR PA called and said that patient does not have adequate epidural fat for the injection.  He will be referred to outpatient neuroradiology for epidural steroid injection. 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  Obstructive sleep apnea - Not on CPAP  Cancer of upper lobe of right lung - Receiving radiation treatment - Alk phos is normal - CT ruled out osseous metastasis  Paroxysmal atrial fibrillation - Not on anticoagulation - Rate is controlled, continue telemetry  Severe COPD Chronic respiratory failure with hypoxemia -Continue home oxygen  2 L/min - Continue Breztri  Hypertension - Blood pressure has been soft in  the hospital, amlodipine  5 mg has been discontinued - Continue to monitor patient's blood pressure in the hospital    DVT prophylaxis: Lovenox  Medications     alum & mag hydroxide-simeth  15 mL Oral Q6H   bisacodyl  10 mg Rectal Once   budesonide-glycopyrrolate -formoterol   2 puff Inhalation BID   docusate  sodium  100 mg Oral BID   enoxaparin (LOVENOX) injection  40 mg Subcutaneous Q24H   ezetimibe   10 mg Oral Daily   feeding supplement  237 mL Oral BID BM   gabapentin   300 mg Oral TID   hydrocortisone cream   Topical BID   ketorolac   15 mg Intravenous Q8H   lidocaine   1 patch Transdermal Q24H   methocarbamol   750 mg Oral TID   metoCLOPramide  (REGLAN ) injection  10 mg Intravenous Q6H   midodrine  2.5 mg Oral BID WC   pantoprazole   40 mg Oral Daily   pravastatin   40 mg Oral Daily   senna  2 tablet Oral QHS     Data Reviewed:   CBG:  No results for input(s): GLUCAP in the last 168 hours.  SpO2: 99 % O2 Flow Rate (L/min): 3 L/min FiO2 (%): 32 %    Vitals:   05/01/24 0259 05/01/24 0300 05/01/24 0421 05/01/24 0752  BP: 133/63 133/63 (!) 97/47 (!) 143/56  Pulse: 69 68 (!) 55 62  Resp: (!) 28  20   Temp: 99.5 F (37.5 C)  98.8 F (37.1 C) 98 F (36.7 C)  TempSrc:    Oral  SpO2:  97% 98% 99%  Weight:      Height:          Data Reviewed:  Basic Metabolic Panel: Recent Labs  Lab 04/27/24 0338 04/30/24 0901  NA 136  --   K 4.2  --   CL 98  --   CO2 29  --   GLUCOSE 118*  --   BUN 32*  --   CREATININE 1.06 1.16  CALCIUM 9.2  --   MG 2.4  --     CBC: Recent Labs  Lab 04/27/24 0338  WBC 10.5  HGB 12.5*  HCT 35.9*  MCV 94.0  PLT 186    LFT Recent Labs  Lab 04/27/24 0338  AST 26  ALT 37  ALKPHOS 46  BILITOT 0.6  PROT 6.3*  ALBUMIN  3.2*     Antibiotics: Anti-infectives (From admission, onward)    Start     Dose/Rate Route Frequency Ordered Stop   04/17/24 2000  azithromycin (ZITHROMAX) tablet 500 mg        500 mg Oral Every 24 hours 04/17/24 1413 04/19/24 1957   04/16/24 2000  cefTRIAXone (ROCEPHIN) 2 g in sodium chloride  0.9 % 100 mL IVPB        2 g 200 mL/hr over 30 Minutes Intravenous Every 24 hours 04/16/24 0037 04/19/24 2028   04/16/24 2000  azithromycin (ZITHROMAX) 500 mg in sodium chloride  0.9 % 250 mL IVPB  Status:  Discontinued         500 mg 250 mL/hr over 60 Minutes Intravenous Every 24 hours 04/16/24 0037 04/17/24 1413   04/15/24 2145  cefTRIAXone (ROCEPHIN) 1 g in sodium chloride  0.9 % 100 mL IVPB        1 g 200 mL/hr over 30 Minutes Intravenous  Once 04/15/24 2144 04/16/24 0028   04/15/24 2145  azithromycin (ZITHROMAX) 500 mg in sodium chloride  0.9 % 250 mL IVPB  500 mg 250 mL/hr over 60 Minutes Intravenous  Once 04/15/24 2144 04/16/24 0028        CONSULTS neurosurgery  Code Status: Full code  Family Communication: Discussed with patient's daughter at bedside     Subjective   Patient seen and examined, neurosurgery has seen the patient and plan for surgery on Monday.  Aspirin , Plavix  are currently on hold Developed diarrhea last night, CT abdomen/pelvis was unremarkable.  Diarrhea, nausea and vomiting have resolved at this time.  Objective    Physical Examination:  General-appears in no acute distress Heart-S1-S2, regular, no murmur auscultated Lungs-clear to auscultation bilaterally, no wheezing or crackles auscultated Abdomen-soft, nontender, no organomegaly Extremities-no edema in the lower extremities Neuro-alert, oriented x3, no focal deficit noted    Status is: Inpatient:             Sabas GORMAN Brod   [facility] If 7PM-7AM, please contact night-coverage at [email or web address], Office  [phone]   05/01/2024, 8:46 AM  LOS: 15 days

## 2024-05-01 NOTE — Evaluation (Signed)
 RT Evaluate and Treat Note  05/01/2024   Breathing is (select one): Same as normal    The following was found on auscultation (select multiple):  Bilateral Breath Sounds: Diminished (05/01/24 1209)  R Upper  Breath Sounds: Diminished (05/01/24 1209) L Upper Breath Sounds: Diminished (05/01/24 1209) R Lower Breath Sounds: Diminished (05/01/24 1209) L Lower Breath Sounds: Diminished (05/01/24 1209)    Cough Assessment: Cough: Non-productive (05/01/24 1209)    Most Recent Chest Xray: LUNGS AND PLEURA: Asymmetric elevation of right hemidiaphragm. Right basilar patchy opacities, figure atelectasis. No pulmonary edema. No pleural effusion. No pneumothorax   The following medications and/or interventions were ordered/changed/discontinued as part of the Respiratory Treatment protocol:   Medication Changes: None   Airway Clearance Changes: None   Oxygen  Therapy Changes:None

## 2024-05-01 NOTE — Plan of Care (Signed)

## 2024-05-02 ENCOUNTER — Encounter (HOSPITAL_COMMUNITY): Payer: Self-pay | Admitting: Family Medicine

## 2024-05-02 ENCOUNTER — Other Ambulatory Visit: Payer: Self-pay

## 2024-05-02 ENCOUNTER — Inpatient Hospital Stay (HOSPITAL_COMMUNITY): Admitting: Anesthesiology

## 2024-05-02 ENCOUNTER — Inpatient Hospital Stay (HOSPITAL_COMMUNITY)

## 2024-05-02 ENCOUNTER — Encounter (HOSPITAL_COMMUNITY)
Admission: EM | Disposition: A | Discharge status: Home Health | Payer: Self-pay | Source: Home / Self Care | Attending: Internal Medicine

## 2024-05-02 DIAGNOSIS — M5440 Lumbago with sciatica, unspecified side: Secondary | ICD-10-CM | POA: Diagnosis not present

## 2024-05-02 DIAGNOSIS — I2510 Atherosclerotic heart disease of native coronary artery without angina pectoris: Secondary | ICD-10-CM | POA: Diagnosis not present

## 2024-05-02 DIAGNOSIS — F17210 Nicotine dependence, cigarettes, uncomplicated: Secondary | ICD-10-CM

## 2024-05-02 DIAGNOSIS — I25119 Atherosclerotic heart disease of native coronary artery with unspecified angina pectoris: Secondary | ICD-10-CM | POA: Diagnosis not present

## 2024-05-02 DIAGNOSIS — M5116 Intervertebral disc disorders with radiculopathy, lumbar region: Secondary | ICD-10-CM | POA: Diagnosis not present

## 2024-05-02 DIAGNOSIS — C3411 Malignant neoplasm of upper lobe, right bronchus or lung: Secondary | ICD-10-CM | POA: Diagnosis not present

## 2024-05-02 DIAGNOSIS — M549 Dorsalgia, unspecified: Secondary | ICD-10-CM | POA: Diagnosis not present

## 2024-05-02 HISTORY — PX: LAMINECTOMY WITH POSTERIOR LATERAL ARTHRODESIS LEVEL 2: SHX6336

## 2024-05-02 LAB — SURGICAL PCR SCREEN
MRSA, PCR: POSITIVE — AB
Staphylococcus aureus: POSITIVE — AB

## 2024-05-02 LAB — TYPE AND SCREEN
ABO/RH(D): O POS
Antibody Screen: NEGATIVE

## 2024-05-02 SURGERY — LAMINECTOMY WITH POSTERIOR LATERAL ARTHRODESIS LEVEL 2
Anesthesia: General | Site: Back

## 2024-05-02 MED ORDER — BUPIVACAINE HCL (PF) 0.25 % IJ SOLN
INTRAMUSCULAR | Status: AC
  Filled 2024-05-02: qty 30

## 2024-05-02 MED ORDER — CHLORHEXIDINE GLUCONATE 0.12 % MT SOLN
OROMUCOSAL | Status: AC
  Administered 2024-05-02: 15 mL via OROMUCOSAL
  Filled 2024-05-02: qty 15

## 2024-05-02 MED ORDER — LIDOCAINE 2% (20 MG/ML) 5 ML SYRINGE
INTRAMUSCULAR | Status: DC | PRN
  Administered 2024-05-02: 60 mg via INTRAVENOUS

## 2024-05-02 MED ORDER — CHLORHEXIDINE GLUCONATE 4 % EX SOLN: 1.0000 | CUTANEOUS | 1 refills | Status: DC

## 2024-05-02 MED ORDER — FENTANYL CITRATE (PF) 250 MCG/5ML IJ SOLN
INTRAMUSCULAR | Status: DC | PRN
  Administered 2024-05-02: 50 ug via INTRAVENOUS
  Administered 2024-05-02: 100 ug via INTRAVENOUS

## 2024-05-02 MED ORDER — ONDANSETRON HCL 4 MG/2ML IJ SOLN
INTRAMUSCULAR | Status: AC
  Filled 2024-05-02: qty 4

## 2024-05-02 MED ORDER — ROCURONIUM BROMIDE 10 MG/ML (PF) SYRINGE
PREFILLED_SYRINGE | INTRAVENOUS | Status: AC
  Filled 2024-05-02: qty 20

## 2024-05-02 MED ORDER — PHENYLEPHRINE 80 MCG/ML (10ML) SYRINGE FOR IV PUSH (FOR BLOOD PRESSURE SUPPORT)
PREFILLED_SYRINGE | INTRAVENOUS | Status: DC | PRN
  Administered 2024-05-02: 160 ug via INTRAVENOUS

## 2024-05-02 MED ORDER — THROMBIN 20000 UNITS EX SOLR
CUTANEOUS | Status: AC
Start: 2024-05-02 — End: 2024-05-02
  Filled 2024-05-02: qty 20000

## 2024-05-02 MED ORDER — ALBUTEROL SULFATE (2.5 MG/3ML) 0.083% IN NEBU
INHALATION_SOLUTION | RESPIRATORY_TRACT | Status: DC
Start: 2024-05-02 — End: 2024-05-02
  Filled 2024-05-02: qty 3

## 2024-05-02 MED ORDER — BUPIVACAINE HCL (PF) 0.25 % IJ SOLN
INTRAMUSCULAR | Status: DC | PRN
  Administered 2024-05-02: 10 mL

## 2024-05-02 MED ORDER — 0.9 % SODIUM CHLORIDE (POUR BTL) OPTIME
TOPICAL | Status: DC | PRN
  Administered 2024-05-02: 1000 mL

## 2024-05-02 MED ORDER — THROMBIN 5000 UNITS EX KIT
PACK | CUTANEOUS | Status: AC
  Filled 2024-05-02: qty 1

## 2024-05-02 MED ORDER — ONDANSETRON HCL 4 MG/2ML IJ SOLN
INTRAMUSCULAR | Status: DC | PRN
  Administered 2024-05-02: 4 mg via INTRAVENOUS

## 2024-05-02 MED ORDER — FENTANYL CITRATE (PF) 250 MCG/5ML IJ SOLN
INTRAMUSCULAR | Status: AC
  Filled 2024-05-02: qty 5

## 2024-05-02 MED ORDER — THROMBIN 20000 UNITS EX SOLR
CUTANEOUS | Status: DC | PRN
  Administered 2024-05-02: 20 mL via TOPICAL

## 2024-05-02 MED ORDER — CHLORHEXIDINE GLUCONATE 0.12 % MT SOLN: 15.0000 mL | Freq: Once | OROMUCOSAL | Status: AC

## 2024-05-02 MED ORDER — ORAL CARE MOUTH RINSE: 15.0000 mL | Freq: Once | OROMUCOSAL | Status: AC

## 2024-05-02 MED ORDER — LIDOCAINE 2% (20 MG/ML) 5 ML SYRINGE
INTRAMUSCULAR | Status: AC
  Filled 2024-05-02: qty 10

## 2024-05-02 MED ORDER — SUGAMMADEX SODIUM 200 MG/2ML IV SOLN
INTRAVENOUS | Status: DC | PRN
  Administered 2024-05-02: 160 mg via INTRAVENOUS

## 2024-05-02 MED ORDER — PHENYLEPHRINE 80 MCG/ML (10ML) SYRINGE FOR IV PUSH (FOR BLOOD PRESSURE SUPPORT)
PREFILLED_SYRINGE | INTRAVENOUS | Status: AC
  Filled 2024-05-02: qty 20

## 2024-05-02 MED ORDER — CEFAZOLIN SODIUM-DEXTROSE 2-3 GM-%(50ML) IV SOLR
INTRAVENOUS | Status: DC | PRN
  Administered 2024-05-02: 2 g via INTRAVENOUS

## 2024-05-02 MED ORDER — MUPIROCIN 2 % EX OINT: 1.0000 | TOPICAL_OINTMENT | Freq: Two times a day (BID) | CUTANEOUS | 0 refills | Status: DC

## 2024-05-02 MED ORDER — LACTATED RINGERS IV SOLN: INTRAVENOUS | Status: DC | PRN

## 2024-05-02 MED ORDER — ORAL CARE MOUTH RINSE
15.0000 mL | Freq: Once | OROMUCOSAL | Status: DC
  Administered 2024-05-02: 15 mL via OROMUCOSAL

## 2024-05-02 MED ORDER — LACTATED RINGERS IV SOLN: INTRAVENOUS | Status: DC

## 2024-05-02 MED ORDER — IPRATROPIUM-ALBUTEROL 0.5-2.5 (3) MG/3ML IN SOLN
3.0000 mL | Freq: Once | RESPIRATORY_TRACT | Status: AC
  Administered 2024-05-02: 3 mL via RESPIRATORY_TRACT

## 2024-05-02 MED ORDER — THROMBIN 5000 UNITS EX SOLR
OROMUCOSAL | Status: DC | PRN
  Administered 2024-05-02: 5 mL via TOPICAL

## 2024-05-02 MED ORDER — ROCURONIUM BROMIDE 10 MG/ML (PF) SYRINGE
PREFILLED_SYRINGE | INTRAVENOUS | Status: DC | PRN
  Administered 2024-05-02: 50 mg via INTRAVENOUS
  Administered 2024-05-02: 20 mg via INTRAVENOUS

## 2024-05-02 MED ORDER — CHLORHEXIDINE GLUCONATE 0.12 % MT SOLN: 15.0000 mL | Freq: Once | OROMUCOSAL | Status: DC

## 2024-05-02 MED ORDER — FENTANYL CITRATE (PF) 100 MCG/2ML IJ SOLN: 25.0000 ug | INTRAMUSCULAR | Status: DC | PRN

## 2024-05-02 MED ORDER — PROPOFOL 10 MG/ML IV BOLUS
INTRAVENOUS | Status: DC | PRN
  Administered 2024-05-02: 70 mg via INTRAVENOUS

## 2024-05-02 MED ORDER — PHENYLEPHRINE HCL-NACL 20-0.9 MG/250ML-% IV SOLN
INTRAVENOUS | Status: DC | PRN
  Administered 2024-05-02: 20 ug/min via INTRAVENOUS

## 2024-05-02 MED ORDER — DEXAMETHASONE SOD PHOSPHATE PF 10 MG/ML IJ SOLN
INTRAMUSCULAR | Status: DC | PRN
  Administered 2024-05-02: 5 mg via INTRAVENOUS

## 2024-05-02 SURGICAL SUPPLY — 52 items
ALLOGRAFT BONE FIBER KORE 2.5 (Bone Implant) IMPLANT
ALLOGRAFT BONE FIBER KORE 5 (Bone Implant) IMPLANT
BAG COUNTER SPONGE SURGICOUNT (BAG) ×1 IMPLANT
BASKET BONE COLLECTION (BASKET) IMPLANT
BENZOIN TINCTURE PRP APPL 2/3 (GAUZE/BANDAGES/DRESSINGS) ×1 IMPLANT
BLADE BONE MILL MEDIUM (MISCELLANEOUS) IMPLANT
BLADE CLIPPER SURG (BLADE) IMPLANT
BUR CARBIDE MATCH 3.0 (BURR) ×1 IMPLANT
CANISTER SUCTION 3000ML PPV (SUCTIONS) ×1 IMPLANT
CLSR STERI-STRIP ANTIMIC 1/2X4 (GAUZE/BANDAGES/DRESSINGS) IMPLANT
CNTNR URN SCR LID CUP LEK RST (MISCELLANEOUS) ×1 IMPLANT
COVER BACK TABLE 60X90IN (DRAPES) ×1 IMPLANT
DRAPE C-ARM 42X72 X-RAY (DRAPES) IMPLANT
DRAPE LAPAROTOMY 100X72X124 (DRAPES) ×1 IMPLANT
DRAPE SURG 17X23 STRL (DRAPES) ×1 IMPLANT
DRSG AQUACEL AG ADV 3.5X 6 (GAUZE/BANDAGES/DRESSINGS) ×1 IMPLANT
DRSG OPSITE POSTOP 4X6 (GAUZE/BANDAGES/DRESSINGS) IMPLANT
DURAPREP 26ML APPLICATOR (WOUND CARE) ×1 IMPLANT
ELECTRODE REM PT RTRN 9FT ADLT (ELECTROSURGICAL) ×1 IMPLANT
EVACUATOR 1/8 PVC DRAIN (DRAIN) IMPLANT
GAUZE 4X4 16PLY ~~LOC~~+RFID DBL (SPONGE) IMPLANT
GLOVE BIO SURGEON STRL SZ7 (GLOVE) IMPLANT
GLOVE BIO SURGEON STRL SZ8 (GLOVE) ×2 IMPLANT
GLOVE BIOGEL PI IND STRL 7.0 (GLOVE) IMPLANT
GOWN STRL REUS W/ TWL LRG LVL3 (GOWN DISPOSABLE) IMPLANT
GOWN STRL REUS W/ TWL XL LVL3 (GOWN DISPOSABLE) ×2 IMPLANT
GOWN STRL REUS W/TWL 2XL LVL3 (GOWN DISPOSABLE) IMPLANT
GRAFT BN 10X1XDBM MAGNIFUSE (Bone Implant) IMPLANT
HEMOSTAT POWDER KIT SURGIFOAM (HEMOSTASIS) ×1 IMPLANT
KIT BASIN OR (CUSTOM PROCEDURE TRAY) ×1 IMPLANT
KIT INFUSE SMALL (Orthopedic Implant) IMPLANT
KIT TURNOVER KIT B (KITS) ×1 IMPLANT
MILL BONE PREP (MISCELLANEOUS) IMPLANT
NDL HYPO 25X1 1.5 SAFETY (NEEDLE) ×1 IMPLANT
NEEDLE HYPO 25X1 1.5 SAFETY (NEEDLE) ×1 IMPLANT
PACK LAMINECTOMY NEURO (CUSTOM PROCEDURE TRAY) ×1 IMPLANT
PAD ARMBOARD POSITIONER FOAM (MISCELLANEOUS) ×3 IMPLANT
ROD LORD LIPPED TI 5.5X45 (Rod) IMPLANT
SCREW CORT PA 7.5X45 (Screw) IMPLANT
SET SCREW SPNE (Screw) IMPLANT
SOLN 0.9% NACL POUR BTL 1000ML (IV SOLUTION) ×1 IMPLANT
SOLN STERILE WATER BTL 1000 ML (IV SOLUTION) ×1 IMPLANT
SOLUTION IRRIG SURGIPHOR (IV SOLUTION) ×1 IMPLANT
SPONGE SURGIFOAM ABS GEL 100 (HEMOSTASIS) ×1 IMPLANT
SPONGE T-LAP 4X18 ~~LOC~~+RFID (SPONGE) IMPLANT
STRIP CLOSURE SKIN 1/2X4 (GAUZE/BANDAGES/DRESSINGS) ×2 IMPLANT
SUT VIC AB 0 CT1 18XCR BRD8 (SUTURE) ×1 IMPLANT
SUT VIC AB 2-0 CP2 18 (SUTURE) ×1 IMPLANT
SUT VIC AB 3-0 SH 8-18 (SUTURE) ×2 IMPLANT
TOWEL GREEN STERILE (TOWEL DISPOSABLE) ×1 IMPLANT
TOWEL GREEN STERILE FF (TOWEL DISPOSABLE) ×1 IMPLANT
TRAY FOLEY MTR SLVR 16FR STAT (SET/KITS/TRAYS/PACK) IMPLANT

## 2024-05-02 NOTE — Progress Notes (Signed)
 [facility]  PROGRESS NOTE  Corey Tate FMW:[id number] DOB: 08/21/1940 DOA: 04/15/2024 PCP: Silvano Angeline FALCON, NP   Brief HPI:   83 year old male with medical history of chronic atrial fibrillation not on anticoagulation, hypertension, severe COPD, PVD status post left CEA and right EIA angioplasty, active smoker, history of lung cancer on radiation, OSA on 2 L of oxygen , not on CPAP presented with severe back pain.  In the ED CTA chest abdomen/pelvis shows diffuse atherosclerotic disease but no dissection and no bony changes to thoracic or lumbar spine. Patient required 3 doses of IV opioids and was admitted to the hospital for pain control.  Neurosurgery was consulted    Assessment/Plan:   Cough -Resolved - Patient has been coughing intermittently - He is at risk of aspiration as he is eating while laying in the bed on his right side - I explained to the patient that he should try to sit up and eat; but patient is adamant to eat while laying in the bed, as sitting up worsens his back pain - Currently not requiring oxygen  - Will continue to monitor  Vomiting/diarrhea -Resolved - Unclear etiology - X-ray abdomen shows gaseous distention - CT abdomen/pelvis did not show any significant abnormality  Hypotension - Patient says that he has not been getting opioids due to soft blood pressure - Amlodipine  was stopped 2 days ago - No clear etiology; no signs of infection; no dehydration -Has adequate p.o. intake - Continue midodrine 2.5 mg p.o. twice daily for 4 doses - Will continue to monitor   Intractable back pain -Improving with starting on Toradol  and increasing gabapentin  dose - Presented with worsening back pain - MRI of lumbar spine showed L4-5, new left foraminal disc extrusion with severe increased left neuroforaminal stenosis. - Neurosurgery reviewed MRI and recommended pain control, steroids, if pain not controlled plan for left L4-5 transforaminal epidural steroid  injection -Continues to have back pain, dose of  oxycodone  increased to 10 mg every 4 hours as needed, increased gabapentin  to 300 mg p.o. twice daily.  Continue Robaxin  750 mg p.o. 3 times daily, scheduled.  Continue Lidoderm  patch - Patient has been on aspirin  and Plavix  which need to be held for 5 days - Aspirin  Plavix  held on 10/29; has been more than 5 days since aspirin  and Plavix  were held.  IR consulted -Patient was supposed to get epidural steroid injection today however IR PA called and said that patient does not have adequate epidural fat for the injection.  He will be referred to outpatient neuroradiology for epidural steroid injection. 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  Obstructive sleep apnea - Not on CPAP  Cancer of upper lobe of right lung - Receiving radiation treatment - Alk phos is normal - CT ruled out osseous metastasis  Paroxysmal atrial fibrillation - Not on anticoagulation - Rate is controlled, continue telemetry  Severe COPD Chronic respiratory failure with hypoxemia -Continue home oxygen  2 L/min - Continue Breztri  Hypertension - Blood pressure has been  soft in the hospital, amlodipine  5 mg has been discontinued - Continue to monitor patient's blood pressure in the hospital    DVT prophylaxis: Lovenox  Medications     bisacodyl  10 mg Rectal Once   budesonide-glycopyrrolate -formoterol   2 puff Inhalation BID   docusate sodium   100 mg Oral BID   ezetimibe    10 mg Oral Daily   feeding supplement  237 mL Oral BID BM   gabapentin   300 mg Oral TID   hydrocortisone cream   Topical BID   ketorolac   15 mg Intravenous Q8H   lidocaine   1 patch Transdermal Q24H   methocarbamol   750 mg Oral TID   metoCLOPramide  (REGLAN ) injection  10 mg Intravenous Q6H   midodrine  2.5 mg Oral BID WC   pantoprazole   40 mg Oral Daily   pravastatin   40 mg Oral Daily   senna  2 tablet Oral QHS     Data Reviewed:   CBG:  No results for input(s): GLUCAP in the last 168 hours.  SpO2: 100 % O2 Flow Rate (L/min): 3 L/min FiO2 (%): 32 %    Vitals:   05/01/24 2024 05/01/24 2031 05/02/24 0058 05/02/24 0355  BP:   91/72 103/63  Pulse:   60 60  Resp:   18 18  Temp:   98.4 F (36.9 C) 98 F (36.7 C)  TempSrc:   Oral   SpO2: 94% 97% 93% 100%  Weight:      Height:          Data Reviewed:  Basic Metabolic Panel: Recent Labs  Lab 04/27/24 0338 04/30/24 0901  NA 136  --   K 4.2  --   CL 98  --   CO2 29  --   GLUCOSE 118*  --   BUN 32*  --   CREATININE 1.06 1.16  CALCIUM 9.2  --   MG 2.4  --     CBC: Recent Labs  Lab 04/27/24 0338  WBC 10.5  HGB 12.5*  HCT 35.9*  MCV 94.0  PLT 186    LFT Recent Labs  Lab 04/27/24 0338  AST 26  ALT 37  ALKPHOS 46  BILITOT 0.6  PROT 6.3*  ALBUMIN  3.2*     Antibiotics: Anti-infectives (From admission, onward)    Start     Dose/Rate Route Frequency Ordered Stop   04/17/24 2000  azithromycin (ZITHROMAX) tablet 500 mg        500 mg Oral Every 24 hours 04/17/24 1413 04/19/24 1957   04/16/24 2000  cefTRIAXone (ROCEPHIN) 2 g in sodium chloride  0.9 % 100 mL IVPB        2 g 200 mL/hr over 30 Minutes Intravenous Every 24 hours 04/16/24 0037 04/19/24 2028   04/16/24 2000  azithromycin (ZITHROMAX) 500 mg in sodium chloride  0.9 % 250 mL IVPB  Status:  Discontinued        500 mg 250 mL/hr over 60 Minutes Intravenous Every 24 hours 04/16/24 0037 04/17/24 1413   04/15/24 2145  cefTRIAXone (ROCEPHIN) 1 g in  sodium chloride  0.9 % 100 mL IVPB        1 g 200 mL/hr over 30 Minutes Intravenous  Once 04/15/24 2144 04/16/24 0028   04/15/24 2145  azithromycin (ZITHROMAX) 500 mg in sodium chloride  0.9 % 250 mL IVPB        500 mg 250 mL/hr over 60 Minutes Intravenous  Once 04/15/24 2144 04/16/24 0028        CONSULTS neurosurgery  Code Status: Full code  Family Communication: Discussed with patient's daughter at bedside     Subjective   Pain well-controlled.  Awaiting for surgery today.  Objective    Physical Examination:  General-appears in no acute distress Heart-S1-S2, regular, no murmur auscultated Lungs-clear to auscultation bilaterally, no wheezing or crackles auscultated Abdomen-soft, nontender, no organomegaly Extremities-no edema in the lower extremities Neuro-alert, oriented x3, no focal deficit noted   Status is: Inpatient:             Sabas GORMAN Brod   [facility] If 7PM-7AM, please contact night-coverage at [email or web address], Office  [phone]   05/02/2024, 8:22 AM  LOS: 16 days

## 2024-05-02 NOTE — H&P (Signed)
 Subjective: Patient is a 83 y.o. male admitted for L leg pain. Onset of symptoms was several weeks ago, rapidly worsening since that time.  The pain is rated intense, and is located at the across the lower back and radiates to LLE. The pain is described as aching and occurs all day. The symptoms have been progressive. Symptoms are exacerbated by exercise and standing. 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   Atherosclerosis of native artery of both lower extremities 01/31/2016   Atrial flutter by electrocardiogram (HCC) 12/10/2016   Bilateral carotid artery stenosis 01/31/2016   CAD (coronary artery disease)    a. 01/2020- DFR-guided CSI orbital atherectomy with DES PCI of proximal LAD with staged orbital atherectomy and DES PCI of the mid and distal LCx.   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   GERD (gastroesophageal reflux disease)    H/O varicella 12/11/2017   History of atrial fibrillation 12/10/2016   HTN (hypertension) 02/08/2020   Hypertension    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   Lumbar spondylosis 11/06/2014   Neck mass 07/15/2022   Nicotine dependence, uncomplicated 09/26/2015    Nocturnal hypoxemia 03/04/2016   PAF (paroxysmal atrial fibrillation) (HCC) 12/10/2016   Peripheral vascular disease    Pneumonia 2021   S/P lumbar fusion 10/20/2022   S/P lumbar laminectomy 10/21/2022   Sebaceous cyst 07/15/2022   Shortness of breath    USES OXYGEN  AT NIGHT--HX OF RIGHT LOWER LOBE PULMONARY NODULE--FOLLOWED BY PT'S MEDICAL DOCTOR AND HAS HAD FOR YEARS   Smoking greater than 40 pack years 03/04/2016   Spinal stenosis of lumbar region 10/02/2014   Spondylolisthesis 10/16/2014   Syncope 08/04/2016    Past Surgical History:  Procedure Laterality Date   ATHERECTOMY  01/31/2020   Successful DFR guided, CSI Orbital Atherectomy-DES PCI of proximal LA   BACK SURGERY     LOWER BACK SURGERY X 3 - FUSION   CARPAL TUNNEL RELEASE AND SURGERY LEFT ELBOW     CATARACT EXTRACTION     CERVICAL DISC SURGERY     FUSION - ONLY SLIGHT LIMITATION IN NECK MOVEMENT   CORONARY ATHERECTOMY N/A 01/31/2020   Procedure: CORONARY ATHERECTOMY;  Surgeon: Anner Alm ORN, MD;  Location: [facility] INVASIVE CV LAB;  Service: Cardiovascular;  Laterality: N/A;   CORONARY ATHERECTOMY N/A 02/08/2020   Procedure: CORONARY ATHERECTOMY;  Surgeon: Anner Alm ORN, MD;  Location: [facility] INVASIVE CV LAB;  Service: Cardiovascular;  Laterality: N/A;   CORONARY PRESSURE/FFR STUDY N/A 01/31/2020   Procedure: INTRAVASCULAR PRESSURE WIRE/FFR STUDY;  Surgeon: Anner Alm ORN, MD;  Location: [facility] INVASIVE CV LAB;  Service: Cardiovascular;  Laterality: N/A;   CORONARY STENT INTERVENTION N/A 01/31/2020  Procedure: CORONARY STENT INTERVENTION;  Surgeon: Anner Alm ORN, MD;  Location: [facility] INVASIVE CV LAB;  Service: Cardiovascular;  Laterality: N/A;   LAMINECTOMY WITH POSTERIOR LATERAL ARTHRODESIS LEVEL 3 Bilateral 10/20/2022   Procedure: Laminectomy and Foraminotomy - Lumbar two-Lumbar three - bilateral - Lumbar three-Lumbar four - right - Lumbar four-Lumbar five - right, posterolateral fusion Lumbar three-five with pedicle screw  fixation Lumbar three-five;  Surgeon: Joshua Alm RAMAN, MD;  Location: [facility] OR;  Service: Neurosurgery;  Laterality: Bilateral;   LEFT HEART CATH N/A 02/08/2020   Procedure: Left Heart Cath;  Surgeon: Anner Alm ORN, MD;  Location: [facility] INVASIVE CV LAB;  Service: Cardiovascular;  Laterality: N/A;   LEFT HEART CATH AND CORONARY ANGIOGRAPHY N/A 01/31/2020   Procedure: LEFT HEART CATH AND CORONARY ANGIOGRAPHY;  Surgeon: Anner Alm ORN, MD;  Location: [facility] INVASIVE CV LAB;  Service: Cardiovascular;  Laterality: N/A;   POSTERIOR CERVICAL FUSION/FORAMINOTOMY Bilateral 09/30/2012   Procedure: CERVICAL SEVEN AND THORACIC ONE BILATERAL POSTERIOR CERVICAL FUSION/FORAMINOTOMY ;  Surgeon: Alm RAMAN Joshua, MD;  Location: [facility] NEURO ORS;  Service: Neurosurgery;  Laterality: Bilateral;   RIGHT SHOULDER SURGERY     SINUS SURGERY WITH INSTATRAK     TOTAL HIP ARTHROPLASTY  04/02/2012   Procedure: TOTAL HIP ARTHROPLASTY ANTERIOR APPROACH;  Surgeon: Lonni CINDERELLA Poli, MD;  Location: [facility] ORS;  Service: Orthopedics;  Laterality: Right;  Right Total Hip Arthroplasty   VASCULAR SURGERY     STENT PLACEMENT RIGHT LEG AND ROTOR ROOTER LEFT LEG    Prior to Admission medications   Medication Sig Start Date End Date Taking? Authorizing Provider  albuterol  (PROVENTIL ) (2.5 MG/3ML) 0.083% nebulizer solution Take 2.5 mg by nebulization every 6 (six) hours as needed for shortness of breath or wheezing. 03/29/24  Yes [provider]  amLODipine  (NORVASC ) 5 MG tablet Take 1 tablet (5 mg total) by mouth every other day. 12/15/22  Yes Monetta Redell PARAS, MD  aspirin  EC 81 MG tablet Take 81 mg by mouth daily.   Yes [provider]  BREZTRI AEROSPHERE 160-9-4.8 MCG/ACT AERO Inhale 2 puffs into the lungs 2 (two) times daily. 09/29/22  Yes [provider]  clopidogrel  (PLAVIX ) 75 MG tablet Take 1 tablet (75 mg total) by mouth daily. 03/07/21  Yes Monetta Redell PARAS, MD  docusate sodium  (COLACE) 100 MG capsule Take 100 mg by  mouth daily as needed (constipation).   Yes [provider]  ezetimibe  (ZETIA ) 10 MG tablet Take 10 mg by mouth daily.   Yes [provider]  gabapentin  (NEURONTIN ) 300 MG capsule Take 300 mg by mouth 2 (two) times daily. 11/27/22  Yes [provider]  ibuprofen  (ADVIL ) 200 MG tablet Take 200-400 mg by mouth daily as needed for headache or moderate pain.   Yes [provider]  lansoprazole (PREVACID) 15 MG capsule Take 15 mg by mouth daily at 12 noon.   Yes [provider]  methocarbamol  (ROBAXIN ) 500 MG tablet Take 1 tablet (500 mg total) by mouth every 6 (six) hours as needed for muscle spasms. 10/21/22  Yes Joshua Alm Hamilton, MD  Omega-3 Fatty Acids (FISH OIL) 1000 MG CAPS Take 1,000 mg by mouth daily.   Yes [provider]  OXYGEN  Inhale 2 L into the lungs at bedtime.   Yes [provider]  pravastatin  (PRAVACHOL ) 40 MG tablet Take 1 tablet (40 mg total) by mouth daily. 03/07/21  Yes Monetta Redell PARAS, MD  Vitamin D-Vitamin K (K2 PLUS D3 PO) Take 1 tablet  by mouth daily.   Yes [provider]  nitroGLYCERIN  (NITROSTAT ) 0.4 MG SL tablet Place 0.4 mg under the tongue every 5 (five) minutes as needed for chest pain. Patient not taking: Reported on 04/15/2024    [provider]   Allergies  Allergen Reactions   Ferra-Caps [Iron] Hives   Streptomycin Hives and Itching    Social History   Tobacco Use   Smoking status: Some Days    Current packs/day: 1.00    Average packs/day: 1 pack/day for 55.0 years (55.0 ttl pk-yrs)    Types: Cigarettes   Smokeless tobacco: Never  Substance Use Topics   Alcohol use: Yes    Alcohol/week: 21.0 standard drinks of alcohol    Types: 21 Cans of beer per week    Comment: 3 BEERS A DAY    Family History  Problem Relation Age of Onset   Cancer Father    Diabetes Sister    Diabetes Brother      Review of Systems  Positive ROS: neg  All other systems have been reviewed and  were otherwise negative with the exception of those mentioned in the HPI and as above.  Objective: Vital signs in last 24 hours: Temp:  [97.8 F (36.6 C)-98.8 F (37.1 C)] 98.1 F (36.7 C) (11/10 0905) Pulse Rate:  [52-68] 56 (11/10 0902) Resp:  [18-22] 18 (11/10 0902) BP: (91-120)/(53-72) 120/61 (11/10 0902) SpO2:  [93 %-100 %] 94 % (11/10 0902) FiO2 (%):  [32 %] 32 % (11/09 2031)  General Appearance: Alert, cooperative, no distress, appears stated age Head: Normocephalic, without obvious abnormality, atraumatic Eyes: PERRL, conjunctiva/corneas clear, EOM's intact    Neck: Supple, symmetrical, trachea midline Back: Symmetric, no curvature, ROM normal, no CVA tenderness Lungs:  respirations unlabored Heart: Regular rate and rhythm Abdomen: Soft, non-tender Extremities: Extremities normal, atraumatic, no cyanosis or edema Pulses: 2+ and symmetric all extremities Skin: Skin color, texture, turgor normal, no rashes or lesions  NEUROLOGIC:   Mental status: Alert and oriented x4,  no aphasia, good attention span, fund of knowledge, and memory Motor Exam - grossly normal Sensory Exam - grossly normal Reflexes: 1+ Coordination - grossly normal Gait - grossly normal Balance - grossly normal Cranial Nerves: I: smell Not tested  II: visual acuity  OS: nl    OD: nl  II: visual fields Full to confrontation  II: pupils Equal, round, reactive to light  III,VII: ptosis None  III,IV,VI: extraocular muscles  Full ROM  V: mastication Normal  V: facial light touch sensation  Normal  V,VII: corneal reflex  Present  VII: facial muscle function - upper  Normal  VII: facial muscle function - lower Normal  VIII: hearing Not tested  IX: soft palate elevation  Normal  IX,X: gag reflex Present  XI: trapezius strength  5/5  XI: sternocleidomastoid strength 5/5  XI: neck flexion strength  5/5  XII: tongue strength  Normal    Data Review Lab Results  Component Value Date   WBC 10.5  04/27/2024   HGB 12.5 (L) 04/27/2024   HCT 35.9 (L) 04/27/2024   MCV 94.0 04/27/2024   PLT 186 04/27/2024   Lab Results  Component Value Date   NA 136 04/27/2024   K 4.2 04/27/2024   CL 98 04/27/2024   CO2 29 04/27/2024   BUN 32 (H) 04/27/2024   CREATININE 1.16 04/30/2024   GLUCOSE 118 (H) 04/27/2024   Lab Results  Component Value Date   INR 1.0 10/14/2022  Assessment/Plan:  Estimated body mass index is 19.23 kg/m as calculated from the following:   Height as of this encounter: 5' 10.98 (1.803 m).   Weight as of this encounter: 62.5 kg. Patient admitted for L L4-5 diskectomy and re-do fusion L3-5. Patient has failed a reasonable attempt at conservative therapy.  I explained the condition and procedure to the patient and answered any questions.  Patient wishes to proceed with procedure as planned. Understands risks/ benefits and typical outcomes of procedure.   Alm GORMAN Molt 05/02/2024 11:09 AM

## 2024-05-02 NOTE — Op Note (Addendum)
 04/15/2024 - 05/02/2024  1:41 PM  PATIENT:  Corey Tate Mania  83 y.o. male  PRE-OPERATIVE DIAGNOSIS:  1.  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  POST-OPERATIVE DIAGNOSIS:  same  PROCEDURE:   1. Decompressive lumbar extraforaminal decompression with transpedicular microdiscectomy with resection of large left L4-5 foraminal disc herniation 2. Posterior fixation L4-5 left using atec cortical pedicle screws.  3. Intertransverse arthrodesis L4-5 bilaterally using morcellized autograft and allograft. 4.  Exploration of fusion L3-4 L4-5 to confirm pseudoarthrosis at L4-5 and rule out pseudoarthrosis L3-4, with removal of segmental fixation L 3-5  SURGEON:  Alm Molt, MD  ASSISTANTS: Suzen Pean, FNP  ANESTHESIA:  General  EBL: 50 ml  Total I/O In: -  Out: 50 [Blood:50]  BLOOD ADMINISTERED:none  DRAINS: none   INDICATION FOR PROCEDURE: This patient presented with severe left leg pain. Imaging revealed probable pseudoarthrosis L4-5 with a large extreme of disc herniation compressing the left L4 nerve root. The patient tried a reasonable attempt at conservative medical measures without relief. I recommended decompression and instrumented fusion to address the stenosis as well as the segmental  instability.  Patient understood the risks, benefits, and alternatives and potential outcomes and wished to proceed.  PROCEDURE DETAILS:  The patient was brought to the operating room. After induction of generalized endotracheal anesthesia the patient was rolled into the prone position on chest rolls and all pressure points were padded. The patient's lumbar region was cleaned and then prepped with DuraPrep and draped in the usual sterile fashion. Anesthesia was injected and then a dorsal midline incision was made and carried down to the lumbosacral fascia. The fascia  was opened and the paraspinous musculature was taken down in a subperiosteal fashion to expose the previously placed instrumentation L3-L5. A self-retaining retractor was placed.  We removed the locking caps from the previously placed screws and remove the rods.  The L3-L4 screws bilaterally had excellent purchase but the L5 screws were loose.  These were removed.  We then dissected out over the facets to expose the transverse processes of L4 and L5 and the lateral facets.  We explored the fusion and found no significant bone growth between the transverse processes of L4 and L5.  Also, when we pulled on the L4 pedicle screws the L5 screw holes did not move.  There was motion between the L4 screws and the L5 screw holes.  This suggested pseudoarthrosis at this level as suspected.   I then turned my attention to the decompression and I drilled the lateral part of the pars and facets at L4-5 on the left until I was down to the intertransverse ligament and the yellow ligament.  The ligament was opened and removed in a piecemeal fashion exposing underlying L4-5 disc on the left and the exiting L4 nerve root.  The L4 nerve root was severely compressed by very large extraforaminal disc herniation at L4-5 on the left.  We incised the disc and removed 2 very large fragments from the Neath the L4 nerve root.  The L4 nerve root relaxed.  We then performed a thorough intradiscal discectomy.  We then palpated with a coronary dilator into the foramen and along the roots and found no further compression of the roots.  The root was free..   We then turned our attention to the placement of the lower pedicle screws.  We tried to place a 7.5 x  45 mm pedicle screw at L5 on the right but it offered no purchase whatsoever and easily pulled out.  Therefore we decided not to place a screw at L5 on the right.  A 7.5 x 45 mm pedicle screw on the left through the old hole found excellent purchase.  My nurse practitioner assisted in  placement of the pedicle screws.  We then decorticated the transverse processes and laid a mixture of morcellized autograft and allograft out over these to perform intertransverse arthrodesis at L4-5 bilaterally. We then placed a lordotic rod into the multiaxial screw heads of the pedicle screws and locked these in position with the locking caps and anti-torque device. We then checked our construct with AP and lateral fluoroscopy. Irrigated with copious amounts of 0.5% povidone iodine solution followed by saline solution. Inspected the nerve roots once again to assure adequate decompression, lined to the dura with Gelfoam,  and then we closed the muscle and the fascia with 0 Vicryl. Closed the subcutaneous tissues with 2-0 Vicryl and subcuticular tissues with 3-0 Vicryl. The skin was closed with benzoin and Steri-Strips. Dressing was then applied, the patient was awakened from general anesthesia and transported to the recovery room in stable condition. At the end of the procedure all sponge, needle and instrument counts were correct.   PLAN OF CARE: admit to inpatient  PATIENT DISPOSITION:  PACU - hemodynamically stable.   Delay start of Pharmacological VTE agent (>24hrs) due to surgical blood loss or risk of bleeding:  yes

## 2024-05-02 NOTE — Progress Notes (Signed)
 MRSA and Staph PCR positive. Profend administered in Short Stay. Floor RN notified.

## 2024-05-02 NOTE — Plan of Care (Signed)

## 2024-05-02 NOTE — Anesthesia Procedure Notes (Signed)
 Procedure Name: Intubation Date/Time: 05/02/2024 11:58 AM  Performed by: Sherlyn Lapine, CRNAPre-anesthesia Checklist: Patient identified, Emergency Drugs available, Suction available and Patient being monitored Patient Re-evaluated:Patient Re-evaluated prior to induction Oxygen  Delivery Method: Circle System Utilized Preoxygenation: Pre-oxygenation with 100% oxygen  Induction Type: IV induction Ventilation: Mask ventilation without difficulty Laryngoscope Size: Miller and 2 Grade View: Grade I Tube type: Oral Number of attempts: 1 Airway Equipment and Method: Stylet and Oral airway Placement Confirmation: ETT inserted through vocal cords under direct vision, positive ETCO2 and breath sounds checked- equal and bilateral Tube secured with: Tape Dental Injury: Teeth and Oropharynx as per pre-operative assessment

## 2024-05-02 NOTE — Transfer of Care (Signed)
 Immediate Anesthesia Transfer of Care Note  Patient: Corey Tate  Procedure(s) Performed: LAMINECTOMY WITH POSTERIOR LATERAL ARTHRODESIS LUMBAR FOUR-FIVE; EXPLORATION OF LUMBAR THREE-FIVE (Back)  Patient Location: PACU  Anesthesia Type:General  Level of Consciousness: drowsy  Airway & Oxygen  Therapy: Patient Spontanous Breathing and Patient connected to nasal cannula oxygen   Post-op Assessment: Report given to RN and Post -op Vital signs reviewed and stable  Post vital signs: Reviewed and stable  Last Vitals:  Vitals Value Taken Time  BP 124/55 05/02/24 14:00  Temp    Pulse 60 05/02/24 14:02  Resp 27 05/02/24 14:02  SpO2 95 % 05/02/24 14:02  Vitals shown include unfiled device data.  Last Pain:  Vitals:   05/02/24 0902  TempSrc:   PainSc: 4       Patients Stated Pain Goal: 0 (05/01/24 1458)  Complications: No notable events documented.

## 2024-05-02 NOTE — Anesthesia Postprocedure Evaluation (Signed)
 Anesthesia Post Note  Patient: CHRISTEN BEDOYA  Procedure(s) Performed: LAMINECTOMY WITH POSTERIOR LATERAL ARTHRODESIS LUMBAR FOUR-FIVE; EXPLORATION OF LUMBAR THREE-FIVE (Back)     Patient location during evaluation: PACU Anesthesia Type: General Level of consciousness: awake and alert, patient cooperative and oriented Pain management: pain level controlled Vital Signs Assessment: post-procedure vital signs reviewed and stable Respiratory status: spontaneous breathing, nonlabored ventilation, respiratory function stable and patient connected to nasal cannula oxygen  Cardiovascular status: blood pressure returned to baseline and stable Postop Assessment: no apparent nausea or vomiting Anesthetic complications: no   No notable events documented.  Last Vitals:  Vitals:   05/02/24 1430 05/02/24 1500  BP: (!) 120/51 115/62  Pulse: 60 (!) 120  Resp: 20 20  Temp: 36.5 C 36.6 C  SpO2: 96% 91%    Last Pain:  Vitals:   05/02/24 1430  TempSrc:   PainSc: 0-No pain                 Christabella Alvira,E. Rhylen Shaheen

## 2024-05-02 NOTE — Anesthesia Preprocedure Evaluation (Addendum)
 Anesthesia Evaluation  Patient identified by MRN, date of birth, ID band Patient awake    Reviewed: Allergy & Precautions, NPO status , Patient's Chart, lab work & pertinent test results  History of Anesthesia Complications Negative for: history of anesthetic complications  Airway Mallampati: II  TM Distance: >3 FB Neck ROM: Full    Dental  (+) Edentulous Upper, Edentulous Lower   Pulmonary shortness of breath, COPD,  COPD inhaler and oxygen  dependent, Current Smoker and Patient abstained from smoking. R lung cancer   breath sounds clear to auscultation       Cardiovascular hypertension, Pt. on medications (-) angina + CAD, + Cardiac Stents (LAD, Cx) and + Peripheral Vascular Disease  + dysrhythmias Atrial Fibrillation  Rhythm:Regular Rate:Normal     Neuro/Psych   Anxiety     Back pain    GI/Hepatic Neg liver ROS,GERD  Medicated and Controlled,,  Endo/Other  negative endocrine ROS    Renal/GU Renal InsufficiencyRenal disease     Musculoskeletal  (+) Arthritis ,    Abdominal   Peds  Hematology Plavix  Hb 12.5, plt 186k   Anesthesia Other Findings   Reproductive/Obstetrics                              Anesthesia Physical Anesthesia Plan  ASA: 3  Anesthesia Plan: General   Post-op Pain Management: Tylenol  PO (pre-op)*   Induction: Intravenous  PONV Risk Score and Plan: 1 and Ondansetron  and Dexamethasone   Airway Management Planned: Oral ETT  Additional Equipment: None  Intra-op Plan:   Post-operative Plan: Extubation in OR  Informed Consent: I have reviewed the patients History and Physical, chart, labs and discussed the procedure including the risks, benefits and alternatives for the proposed anesthesia with the patient or authorized representative who has indicated his/her understanding and acceptance.       Plan Discussed with: CRNA and Surgeon  Anesthesia Plan  Comments:          Anesthesia Quick Evaluation

## 2024-05-03 ENCOUNTER — Other Ambulatory Visit (HOSPITAL_COMMUNITY): Payer: Self-pay

## 2024-05-03 DIAGNOSIS — M62838 Other muscle spasm: Secondary | ICD-10-CM | POA: Diagnosis not present

## 2024-05-03 DIAGNOSIS — I25119 Atherosclerotic heart disease of native coronary artery with unspecified angina pectoris: Secondary | ICD-10-CM | POA: Diagnosis not present

## 2024-05-03 DIAGNOSIS — M549 Dorsalgia, unspecified: Secondary | ICD-10-CM | POA: Diagnosis not present

## 2024-05-03 DIAGNOSIS — M5440 Lumbago with sciatica, unspecified side: Secondary | ICD-10-CM | POA: Diagnosis not present

## 2024-05-03 MED ORDER — OXYCODONE HCL 5 MG PO TABS
5.0000 mg | ORAL_TABLET | Freq: Four times a day (QID) | ORAL | 0 refills | Status: AC | PRN
  Filled 2024-05-03: qty 15, 4d supply, fill #0

## 2024-05-03 MED ORDER — CHLORHEXIDINE GLUCONATE 4 % EX SOLN
1.0000 | CUTANEOUS | 1 refills | Status: AC
  Filled 2024-05-03: qty 946, 30d supply, fill #0

## 2024-05-03 MED ORDER — CLOPIDOGREL BISULFATE 75 MG PO TABS: 75.0000 mg | ORAL_TABLET | Freq: Every day | ORAL | Status: AC

## 2024-05-03 MED ORDER — MUPIROCIN 2 % EX OINT
1.0000 | TOPICAL_OINTMENT | Freq: Two times a day (BID) | CUTANEOUS | 0 refills | Status: AC
  Filled 2024-05-03: qty 66, 30d supply, fill #0

## 2024-05-03 NOTE — Progress Notes (Signed)
 Patient ID: Corey Tate, male   DOB: 09/22/1940, 83 y.o.   MRN: [id number] Doing well, back sore, no leg pain, walked to BR, good strength. Home when cleared by medicine for discharge

## 2024-05-03 NOTE — Discharge Summary (Signed)
 Physician Discharge Summary   Patient: Corey Tate MRN: [id number] DOB: 1941-03-17  Admit date:     04/15/2024  Discharge date: 05/03/24  Discharge Physician: Sabas GORMAN Brod   PCP: Silvano Angeline FALCON, NP   Recommendations at discharge:   Follow-up neurosurgery as outpatient Patient to be discharged with home health PT Hold Plavix  for 5 days, starting from 05/08/2024, discussed with neurosurgery PA, Suzen Continue taking aspirin   Discharge Diagnoses: Principal Problem:   Back pain Active Problems:   Coronary artery disease involving native coronary artery of native heart with angina pectoris   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 *   83 year old male with medical history of chronic atrial fibrillation not on anticoagulation, hypertension, severe COPD, PVD status post left CEA and right EIA angioplasty, active smoker, history of lung cancer on radiation, OSA on 2 L of oxygen , not on CPAP presented with severe back pain.  In the ED CTA chest abdomen/pelvis shows diffuse atherosclerotic disease but no dissection and no bony changes to thoracic or lumbar spine. Patient required 3 doses of IV opioids and was admitted to the hospital for pain control.  Neurosurgery was consulted      Hospital Course:      Intractable back pain - Presented with worsening back pain - MRI of lumbar spine showed L4-5, new left foraminal disc extrusion with severe increased left neuroforaminal stenosis. - Neurosurgery reviewed MRI and recommended pain control, steroids, if pain not controlled plan for left L4-5 transforaminal epidural steroid injection.   IR was  consulted -Patient was supposed to  get epidural steroid injection today however IR PA called and said that patient does not have adequate epidural fat for the injection.  He will be referred to outpatient neuroradiology for epidural steroid injection. -Patient was awaiting to go to skilled facility for rehab, referral was made for outpatient ESI per neuroradiology -Patient's pain continued to get worse so neurosurgery again saw patient and plan for surgery in the hospital - He underwent decompressive lumbar extraforaminal decompression with resection of large L4-5 foraminal disc herniation, posterior fixation L4-5 using ATEC cortical pedicle screws - Patient can be discharged as per neurosurgery - Home health PT has been ordered  CAD involving native coronary artery with angina pectoris  Peripheral vascular disease/hypertension - Continue amlodipine , Zetia , pravastatin  - Aspirin  and Plavix  were held f - Discussed with neurosurgery, Plavix  can be started after 5 days -Aspirin  can be started from  tomorrow   Obstructive sleep apnea - Not on CPAP   Cancer of upper lobe of right lung - Receiving radiation treatment - Alk phos is normal - CT ruled out osseous metastasis   Paroxysmal atrial fibrillation - Not on anticoagulation - Rate is controlled, continue telemetry   Severe COPD Chronic respiratory failure with hypoxemia -Continue home oxygen  2 L/min - Continue Breztri   Hypertension - Blood pressure has been soft in the hospital, amlodipine  5 mg has been discontinued         Consultants: Neurosurgery Procedures performed: Spine surgery as above Disposition: Home Diet recommendation:  Discharge Diet Orders (From admission, onward)     Start     Ordered   05/03/24 0000  Diet -  low sodium heart healthy        05/03/24 1413           Regular diet DISCHARGE MEDICATION: Allergies as of 05/03/2024       Reactions   Ferra-caps [iron] Hives   Streptomycin Hives, Itching        Medication List      STOP taking these medications    amLODipine  5 MG tablet Commonly known as: NORVASC    ibuprofen  200 MG tablet Commonly known as: ADVIL        TAKE these medications    albuterol  (2.5 MG/3ML) 0.083% nebulizer solution Commonly known as: PROVENTIL  Take 2.5 mg by nebulization every 6 (six) hours as needed for shortness of breath or wheezing.   aspirin  EC 81 MG tablet Take 81 mg by mouth daily.   Breztri Aerosphere 160-9-4.8 MCG/ACT Aero inhaler Generic drug: budesonide-glycopyrrolate -formoterol  Inhale 2 puffs into the lungs 2 (two) times daily.   chlorhexidine  4 % external liquid Commonly known as: HIBICLENS  Apply 15 mLs (1 Application total) topically as directed for 30 doses. Use as directed daily for 5 days every other week for 6 weeks.   clopidogrel  75 MG tablet Commonly known as: Plavix  Take 1 tablet (75 mg total) by mouth daily. Starting Plavix  from 05/08/2024 Start taking on: May 08, 2024 What changed:  additional instructions These instructions start on May 08, 2024. If you are unsure what to do until then, ask your doctor or other care provider.   docusate sodium  100 MG capsule Commonly known as: COLACE Take 100 mg by mouth daily as needed (constipation).   ezetimibe  10 MG tablet Commonly known as: ZETIA  Take 10 mg by mouth daily.   Fish Oil 1000 MG Caps Take 1,000 mg by mouth daily.   gabapentin  300 MG capsule Commonly known as: NEURONTIN  Take 300 mg by mouth 2 (two) times daily.   K2 PLUS D3 PO Take 1 tablet by mouth daily.   lansoprazole 15 MG capsule Commonly known as: PREVACID Take 15 mg by mouth daily at 12 noon.   methocarbamol  500 MG tablet Commonly known as: ROBAXIN  Take 1 tablet (500 mg total) by mouth every 6 (six) hours as needed for muscle spasms.   mupirocin ointment 2 % Commonly known as: BACTROBAN Place 1 Application into the nose 2 (two) times daily for 60 doses. Use as directed 2 times daily for 5 days every other  week for 6 weeks.   nitroGLYCERIN  0.4 MG SL tablet Commonly known as: NITROSTAT  Place 0.4 mg under the tongue every 5 (five) minutes as needed for chest pain.   oxyCODONE  5 MG immediate release tablet Commonly known as: Oxy IR/ROXICODONE  Take 1 tablet (5 mg total) by mouth every 6 (six) hours as needed for moderate pain (pain score 4-6).   OXYGEN  Inhale 2 L into the lungs at bedtime.   pravastatin  40 MG tablet Commonly known as: PRAVACHOL  Take 1 tablet (40 mg total) by mouth daily.               Discharge Care Instructions  (From admission, onward)           Start     Ordered   05/03/24 0000  Discharge wound care:       Comments: As directed   05/03/24 1413            Follow-up Information     [facility]  Follow up.   Why: [facility]  services. Contact information: [address] [place] [address] [phone]                Discharge Exam: Fredricka Weights   04/16/24 0015 04/16/24 0145 04/16/24 0226  Weight: 62.6 kg 62.5 kg 62.5 kg   General-appears in no acute distress Heart-S1-S2, regular, no murmur auscultated Lungs-clear to auscultation bilaterally, no wheezing or crackles auscultated Abdomen-soft, nontender, no organomegaly Extremities-no edema in the lower extremities Neuro-alert, oriented x3, no focal deficit noted  Condition at discharge: good  The results of significant diagnostics from this hospitalization (including imaging, microbiology, ancillary and laboratory) are listed below for reference.   Imaging Studies: DG Lumbar Spine 2-3 Views Result Date: 05/02/2024 CLINICAL DATA:  Elective surgery. EXAM: LUMBAR SPINE - 2-3 VIEW COMPARISON:  Preoperative imaging FINDINGS: Four fluoroscopic spot views of the lumbar spine submitted from the operating room. Previous L3 through L5 fusion hardware. Some of the surgical hardware is removed on the final image.  Fluoroscopy time 13 seconds. Dose 7.76 mGy. IMPRESSION: Intraoperative fluoroscopy during lumbar surgery. Electronically Signed   By: Andrea Gasman M.D.   On: 05/02/2024 16:12   DG C-Arm 1-60 Min-No Report Result Date: 05/02/2024 Fluoroscopy was utilized by the requesting physician.  No radiographic interpretation.   DG C-Arm 1-60 Min-No Report Result Date: 05/02/2024 Fluoroscopy was utilized by the requesting physician.  No radiographic interpretation.   CT ABDOMEN PELVIS W CONTRAST Result Date: 05/01/2024 EXAM: CT ABDOMEN AND PELVIS WITH CONTRAST 05/01/2024 12:47:16 AM TECHNIQUE: CT of the abdomen and pelvis was performed with the administration of intravenous contrast. Multiplanar reformatted images are provided for review. Automated exposure control, iterative reconstruction, and/or weight-based adjustment of the mA/kV was utilized to reduce the radiation dose to as low as reasonably achievable. CONTRAST: 75mL of Omnipaque  350. COMPARISON: CT Chest Abdomen Pelvis 07/28/2023. CLINICAL HISTORY: Bowel obstruction suspected. FINDINGS: LOWER CHEST: Lung bases demonstrate increased right middle lobe atelectasis posteriorly. No parenchymal nodule is noted. LIVER: Fatty infiltration of the liver is noted. GALLBLADDER AND BILE DUCTS: The gallbladder is well distended without evidence of cholelithiasis. No biliary ductal dilatation. SPLEEN: No acute abnormality. PANCREAS: No acute abnormality. ADRENAL GLANDS: Mild thickening of the left adrenal gland is again noted without a focal mass. KIDNEYS, URETERS AND BLADDER: The kidneys are well visualized with a normal enhancement pattern. A punctate left renal stone is noted. Ureters are within normal limits. The bladder is well distended. GI AND BOWEL: Stomach demonstrates no acute abnormality. Small bowel is within normal limits. No obstructive or inflammatory changes of the colon are seen. Appendix appears within normal limits. There is no bowel obstruction.  PERITONEUM AND RETROPERITONEUM: No ascites. No free air. VASCULATURE: Aortic calcifications are seen within infrarenal aortic dilatation to 3.7 cm. Mural thrombus is noted. LYMPH NODES: No lymphadenopathy. REPRODUCTIVE ORGANS: The prostate is within normal limits. BONES AND SOFT TISSUES: Right hip prosthesis is seen. Postsurgical changes in the lower lumbar spine are noted. No acute osseous abnormality. No focal soft tissue abnormality. IMPRESSION: 1. No evidence of bowel obstruction. 2. 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 Recommend surveillance ultrasound in 3 years. Electronically signed by: Oneil Devonshire MD 05/01/2024 01:23 AM EST RP Workstation: [id number]   DG Chest 1 View Result Date: 04/30/2024 EXAM: 1 VIEW(S) XRAY OF THE CHEST 04/30/2024 08:47:00 PM COMPARISON: None available. CLINICAL HISTORY: Hypoxemia FINDINGS: LUNGS AND PLEURA: Asymmetric elevation of right hemidiaphragm. Right basilar patchy opacities, figure atelectasis. No pulmonary edema. No pleural effusion.  No pneumothorax. HEART AND MEDIASTINUM: Atherosclerotic calcifications. No acute abnormality of the cardiac and mediastinal silhouettes. BONES AND SOFT TISSUES: Intact cervical spinal fixation hardware. No acute osseous abnormality. IMPRESSION: 1. Right basilar patchy opacities, atelectasis versus pneumonia. 2. Asymmetric elevation of the right hemidiaphragm. Electronically signed by: Norman Gatlin MD 04/30/2024 08:57 PM EST RP Workstation: [id number]   DG Abd 1 View Result Date: 04/30/2024 EXAM: 1 VIEW XRAY OF THE ABDOMEN 04/30/2024 08:47:00 PM COMPARISON: None available. CLINICAL HISTORY: Vomiting [id number]; [id number] Abdominal distension [id number] FINDINGS: BOWEL: Nonobstructive bowel gas pattern. Gaseous distension of the stomach. Gaseous distension of colon in the right hemiabdomen. Moderate burden of stool in the distal colon. SOFT TISSUES: No opaque urinary calculi. BONES: Lower  lumbar fusion. IMPRESSION: 1. Gaseous distension of the stomach and bowel loops in the right hemiabdomen with moderate stool in the descending colon. Findings favor ileus / constipation. If there is ongoing concern for obstruction, CT is recommended. Electronically signed by: Norman Gatlin MD 04/30/2024 08:55 PM EST RP Workstation: [id number]   MR LUMBAR SPINE WO CONTRAST Result Date: 04/18/2024 EXAM: MRI LUMBAR SPINE 04/18/2024 12:01:00 PM TECHNIQUE: Multiplanar multisequence MRI of the lumbar spine was performed without the administration of intravenous contrast. COMPARISON: Thoracic and lumbar spine CT 04/15/2024. Lumbar MRI 02/12/2023. CLINICAL HISTORY: 83 year old male  Low back pain, prior surgery, new symptoms. FINDINGS: BONES AND ALIGNMENT: Normal lumbar segmentation recently on CT, the same numbering system used on the previous MRI. Grade 1 anterolisthesis of L4 on L5 has increased since last year. Previous L3-L5 fusion with posterior hardware without adverse features. Normal vertebral body heights. Bone marrow signal is unremarkable. No convincing marrow edema. Intact visible sacroiliac joints. SPINAL CORD: The conus terminates normally at T12-L1. No signal abnormality in the visible lower thoracic spinal cord or conus. SOFT TISSUES: Abnormal abdominal aorta stable from recent CT / CTA. Lumbar paraspinal postoperative changes with no adverse features. There is a degree of congenital spinal canal narrowing due to short pedicle distance (such as series 5 image 8 at the L1 level). No visible lower thoracic disc degeneration superimposed. L1-L2: Circumferential disc bulge asymmetric to the right with moderate posterior element hypertrophy. Moderate spinal stenosis stable from last year. L2-L3: Circumferential disc osteophyte complex asymmetric to the left. Evidence of previous decompression, laminectomy here with mild residual spinal stenosis, stable from last year. Moderate left L2 neural foraminal  stenosis is stable. L3-L4: Chronic decompression and fusion is stable. Moderate residual L3 neural foraminal stenosis is stable. L4-L5: Chronic decompression and fusion is stable. However, large new broad based left foraminal disc extrusion since the previous MRI (series 5 image 32 and series 2 image 13). Severe increased left L4 neural foraminal stenosis. Symptomatic level favored to be L4-L5. L5-S1: Stable chronic decompression L5 fusion hardware. Moderate residual facet hypertrophy. Chronic circumferential disc osteophyte complex. No spinal stenosis. Chronic architectural distortion at the left lateral recess is stable (descending left S1 nerve root). Moderate bilateral L5 neural foraminal stenosis is stable. IMPRESSION: 1. Symptomatic level favored to be L4-L5; Large new left foraminal disc extrusion with severe increased left neural foraminal stenosis. Query left L4 radiculitis. 2. Previous lumbar decompression and fusion L3 through L5. Progression of L4-L5 grade 1 anterolisthesis. 3. 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 4. Abnormal abdominal Aorta as per recent CTA. Electronically signed by: Helayne Hurst MD 04/18/2024 12:15 PM EDT RP Workstation: [id number]   CT L-SPINE NO CHARGE Result Date: 04/15/2024 CLINICAL DATA:  Flank and  back pain radiating to the legs EXAM: CT Thoracic and Lumbar spine without contrast TECHNIQUE: Multiplanar CT images of the thoracic and lumbar spine were reconstructed from contemporary CT of the Chest, Abdomen, and Pelvis. RADIATION DOSE REDUCTION: This exam was performed according to the departmental dose-optimization program which includes automated exposure control, adjustment of the mA and/or kV according to patient size and/or use of iterative reconstruction technique. CONTRAST:  None or No additional COMPARISON:  Chest CT 02/03/2024, MRI 02/12/2023 FINDINGS: CT THORACIC SPINE FINDINGS Alignment: Normal. Vertebrae: No  acute fracture or focal pathologic process. Paraspinal and other soft tissues: No acute finding Disc levels: Partial ankylosis T5-T6 disc space. Multilevel degenerative osteophyte. No high-grade canal stenosis. CT LUMBAR SPINE FINDINGS Segmentation: 5 lumbar type vertebrae. Alignment: Grade 1 anterolisthesis L4 on L5. Vertebrae: No acute fracture or focal pathologic process. Paraspinal and other soft tissues: No acute finding Disc levels: Posterior spinal instrumentation L3 through L5. Lucency about the pedicular screws at L5. At L1-L2, disc bulge, moderate severe facet arthropathy and moderate severe canal stenosis. No high-grade foraminal narrowing. At L2-L3, disc space narrowing. Diffuse disc bulge. Moderate severe facet degenerative changes. At least mild canal stenosis. Posterior decompression changes. At L3-L4, posterior decompression changes. Limited by hardware artifact. Disc bulge and advanced facet degenerative changes. Mild canal stenosis. At L4-L5, posterior decompression changes. Artifact from hardware limits assessment of the canal. At L5-S1, disc space narrowing. Moderate facet degenerative changes. No high-grade canal stenosis. Moderate severe bilateral foraminal narrowing. IMPRESSION: 1. No CT evidence for acute osseous abnormality of the thoracic or lumbar spine. 2. Posterior spinal instrumentation L3 through L5. Lucency about the pedicular screws at L5 correlate for loosening. 3. Multilevel degenerative changes of the lumbar spine as described above. Electronically Signed   By: Luke Bun M.D.   On: 04/15/2024 20:25   CT T-SPINE NO CHARGE Result Date: 04/15/2024 CLINICAL DATA:  Flank and back pain radiating to the legs EXAM: CT Thoracic and Lumbar spine without contrast TECHNIQUE: Multiplanar CT images of the thoracic and lumbar spine were reconstructed from contemporary CT of the Chest, Abdomen, and Pelvis. RADIATION DOSE REDUCTION: This exam was performed according to the departmental  dose-optimization program which includes automated exposure control, adjustment of the mA and/or kV according to patient size and/or use of iterative reconstruction technique. CONTRAST:  None or No additional COMPARISON:  Chest CT 02/03/2024, MRI 02/12/2023 FINDINGS: CT THORACIC SPINE FINDINGS Alignment: Normal. Vertebrae: No acute fracture or focal pathologic process. Paraspinal and other soft tissues: No acute finding Disc levels: Partial ankylosis T5-T6 disc space. Multilevel degenerative osteophyte. No high-grade canal stenosis. CT LUMBAR SPINE FINDINGS Segmentation: 5 lumbar type vertebrae. Alignment: Grade 1 anterolisthesis L4 on L5. Vertebrae: No acute fracture or focal pathologic process. Paraspinal and other soft tissues: No acute finding Disc levels: Posterior spinal instrumentation L3 through L5. Lucency about the pedicular screws at L5. At L1-L2, disc bulge, moderate severe facet arthropathy and moderate severe canal stenosis. No high-grade foraminal narrowing. At L2-L3, disc space narrowing. Diffuse disc bulge. Moderate severe facet degenerative changes. At least mild canal stenosis. Posterior decompression changes. At L3-L4, posterior decompression changes. Limited by hardware artifact. Disc bulge and advanced facet degenerative changes. Mild canal stenosis. At L4-L5, posterior decompression changes. Artifact from hardware limits assessment of the canal. At L5-S1, disc space narrowing. Moderate facet degenerative changes. No high-grade canal stenosis. Moderate severe bilateral foraminal narrowing. IMPRESSION: 1. No CT evidence for acute osseous abnormality of the thoracic or lumbar spine. 2. Posterior spinal instrumentation  L3 through L5. Lucency about the pedicular screws at L5 correlate for loosening. 3. Multilevel degenerative changes of the lumbar spine as described above. Electronically Signed   By: Luke Bun M.D.   On: 04/15/2024 20:25   CT Angio Chest/Abd/Pel for Dissection W and/or Wo  Contrast Result Date: 04/15/2024 CLINICAL DATA:  Knife-like back pain history of known AAA EXAM: CT ANGIOGRAPHY CHEST, ABDOMEN AND PELVIS TECHNIQUE: Non-contrast CT of the chest was initially obtained. Multidetector CT imaging through the chest, abdomen and pelvis was performed using the standard protocol during bolus administration of intravenous contrast. Multiplanar reconstructed images and MIPs were obtained and reviewed to evaluate the vascular anatomy. RADIATION DOSE REDUCTION: This exam was performed according to the departmental dose-optimization program which includes automated exposure control, adjustment of the mA and/or kV according to patient size and/or use of iterative reconstruction technique. CONTRAST:  75mL OMNIPAQUE  IOHEXOL  350 MG/ML SOLN COMPARISON:  Chest CT 02/03/2024, PET CT 08/27/2022, CT angiography 01/26/2024 FINDINGS: CTA CHEST FINDINGS Cardiovascular: Non contrasted images of the chest demonstrate no acute intramural hematoma. Advanced aortic atherosclerosis. No aneurysm or dissection. Multi vessel coronary vascular calcification. Normal cardiac size. No pericardial effusion. Mediastinum/Nodes: Patent trachea. No thyroid mass. No suspicious lymph nodes. Esophagus within normal limits. Lungs/Pleura: Emphysema. Interim development of heterogeneous right lower lobe airspace disease and ground-glass density. Inferior right upper lobe irregular pulmonary nodule measuring about 8 x 5 mm on series 7, image 95, previously 9 x 5 mm. No new pulmonary nodule, pleural effusion, or pneumothorax. Debris within the right lower lobe bronchi. Bronchial wall thickening. Fibrosis and scarring medial left base Musculoskeletal: Sternum appears intact. No acute osseous abnormality. See separately dictated spine CT Review of the MIP images confirms the above findings. CTA ABDOMEN AND PELVIS FINDINGS VASCULAR Aorta: Atherosclerosis. No dissection. No occlusive disease. Infrarenal abdominal aortic aneurysm  measuring 3.9 cm maximum, previously 3.8 cm. Prominent intraluminal thrombus. Negative for retroperitoneal hematoma or surrounding stranding. Celiac: Moderate stenosis at the origin. Diffuse calcific and noncalcific plaque. Distal flow enhancement present. No aneurysm SMA: Heavily calcified at the origin with at least moderate stenosis. Distal flow enhancement present. Renals: 2 right and single left renal arteries. Dominant superior right renal artery and the main left renal artery demonstrate heavy calcific disease with moderate severe stenosis of the proximal arteries. IMA: High-grade stenosis at the origin. Distal flow enhancement present Inflow: Negative for aneurysm or dissection. Advanced atherosclerosis. Severe stenosis that the origin of the right internal iliac artery with probable short segment chronic occlusion. Severe diffuse disease of the external iliac vessels with at least moderate stenosis of the distal right external iliac artery. Stenotic appearing bilateral SFA origins. Veins: Suboptimally assessed Review of the MIP images confirms the above findings. NON-VASCULAR Hepatobiliary: No focal liver abnormality is seen. No gallstones, gallbladder wall thickening, or biliary dilatation. Pancreas: Unremarkable. No pancreatic ductal dilatation or surrounding inflammatory changes. Spleen: Normal in size without focal abnormality. Adrenals/Urinary Tract: Thickened left adrenal gland without mass. Normal right adrenal gland. No hydronephrosis. Nonobstructing small left-sided kidney stone. The bladder is unremarkable Stomach/Bowel: The stomach is nonenlarged. There is no dilated small bowel. No acute bowel wall thickening Lymphatic: No suspicious lymph nodes Reproductive: Negative prostate partially obscured Other: Negative for ascites or free air Musculoskeletal: Right hip replacement with artifact. Hardware in the spine, see separately dictated spine CT. Review of the MIP images confirms the above  findings. IMPRESSION: 1. Negative for acute aortic dissection. 2. Infrarenal abdominal aortic aneurysm measuring 3.9 cm maximum, previously 3.8  cm. Recommend follow-up ultrasound every 3 years. (Ref.: J Vasc Surg. 2018; 67:2-77 and J Am Coll Radiol 2013;10(10):789-794.) 3. Interim development of heterogeneous right lower lobe airspace disease and ground-glass density, suspect for pneumonia, possible aspiration. Debris within the right lower lobe bronchi. 4. Emphysema. Stable irregular right upper lobe pulmonary nodule. Reference chest CT 02/03/2024 5. Advanced aortic atherosclerosis. 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 At least moderate stenosis of the proximal renal arteries. 6. Nonobstructing left kidney stone. Aortic Atherosclerosis (ICD10-I70.0) and Emphysema (ICD10-J43.9). Electronically Signed   By: Luke Bun M.D.   On: 04/15/2024 20:08    Microbiology: Results for orders placed or performed during the hospital encounter of 04/15/24  Resp panel by RT-PCR (RSV, Flu A&B, Covid) Anterior Nasal Swab     Status: None   Collection Time: 04/15/24  4:28 PM   Specimen: Anterior Nasal Swab  Result Value Ref Range Status   SARS Coronavirus 2 by RT PCR NEGATIVE NEGATIVE Final   Influenza A by PCR NEGATIVE NEGATIVE Final   Influenza B by PCR NEGATIVE NEGATIVE Final    Comment: (NOTE) The Xpert Xpress SARS-CoV-2/FLU/RSV plus assay is intended as an aid in the diagnosis of influenza from Nasopharyngeal swab specimens and should not be used as a sole basis for treatment. Nasal washings and aspirates are unacceptable for Xpert Xpress SARS-CoV-2/FLU/RSV testing.  Fact Sheet for Patients: bloggercourse.com  Fact Sheet for Healthcare Providers: seriousbroker.it  This test is not yet approved or cleared by the [place]  FDA and has been authorized for detection and/or diagnosis of SARS-CoV-2 by FDA under an Emergency Use  Authorization (EUA). This EUA will remain in effect (meaning this test can be used) for the duration of the COVID-19 declaration under Section 564(b)(1) of the Act, 21 U.S.C. section 360bbb-3(b)(1), unless the authorization is terminated or revoked.     Resp Syncytial Virus by PCR NEGATIVE NEGATIVE Final    Comment: (NOTE) Fact Sheet for Patients: bloggercourse.com  Fact Sheet for Healthcare Providers: seriousbroker.it  This test is not yet approved or cleared by the [place]  FDA and has been authorized for detection and/or diagnosis of SARS-CoV-2 by FDA under an Emergency Use Authorization (EUA). This EUA will remain in effect (meaning this test can be used) for the duration of the COVID-19 declaration under Section 564(b)(1) of the Act, 21 U.S.C. section 360bbb-3(b)(1), unless the authorization is terminated or revoked.  Performed at [facility], [address] [place], [place] [address]   Surgical pcr screen     Status: Abnormal   Collection Time: 05/02/24  8:40 AM   Specimen: Nasal Mucosa; Nasal Swab  Result Value Ref Range Status   MRSA, PCR POSITIVE (A) NEGATIVE Final    Comment: RESULT CALLED TO, READ BACK BY AND VERIFIED WITH: RN GRETA S. [id number] AT 1134, ADC    Staphylococcus aureus POSITIVE (A) NEGATIVE Final    Comment: (NOTE) The Xpert SA Assay (FDA approved for NASAL specimens in patients 32 years of age and older), is one component of a comprehensive surveillance program. It is not intended to diagnose infection nor to guide or monitor treatment. Performed at [facility], [address], [place], [place] [address]     Labs: CBC: Recent Labs  Lab 04/27/24 0338  WBC 10.5  HGB 12.5*  HCT 35.9*  MCV 94.0  PLT 186   Basic Metabolic Panel: Recent Labs  Lab 04/27/24 0338 04/30/24 0901  NA 136  --  K 4.2  --   CL 98  --   CO2 29  --   GLUCOSE 118*  --   BUN 32*  --    CREATININE 1.06 1.16  CALCIUM 9.2  --   MG 2.4  --    Liver Function Tests: Recent Labs  Lab 04/27/24 0338  AST 26  ALT 37  ALKPHOS 46  BILITOT 0.6  PROT 6.3*  ALBUMIN  3.2*   CBG: No results for input(s): GLUCAP in the last 168 hours.  Discharge time spent: greater than 30 minutes.  Signed: Sabas GORMAN Brod, MD [facility] 05/03/2024

## 2024-05-03 NOTE — Evaluation (Addendum)
 Physical Therapy Re-Evaluation Patient Details Name: Corey Tate MRN: [id number] DOB: [birth year] Today's Date: 05/03/2024  History of Present Illness  83 yo admitted 10/24 with unrelenting back pain.S/p L4-5 laminectomy with posterior lateral arthrodesis L4-5 11/10. PMH is significant for HTN, severe COPD, PVD, s/p CEA and R EIA angioplasty, active smoker, hx of lung Ca on radiation, OSA on 2L O2 no CPAP (pt endorsing O2 at night only), CKD,  6 previous back surgeries, RTHA and R TSA   Clinical Impression  Pt seen for re-evaluation after back surgery and is moving well with reports of significantly less back pain and reduced leg pain this morning. Pt with good sensation bilaterally, slightly reduced RLE strength compared to LLE which pt reports is baseline due to PVD. He was able to complete bed mobility with supervision and cues for log roll, and is mobilizing at [facility] level for sit-stand transfers and hallway mobility. Pt needing seated rest after ~75 ft due to SOB and fatigue, but was then able to complete second bout of ambulation again with CGA. Pt hopeful for return home, will benefit from continued skilled PT to address deficits in strength, power, endurance, and dynamic stability to reduce risk of falls. Will continue to follow acutely and recommend HHPT after d/c.          If plan is discharge home, recommend the following: A little help with walking and/or transfers;A little help with bathing/dressing/bathroom;Assistance with cooking/housework;Help with stairs or ramp for entrance;Assist for transportation   Can travel by private vehicle   Yes    Equipment Recommendations Rolling walker (2 wheels)  Recommendations for Other Services       Functional Status Assessment       Precautions / Restrictions Precautions Precautions: Back;Fall Precaution Booklet Issued: Yes (comment) Recall of Precautions/Restrictions: Intact Precaution/Restrictions Comments: 2 L  O2 Restrictions Weight Bearing Restrictions Per Provider Order: No      Mobility  Bed Mobility Overal bed mobility: Needs Assistance Bed Mobility: Rolling, Sidelying to Sit, Sit to Sidelying Rolling: Supervision Sidelying to sit: Supervision     Sit to sidelying: Supervision General bed mobility comments: supervision with cues for log roll    Transfers Overall transfer level: Needs assistance Equipment used: Rolling walker (2 wheels) Transfers: Sit to/from Stand Sit to Stand: Contact guard assist           General transfer comment: able to stand without UE support, improved with RW    Ambulation/Gait Ambulation/Gait assistance: Contact guard assist Gait Distance (Feet): 75 Feet (+160ft) Assistive device: Rolling walker (2 wheels) Gait Pattern/deviations: Step-through pattern, Decreased stride length, Trunk flexed, Antalgic Gait velocity: decr Gait velocity interpretation: <1.31 ft/sec, indicative of household ambulator   General Gait Details: cues for upright posture, proximity to RW, increased work of breathing, pt attributes to RLE but recovers with seated rest      Balance Overall balance assessment: Needs assistance Sitting-balance support: Feet supported Sitting balance-Leahy Scale: Good Sitting balance - Comments: stable without support   Standing balance support: Bilateral upper extremity supported Standing balance-Leahy Scale: Fair Standing balance comment: ambulated short distance without UE support, but benefits from use of RW                             Pertinent Vitals/Pain Pain Assessment Pain Assessment: Faces Faces Pain Scale: Hurts little more Pain Location: back pain Pain Descriptors / Indicators: Aching, Discomfort, Grimacing, Radiating Pain Intervention(s): Limited activity within patient's tolerance,  Monitored during session, Repositioned       Prior Function Prior Level of Function : Independent/Modified Independent              Mobility Comments: Denies falls, uses RW ADLs Comments: Patient has had difficulty recently doing adls due to the pain in his back     Extremity/Trunk Assessment   Upper Extremity Assessment Upper Extremity Assessment: Overall WFL for tasks assessed    Lower Extremity Assessment Lower Extremity Assessment: RLE deficits/detail;LLE deficits/detail RLE Deficits / Details: grossly 4-/5 to MMT, weakest at hip flexion, knee extension, and ankle DF, reports sensation diminished in RLE (and this is baseline due to PVD) RLE Sensation: decreased light touch RLE Coordination: WNL LLE Deficits / Details: grossly 4/5 to MMT LLE Sensation: WNL LLE Coordination: WNL    Cervical / Trunk Assessment Cervical / Trunk Assessment: Back Surgery  Communication   Communication Communication: Impaired Factors Affecting Communication: Hearing impaired;Reduced clarity of speech    Cognition Arousal: Alert Behavior During Therapy: WFL for tasks assessed/performed, Impulsive   PT - Cognitive impairments: No apparent impairments                       PT - Cognition Comments: pt following commands, poor adherence to spinal precautions despite max cues Following commands: Intact       Cueing Cueing Techniques: Verbal cues     General Comments General comments (skin integrity, edema, etc.): on 2L O2 at rest, pt reports he does not use O2 when mobilizing, SpO2 to low of 93% on RA        Assessment/Plan    PT Assessment Patient needs continued PT services  PT Problem List Decreased strength;Decreased activity tolerance;Decreased balance;Decreased mobility;Decreased knowledge of use of DME;Pain       PT Treatment Interventions DME instruction;Gait training;Stair training;Functional mobility training;Therapeutic activities;Therapeutic exercise;Balance training;Neuromuscular re-education;Patient/family education;Modalities    PT Goals (Current goals can be found in the Care  Plan section)  Acute Rehab PT Goals Patient Stated Goal: reduce pain PT Goal Formulation: With patient Time For Goal Achievement: 05/17/24 Potential to Achieve Goals: Good    Frequency Min 2X/week        AM-PAC PT 6 Clicks Mobility  Outcome Measure Help needed turning from your back to your side while in a flat bed without using bedrails?: A Little Help needed moving from lying on your back to sitting on the side of a flat bed without using bedrails?: A Little Help needed moving to and from a bed to a chair (including a wheelchair)?: A Little Help needed standing up from a chair using your arms (e.g., wheelchair or bedside chair)?: A Little Help needed to walk in hospital room?: A Little Help needed climbing 3-5 steps with a railing? : A Little 6 Click Score: 18    End of Session Equipment Utilized During Treatment: Gait belt;Oxygen  (The pt is a 83 yo male presenting 11/8 with chest pain, SOB, and numbness of bilateral UE. Admitted for workup and management of NSTEMI and TIA. S/p heart cath 11/10. PMH includes: tobacco and alcohol use.) Activity Tolerance: Patient tolerated treatment well Patient left: in bed;with call bell/phone within reach;with bed alarm set;with family/visitor present Nurse Communication: Mobility status PT Visit Diagnosis: Other abnormalities of gait and mobility (R26.89);Muscle weakness (generalized) (M62.81);Difficulty in walking, not elsewhere classified (R26.2);Pain Pain - Right/Left: Left Pain - part of body: Hip;Leg    Time: 9057-8992 PT Time Calculation (min) (ACUTE ONLY): 25 min  Charges:   PT Evaluation $PT Re-evaluation: 1 Re-eval PT Treatments $Gait Training: 8-22 mins PT General Charges $$ ACUTE PT VISIT: 1 Visit         Izetta Call, PT, DPT   Acute Rehabilitation Department Office [phone] Secure Chat Communication Preferred  Izetta JULIANNA Call 05/03/2024, 1:09 PM

## 2024-05-03 NOTE — Progress Notes (Incomplete)
 [facility]  PROGRESS NOTE  Corey Tate FMW:[id number] DOB: January 29, 1941 DOA: 04/15/2024 PCP: Silvano Angeline FALCON, NP   Brief HPI:   83 year old male with medical history of chronic atrial fibrillation not on anticoagulation, hypertension, severe COPD, PVD status post left CEA and right EIA angioplasty, active smoker, history of lung cancer on radiation, OSA on 2 L of oxygen , not on CPAP presented with severe back pain.  In the ED CTA chest abdomen/pelvis shows diffuse atherosclerotic disease but no dissection and no bony changes to thoracic or lumbar spine. Patient required 3 doses of IV opioids and was admitted to the hospital for pain control.  Neurosurgery was consulted    Assessment/Plan:   Cough -Resolved - Patient has been coughing intermittently - He is at risk of aspiration as he is eating while laying in the bed on his right side - I explained to the patient that he should try to sit up and eat; but patient is adamant to eat while laying in the bed, as sitting up worsens his back pain - Currently not requiring oxygen  - Will continue to monitor  Vomiting/diarrhea -Resolved - Unclear etiology - X-ray abdomen shows gaseous distention - CT abdomen/pelvis did not show any significant abnormality  Hypotension - Patient says that he has not been getting opioids due to soft blood pressure - Amlodipine  was stopped 2 days ago - No clear etiology; no signs of infection; no dehydration -Has adequate p.o. intake - Continue midodrine 2.5 mg p.o. twice daily for 4 doses - Will continue to monitor   Intractable back pain -Improving with starting on Toradol  and increasing gabapentin  dose - Presented with worsening back pain - MRI of lumbar spine showed L4-5, new left foraminal disc extrusion with severe increased left neuroforaminal stenosis. - Neurosurgery reviewed MRI and recommended pain control, steroids, if pain not controlled plan for left L4-5 transforaminal epidural steroid  injection -Continues to have back pain, dose of  oxycodone  increased to 10 mg every 4 hours as needed, increased gabapentin  to 300 mg p.o. twice daily.  Continue Robaxin  750 mg p.o. 3 times daily, scheduled.  Continue Lidoderm  patch - Patient has been on aspirin  and Plavix  which need to be held for 5 days - Aspirin  Plavix  held on 10/29; has been more than 5 days since aspirin  and Plavix  were held.  IR consulted -Patient was supposed to get epidural steroid injection today however IR PA called and said that patient does not have adequate epidural fat for the injection.  He will be referred to outpatient neuroradiology for epidural steroid injection. 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  Obstructive sleep apnea - Not on CPAP  Cancer of upper lobe of right lung - Receiving radiation treatment - Alk phos is normal - CT ruled out osseous metastasis  Paroxysmal atrial fibrillation - Not on anticoagulation - Rate is controlled, continue telemetry  Severe COPD Chronic respiratory failure with hypoxemia -Continue home oxygen  2 L/min - Continue Breztri  Hypertension - Blood pressure has been  soft in the hospital, amlodipine  5 mg has been discontinued - Continue to monitor patient's blood pressure in the hospital    DVT prophylaxis: Lovenox  Medications     bisacodyl  10 mg Rectal Once   budesonide-glycopyrrolate -formoterol   2 puff Inhalation BID   docusate sodium   100 mg Oral BID   ezetimibe    10 mg Oral Daily   feeding supplement  237 mL Oral BID BM   gabapentin   300 mg Oral TID   methocarbamol   750 mg Oral TID   metoCLOPramide  (REGLAN ) injection  10 mg Intravenous Q6H   pantoprazole   40 mg Oral Daily   pravastatin   40 mg Oral Daily   senna  2 tablet Oral QHS     Data Reviewed:   CBG:  No results for input(s): GLUCAP in the last 168 hours.  SpO2: 100 % O2 Flow Rate (L/min): 2 L/min FiO2 (%): 32 %    Vitals:   05/03/24 0518 05/03/24 0806 05/03/24 0811 05/03/24 1225  BP: (!) 143/62  (!) 97/54 (!) 128/54  Pulse: (!) 56 60 72 77  Resp:  16 18 18   Temp: 97.8 F (36.6 C)  97.6 F (36.4 C) 98.9 F (37.2 C)  TempSrc:   Oral   SpO2: 100% 100% 99% 100%  Weight:      Height:          Data Reviewed:  Basic Metabolic Panel: Recent Labs  Lab 04/27/24 0338 04/30/24 0901  NA 136  --   K 4.2  --   CL 98  --   CO2 29  --   GLUCOSE 118*  --   BUN 32*  --   CREATININE 1.06 1.16  CALCIUM 9.2  --   MG 2.4  --     CBC: Recent Labs  Lab 04/27/24 0338  WBC 10.5  HGB 12.5*  HCT 35.9*  MCV 94.0  PLT 186    LFT Recent Labs  Lab 04/27/24 0338  AST 26  ALT 37  ALKPHOS 46  BILITOT 0.6  PROT 6.3*  ALBUMIN  3.2*     Antibiotics: Anti-infectives (From admission, onward)    Start     Dose/Rate Route Frequency Ordered Stop   04/17/24 2000  azithromycin (ZITHROMAX) tablet 500 mg        500 mg Oral Every 24 hours 04/17/24 1413 04/19/24 1957   04/16/24 2000  cefTRIAXone (ROCEPHIN) 2 g in sodium chloride  0.9 % 100 mL IVPB        2 g 200 mL/hr over 30 Minutes Intravenous Every 24 hours 04/16/24 0037 04/19/24 2028   04/16/24 2000  azithromycin (ZITHROMAX) 500 mg in sodium chloride  0.9 % 250 mL IVPB  Status:  Discontinued        500 mg 250 mL/hr over 60 Minutes Intravenous Every 24 hours 04/16/24 0037 04/17/24 1413   04/15/24 2145  cefTRIAXone (ROCEPHIN) 1 g in sodium chloride  0.9 % 100 mL IVPB        1 g 200 mL/hr over 30 Minutes Intravenous  Once  04/15/24 2144 04/16/24 0028   04/15/24 2145  azithromycin (ZITHROMAX) 500 mg in sodium chloride  0.9 % 250 mL IVPB        500 mg 250 mL/hr over 60 Minutes Intravenous  Once 04/15/24 2144 04/16/24 0028        CONSULTS neurosurgery  Code Status: Full code  Family Communication: Discussed with patient's daughter at bedside     Subjective     Objective  Physical Examination:     Status is: Inpatient:             Corey Tate Brod   [facility] If 7PM-7AM, please contact night-coverage at [email or web address], Office  [phone]   05/03/2024, 12:28 PM  LOS: 17 days

## 2024-05-03 NOTE — TOC Transition Note (Signed)
 Transition of Care [facility]) - Discharge Note   Patient Details  Name: Corey Tate MRN: [id number] Date of Birth: May 28, 1941  Transition of Care [facility]) CM/SW Contact:  Rosaline JONELLE Joe, RN Phone Number: 05/03/2024, 2:58 PM   Clinical Narrative:    Cm met with the patient and daughter at the bedside.  Patient plans to return home with his wife.  Patient was updated that he was set up with [facility] for home health services.  The daughter states that the patient has rolator and RW at the home and needs no other equipment.  Patient is providing transportation to home by car today.     Barriers to Discharge: No SNF bed   Patient Goals and CMS Choice     Choice offered to / list presented to : Patient, Adult Children, Spouse      Discharge Placement                       Discharge Plan and Services Additional resources added to the After Visit Summary for                                       Social Drivers of Health (SDOH) Interventions SDOH Screenings   Food Insecurity: No Food Insecurity (04/16/2024)  Housing: Low Risk  (04/16/2024)  Transportation Needs: No Transportation Needs (04/16/2024)  Utilities: Not At Risk (04/16/2024)  Social Connections: Unknown (04/16/2024)  Tobacco Use: High Risk (05/02/2024)     Readmission Risk Interventions    04/18/2024   11:29 AM  Readmission Risk Prevention Plan  Post Dischage Appt Complete  Medication Screening Complete  Transportation Screening Complete

## 2024-05-03 NOTE — Evaluation (Signed)
 RT Evaluate and Treat Note  05/03/2024   Breathing is (select one): Same as normal    The following was found on auscultation (select multiple):  Bilateral Breath Sounds: Diminished (05/03/24 0806)  R Upper  Breath Sounds: Diminished (05/03/24 0806) L Upper Breath Sounds: Diminished (05/03/24 0806) R Lower Breath Sounds: Diminished (05/03/24 0806) L Lower Breath Sounds: Diminished (05/03/24 0806)    Cough Assessment: Cough: Congested (05/03/24 0806)    Most Recent Chest Xray:... (No results found.    The following medications and/or interventions were ordered/changed/discontinued as part of the Respiratory Treatment protocol:   Medication Changes: None   Airway Clearance Changes: No Change   Oxygen  Therapy Changes:No Change

## 2024-05-03 NOTE — Progress Notes (Signed)
 Nutrition Follow-up  DOCUMENTATION CODES:   Severe malnutrition in context of chronic illness  INTERVENTION:  Continue Regular diet  Encourage PO intake  Continue Ensure Plus High Protein po BID, each supplement provides 350 kcal and 20 grams of protein.     NUTRITION DIAGNOSIS:   Moderate Malnutrition related to chronic illness as evidenced by energy intake < or equal to 75% for > or equal to 1 month, moderate fat depletion, severe muscle depletion. - ongoing    GOAL:   Patient will meet greater than or equal to 90% of their needs - met with PO intake   MONITOR:   PO intake, Supplement acceptance  REASON FOR ASSESSMENT:   Consult Diet education, Poor PO  ASSESSMENT:   PMHx:HTN, severe COPD, PVD s/p left CEA and right EIA angioplasty, active smoker, hx lung CA on radiation, OSA on 2L home O2 no CPAP. Presented with severe unrelenting back pain.  11/10 -  Decompressive lumbar extraforaminal decompression with transpedicular microdiscectomy with resection of large left L4-5 foraminal disc herniation, Posterior fixation L4-5, Intertransverse arthrodesis L4-5 bilaterally,Exploration of fusion L3-4 L4-5 with removal of segmental fixation L 3-5   Patient seen in room, finishing up a chocolate ensure. Day one post op with neurosurgery. He reports PO intake has been improving and that he ate 100% of breakfast this morning. Pt drinking 1-2 ensure per day, prefers chocolate flavor. Pt requesting PRN roxicodone , RN notified.   Admit weight: 62.5 kg  Current weight: 62.5 kg  Nutritionally Relevant Medications: Colace, protonix , miralax   Labs Reviewed: Na 134, Glu 196, BUN 42   NUTRITION - FOCUSED PHYSICAL EXAM:  Flowsheet Row Most Recent Value  Orbital Region Moderate depletion  Upper Arm Region Severe depletion  Thoracic and Lumbar Region Moderate depletion  Buccal Region Moderate depletion  Temple Region Severe depletion  Clavicle Bone Region Severe depletion  Clavicle  and Acromion Bone Region Severe depletion  Scapular Bone Region Moderate depletion  Dorsal Hand Moderate depletion  Patellar Region Severe depletion  Anterior Thigh Region Severe depletion  Posterior Calf Region Moderate depletion  Hair Reviewed  Eyes Reviewed  Mouth Reviewed  Skin Reviewed  Nails Reviewed    Diet Order:   Diet Order             Diet regular Room service appropriate? Yes; Fluid consistency: Thin  Diet effective now                   EDUCATION NEEDS:   No education needs have been identified at this time  Skin:  Skin Assessment: Reviewed RN Assessment  Last BM:  11/09  Height:   Ht Readings from Last 1 Encounters:  04/16/24 5' 10.98 (1.803 m)    Weight:   Wt Readings from Last 1 Encounters:  04/16/24 62.5 kg     BMI:  Body mass index is 19.23 kg/m.  Estimated Nutritional Needs:   Kcal:  1875-2200 kcal  Protein:  75-95 grams  Fluid:  1.8-2.2L/d  Madalyn Potters, MS, RD, LDN Clinical Dietitian  Contact via secure chat. If unavailable, use group chat RD Inpatient.

## 2024-05-11 ENCOUNTER — Encounter (HOSPITAL_COMMUNITY): Payer: Self-pay | Admitting: Neurological Surgery

## 2024-06-01 ENCOUNTER — Encounter: Payer: Self-pay | Admitting: Vascular Surgery

## 2024-06-01 ENCOUNTER — Ambulatory Visit: Attending: Vascular Surgery | Admitting: Vascular Surgery

## 2024-06-01 VITALS — BP 126/82 | HR 75 | Temp 98.1°F | Ht 70.0 in | Wt 137.0 lb

## 2024-06-01 DIAGNOSIS — I7143 Infrarenal abdominal aortic aneurysm, without rupture: Secondary | ICD-10-CM | POA: Insufficient documentation

## 2024-06-01 DIAGNOSIS — I6523 Occlusion and stenosis of bilateral carotid arteries: Secondary | ICD-10-CM | POA: Diagnosis not present

## 2024-06-01 DIAGNOSIS — I70203 Unspecified atherosclerosis of native arteries of extremities, bilateral legs: Secondary | ICD-10-CM | POA: Diagnosis present

## 2024-06-01 NOTE — Progress Notes (Signed)
 Patient ID: Corey Tate, male   DOB: 1941/01/01, 83 y.o.   MRN: [id number]  Reason for Consult: New Patient (Initial Visit)   Referred by Silvano Angeline FALCON, NP  Subjective:     HPI:  Corey Tate is a 83 y.o. male with history of left carotid endarterectomy 2 years ago and also has right SFA stenting for claudication all previously performed at [facility].  He also has a known abdominal aortic aneurysm.  He is a current everyday smoker as is his wife.  He did have a small toe wound on the right small toe but this healed with wound care.  He is now here for second opinion regarding treatment of his vascular disease.  Walks with the help of a walker does have cool feeling in his right foot no current ulceration.  No history of stroke, TIA or amaurosis and no new back or abdominal pain.  He has a recent history of spine surgery complicated by extended hospitalization and since that time has left greater than right lower extremity swelling which is his chief complaint today.  Past Medical History:  Diagnosis Date   Abdominal aortic aneurysm (AAA) without rupture 05/28/2023   Abnormal cardiac CT angiography 01/31/2020   Anxiety 12/11/2017   Arthritis    Asthma    ?   Atherosclerosis of native artery of both lower extremities 01/31/2016   Atrial flutter by electrocardiogram (HCC) 12/10/2016   Bilateral carotid artery stenosis 01/31/2016   CAD (coronary artery disease)    a. 01/2020- DFR-guided CSI orbital atherectomy with DES PCI of proximal LAD with staged orbital atherectomy and DES PCI of the mid and distal LCx.   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   GERD (gastroesophageal reflux disease)    H/O varicella 12/11/2017   History of atrial fibrillation 12/10/2016   HTN (hypertension) 02/08/2020   Hypertension    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   Lumbar spondylosis 11/06/2014   Neck mass 07/15/2022   Nicotine dependence, uncomplicated 09/26/2015   Nocturnal hypoxemia 03/04/2016   PAF (paroxysmal atrial fibrillation) (HCC) 12/10/2016   Peripheral vascular disease    Pneumonia 2021   S/P lumbar fusion 10/20/2022   S/P lumbar laminectomy 10/21/2022   Sebaceous cyst 07/15/2022   Shortness of breath    USES OXYGEN  AT NIGHT--HX OF RIGHT LOWER LOBE PULMONARY NODULE--FOLLOWED BY PT'S MEDICAL DOCTOR AND HAS HAD FOR YEARS   Smoking greater than 40 pack years 03/04/2016   Spinal stenosis of lumbar region 10/02/2014   Spondylolisthesis 10/16/2014   Syncope 08/04/2016   Family History  Problem Relation Age of Onset   Cancer Father    Diabetes Sister    Diabetes Brother    Past Surgical History:  Procedure Laterality Date   ATHERECTOMY  01/31/2020   Successful DFR guided, CSI Orbital Atherectomy-DES PCI of proximal LA   BACK SURGERY     LOWER BACK SURGERY X  3 - FUSION   CARPAL TUNNEL RELEASE AND SURGERY LEFT ELBOW     CATARACT EXTRACTION     CERVICAL DISC SURGERY     FUSION - ONLY SLIGHT LIMITATION IN NECK MOVEMENT   CORONARY ATHERECTOMY N/A 01/31/2020   Procedure: CORONARY ATHERECTOMY;  Surgeon: Anner Alm ORN, MD;  Location: [facility] INVASIVE CV LAB;  Service: Cardiovascular;  Laterality: N/A;   CORONARY ATHERECTOMY N/A 02/08/2020   Procedure: CORONARY ATHERECTOMY;  Surgeon: Anner Alm ORN, MD;  Location: [facility] INVASIVE CV LAB;  Service: Cardiovascular;  Laterality: N/A;   CORONARY PRESSURE/FFR STUDY N/A  01/31/2020   Procedure: INTRAVASCULAR PRESSURE WIRE/FFR STUDY;  Surgeon: Anner Alm ORN, MD;  Location: [facility] INVASIVE CV LAB;  Service: Cardiovascular;  Laterality: N/A;   CORONARY STENT INTERVENTION N/A 01/31/2020   Procedure: CORONARY STENT INTERVENTION;  Surgeon: Anner Alm ORN, MD;  Location: [facility] INVASIVE CV LAB;  Service: Cardiovascular;  Laterality: N/A;   LAMINECTOMY WITH POSTERIOR LATERAL ARTHRODESIS LEVEL 2 N/A 05/02/2024   Procedure: LAMINECTOMY WITH POSTERIOR LATERAL ARTHRODESIS LUMBAR FOUR-FIVE; EXPLORATION OF LUMBAR THREE-FIVE;  Surgeon: Joshua Alm Hamilton, MD;  Location: [facility] OR;  Service: Neurosurgery;  Laterality: N/A;  Lumbar re-exploration of fusion L3-L5, L4-5 diskectomy with instrumented fusion L4-5   LAMINECTOMY WITH POSTERIOR LATERAL ARTHRODESIS LEVEL 3 Bilateral 10/20/2022   Procedure: Laminectomy and Foraminotomy - Lumbar two-Lumbar three - bilateral - Lumbar three-Lumbar four - right - Lumbar four-Lumbar five - right, posterolateral fusion Lumbar three-five with pedicle screw fixation Lumbar three-five;  Surgeon: Joshua Alm RAMAN, MD;  Location: [facility] OR;  Service: Neurosurgery;  Laterality: Bilateral;   LEFT HEART CATH N/A 02/08/2020   Procedure: Left Heart Cath;  Surgeon: Anner Alm ORN, MD;  Location: [facility] INVASIVE CV LAB;  Service: Cardiovascular;  Laterality: N/A;   LEFT HEART CATH AND CORONARY ANGIOGRAPHY N/A 01/31/2020   Procedure: LEFT HEART CATH AND CORONARY ANGIOGRAPHY;  Surgeon: Anner Alm ORN, MD;  Location: [facility] INVASIVE CV LAB;  Service: Cardiovascular;  Laterality: N/A;   POSTERIOR CERVICAL FUSION/FORAMINOTOMY Bilateral 09/30/2012   Procedure: CERVICAL SEVEN AND THORACIC ONE BILATERAL POSTERIOR CERVICAL FUSION/FORAMINOTOMY ;  Surgeon: Alm RAMAN Joshua, MD;  Location: [facility] NEURO ORS;  Service: Neurosurgery;  Laterality: Bilateral;   RIGHT SHOULDER SURGERY     SINUS SURGERY WITH INSTATRAK     TOTAL HIP ARTHROPLASTY  04/02/2012   Procedure: TOTAL HIP ARTHROPLASTY ANTERIOR  APPROACH;  Surgeon: Lonni CINDERELLA Poli, MD;  Location: [facility] ORS;  Service: Orthopedics;  Laterality: Right;  Right Total Hip Arthroplasty   VASCULAR SURGERY     STENT PLACEMENT RIGHT LEG AND ROTOR ROOTER LEFT LEG    Short Social History:  Social History   Tobacco Use   Smoking status: Some Days    Current packs/day: 1.00    Average packs/day: 1 pack/day for 55.0 years (55.0 ttl pk-yrs)    Types: Cigarettes   Smokeless tobacco: Never  Substance Use Topics   Alcohol use: Yes    Alcohol/week: 21.0 standard drinks of alcohol    Types: 21 Cans of beer per week    Comment: 3 BEERS A DAY    Allergies  Allergen Reactions   Ferra-Caps [Iron] Hives   Streptomycin Hives and Itching    Current Outpatient Medications  Medication Sig Dispense Refill   albuterol  (PROVENTIL ) (2.5 MG/3ML) 0.083% nebulizer solution Take 2.5 mg by nebulization every 6 (six) hours as needed for shortness of breath or wheezing.     aspirin  EC 81 MG tablet Take 81 mg by mouth  daily.     BREZTRI  AEROSPHERE 160-9-4.8 MCG/ACT AERO Inhale 2 puffs into the lungs 2 (two) times daily.     chlorhexidine  (HIBICLENS ) 4 % external liquid Apply 15 mLs (1 Application total) topically as directed for 30 doses. Use as directed daily for 5 days every other week for 6 weeks. 946 mL 1   clopidogrel  (PLAVIX ) 75 MG tablet Take 1 tablet (75 mg total) by mouth daily. Starting Plavix  from 05/08/2024     docusate sodium  (COLACE) 100 MG capsule Take 100 mg by mouth daily as needed (constipation).     ezetimibe  (ZETIA ) 10 MG tablet Take 10 mg by mouth daily.     gabapentin  (NEURONTIN ) 300 MG capsule Take 300 mg by mouth 2 (two) times daily.     lansoprazole (PREVACID) 15 MG capsule Take 15 mg by mouth daily at 12 noon.     methocarbamol  (ROBAXIN ) 500 MG tablet Take 1 tablet (500 mg total) by mouth every 6 (six) hours as needed for muscle spasms. 60 tablet 1   mupirocin  ointment (BACTROBAN ) 2 % Place 1 Application into the nose 2 (two)  times daily for 60 doses. Use as directed 2 times daily for 5 days every other week for 6 weeks. 66 g 0   nitroGLYCERIN  (NITROSTAT ) 0.4 MG SL tablet Place 0.4 mg under the tongue every 5 (five) minutes as needed for chest pain.     Omega-3 Fatty Acids (FISH OIL) 1000 MG CAPS Take 1,000 mg by mouth daily.     oxyCODONE  (OXY IR/ROXICODONE ) 5 MG immediate release tablet Take 1 tablet (5 mg total) by mouth every 6 (six) hours as needed for moderate pain (pain score 4-6). 15 tablet 0   OXYGEN  Inhale 2 L into the lungs at bedtime.     pravastatin  (PRAVACHOL ) 40 MG tablet Take 1 tablet (40 mg total) by mouth daily. 90 tablet 3   Vitamin D-Vitamin K (K2 PLUS D3 PO) Take 1 tablet by mouth daily.     No current facility-administered medications for this visit.    Review of Systems  Constitutional:  Constitutional negative. HENT: HENT negative.  Eyes: Eyes negative.  Cardiovascular: Positive for leg swelling.  GI: Gastrointestinal negative.  Musculoskeletal: Positive for leg pain.  Neurological: Neurological negative. Hematologic: Hematologic/lymphatic negative.  Psychiatric: Psychiatric negative.        Objective:  Objective   Vitals:   06/01/24 1352  BP: 126/82  Pulse: 75  Temp: 98.1 F (36.7 C)  SpO2: 92%  Weight: 137 lb (62.1 kg)  Height: 5' 10 (1.778 m)   Body mass index is 19.66 kg/m.  Physical Exam HENT:     Head: Normocephalic.     Nose: Nose normal.  Neck:     Vascular: No carotid bruit.     Comments: Well-healed left neck incision Cardiovascular:     Rate and Rhythm: Normal rate.     Pulses:          Femoral pulses are 0 on the right side and 0 on the left side. Abdominal:     General: Abdomen is flat.     Palpations: Abdomen is soft. There is no mass.  Skin:    Capillary Refill: Capillary refill delayed on the right Neurological:     General: No focal deficit present.     Mental Status: He is alert.     Data:   CT abdomen/pelvis IMPRESSION: 1. No  evidence of bowel obstruction. 2. Infrarenal abdominal aortic aneurysm measuring 3.7 cm with  mural thrombus and aortic calcifications. Recommend surveillance ultrasound in 3 years.  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  For swelling I have recommended gentle compression stockings.  He also needs diligent protection of his feet as revascularization would certainly be complicated given his multilevel highly calcific vascular disease.  I have recommended continued aspirin , Plavix  and statin and follow-up in 6 months with ABIs and carotid duplex.  Based on recent CT demonstrating aortic aneurysm diameter 3.7 cm this can be followed in 2 to 3 years.     Adalind Weitz C. Sheree, MD [facility] Office: [phone] Pager: [phone]

## 2024-06-02 ENCOUNTER — Other Ambulatory Visit: Payer: Self-pay | Admitting: *Deleted

## 2024-06-02 DIAGNOSIS — I6523 Occlusion and stenosis of bilateral carotid arteries: Secondary | ICD-10-CM

## 2024-06-02 DIAGNOSIS — I70203 Unspecified atherosclerosis of native arteries of extremities, bilateral legs: Secondary | ICD-10-CM

## 2024-11-30 ENCOUNTER — Ambulatory Visit: Admitting: Vascular Surgery

## 2024-11-30 ENCOUNTER — Ambulatory Visit (HOSPITAL_COMMUNITY)
